# Patient Record
Sex: Female | Born: 1970
Health system: Southern US, Community
[De-identification: ages and names within clinical notes are randomized; demographics above are authoritative.]

## PROBLEM LIST (undated history)

## (undated) DIAGNOSIS — U071 COVID-19: Secondary | ICD-10-CM

## (undated) DIAGNOSIS — M069 Rheumatoid arthritis, unspecified: Secondary | ICD-10-CM

## (undated) DIAGNOSIS — R1013 Epigastric pain: Secondary | ICD-10-CM

## (undated) DIAGNOSIS — J309 Allergic rhinitis, unspecified: Secondary | ICD-10-CM

## (undated) DIAGNOSIS — K589 Irritable bowel syndrome without diarrhea: Secondary | ICD-10-CM

## (undated) DIAGNOSIS — E7800 Pure hypercholesterolemia, unspecified: Secondary | ICD-10-CM

## (undated) DIAGNOSIS — S39012A Strain of muscle, fascia and tendon of lower back, initial encounter: Secondary | ICD-10-CM

## (undated) HISTORY — DX: Allergic rhinitis, unspecified: J30.9

## (undated) HISTORY — DX: Epigastric pain: R10.13

## (undated) HISTORY — PX: CARPAL TUNNEL RELEASE: SHX101

## (undated) HISTORY — DX: Rheumatoid arthritis, unspecified: M06.9

## (undated) HISTORY — DX: Pure hypercholesterolemia, unspecified: E78.00

## (undated) HISTORY — DX: Irritable bowel syndrome, unspecified: K58.9

## (undated) HISTORY — DX: Strain of muscle, fascia and tendon of lower back, initial encounter: S39.012A

---

## 1999-01-04 ENCOUNTER — Emergency Department (HOSPITAL_COMMUNITY): Admission: EM | Admit: 1999-01-04 | Discharge: 1999-01-04 | Payer: Self-pay | Admitting: Emergency Medicine

## 1999-04-02 ENCOUNTER — Encounter (INDEPENDENT_AMBULATORY_CARE_PROVIDER_SITE_OTHER): Payer: Self-pay | Admitting: Specialist

## 1999-04-02 ENCOUNTER — Ambulatory Visit (HOSPITAL_COMMUNITY): Admission: RE | Admit: 1999-04-02 | Discharge: 1999-04-02 | Payer: Self-pay | Admitting: Gastroenterology

## 1999-07-23 ENCOUNTER — Ambulatory Visit (HOSPITAL_BASED_OUTPATIENT_CLINIC_OR_DEPARTMENT_OTHER): Admission: RE | Admit: 1999-07-23 | Discharge: 1999-07-23 | Payer: Self-pay | Admitting: Orthopedic Surgery

## 2000-02-14 ENCOUNTER — Encounter: Admission: RE | Admit: 2000-02-14 | Discharge: 2000-02-14 | Payer: Self-pay | Admitting: Pulmonary Disease

## 2000-02-14 ENCOUNTER — Encounter: Payer: Self-pay | Admitting: Pulmonary Disease

## 2002-08-22 ENCOUNTER — Ambulatory Visit (HOSPITAL_COMMUNITY): Admission: RE | Admit: 2002-08-22 | Discharge: 2002-08-22 | Payer: Self-pay | Admitting: Obstetrics and Gynecology

## 2002-08-22 ENCOUNTER — Encounter: Payer: Self-pay | Admitting: Obstetrics and Gynecology

## 2002-09-01 ENCOUNTER — Ambulatory Visit (HOSPITAL_COMMUNITY): Admission: RE | Admit: 2002-09-01 | Discharge: 2002-09-01 | Payer: Self-pay | Admitting: Obstetrics and Gynecology

## 2002-09-01 ENCOUNTER — Encounter: Payer: Self-pay | Admitting: Obstetrics and Gynecology

## 2002-10-19 ENCOUNTER — Ambulatory Visit (HOSPITAL_COMMUNITY): Admission: RE | Admit: 2002-10-19 | Discharge: 2002-10-19 | Payer: Self-pay | Admitting: Obstetrics and Gynecology

## 2002-10-20 ENCOUNTER — Ambulatory Visit: Admission: RE | Admit: 2002-10-20 | Discharge: 2002-10-20 | Payer: Self-pay | Admitting: Obstetrics and Gynecology

## 2003-01-03 ENCOUNTER — Encounter: Payer: Self-pay | Admitting: Obstetrics and Gynecology

## 2003-01-03 ENCOUNTER — Encounter: Admission: RE | Admit: 2003-01-03 | Discharge: 2003-01-03 | Payer: Self-pay | Admitting: Obstetrics and Gynecology

## 2003-01-03 ENCOUNTER — Ambulatory Visit (HOSPITAL_COMMUNITY): Admission: RE | Admit: 2003-01-03 | Discharge: 2003-01-03 | Payer: Self-pay | Admitting: Obstetrics and Gynecology

## 2003-01-06 ENCOUNTER — Encounter: Admission: RE | Admit: 2003-01-06 | Discharge: 2003-01-06 | Payer: Self-pay | Admitting: Obstetrics and Gynecology

## 2003-01-10 ENCOUNTER — Encounter: Admission: RE | Admit: 2003-01-10 | Discharge: 2003-01-10 | Payer: Self-pay | Admitting: Obstetrics and Gynecology

## 2003-01-12 ENCOUNTER — Encounter: Admission: RE | Admit: 2003-01-12 | Discharge: 2003-01-12 | Payer: Self-pay | Admitting: Obstetrics and Gynecology

## 2003-01-13 ENCOUNTER — Inpatient Hospital Stay (HOSPITAL_COMMUNITY): Admission: AD | Admit: 2003-01-13 | Discharge: 2003-01-16 | Payer: Self-pay | Admitting: Obstetrics and Gynecology

## 2003-01-13 ENCOUNTER — Encounter (INDEPENDENT_AMBULATORY_CARE_PROVIDER_SITE_OTHER): Payer: Self-pay

## 2005-08-01 ENCOUNTER — Ambulatory Visit: Payer: Self-pay | Admitting: Pulmonary Disease

## 2005-08-27 ENCOUNTER — Ambulatory Visit: Payer: Self-pay | Admitting: Pulmonary Disease

## 2005-10-01 ENCOUNTER — Ambulatory Visit: Payer: Self-pay | Admitting: Pulmonary Disease

## 2005-10-01 ENCOUNTER — Observation Stay (HOSPITAL_COMMUNITY): Admission: AD | Admit: 2005-10-01 | Discharge: 2005-10-02 | Payer: Self-pay | Admitting: Pulmonary Disease

## 2005-10-02 ENCOUNTER — Ambulatory Visit: Payer: Self-pay | Admitting: Pulmonary Disease

## 2005-10-15 ENCOUNTER — Ambulatory Visit: Payer: Self-pay | Admitting: Pulmonary Disease

## 2006-06-05 ENCOUNTER — Ambulatory Visit: Payer: Self-pay | Admitting: Internal Medicine

## 2006-06-23 ENCOUNTER — Ambulatory Visit: Payer: Self-pay | Admitting: Pulmonary Disease

## 2006-07-01 ENCOUNTER — Ambulatory Visit: Payer: Self-pay | Admitting: Pulmonary Disease

## 2006-08-21 ENCOUNTER — Ambulatory Visit: Payer: Self-pay | Admitting: Pulmonary Disease

## 2006-08-21 LAB — CONVERTED CEMR LAB
ALT: 17 units/L (ref 0–40)
AST: 16 units/L (ref 0–37)
Albumin: 4.2 g/dL (ref 3.5–5.2)
Alkaline Phosphatase: 37 units/L — ABNORMAL LOW (ref 39–117)
BUN: 9 mg/dL (ref 6–23)
CO2: 27 meq/L (ref 19–32)
Calcium: 9 mg/dL (ref 8.4–10.5)
Chloride: 106 meq/L (ref 96–112)
Chol/HDL Ratio, serum: 3.5
Cholesterol: 216 mg/dL (ref 0–200)
Creatinine, Ser: 0.8 mg/dL (ref 0.4–1.2)
GFR calc non Af Amer: 87 mL/min
Glomerular Filtration Rate, Af Am: 105 mL/min/{1.73_m2}
Glucose, Bld: 94 mg/dL (ref 70–99)
HDL: 62.5 mg/dL (ref >39.0)
LDL DIRECT: 140.7 mg/dL
Potassium: 4.2 meq/L (ref 3.5–5.1)
Sodium: 139 meq/L (ref 135–145)
Total Bilirubin: 1.1 mg/dL (ref 0.3–1.2)
Total Protein: 6.6 g/dL (ref 6.0–8.3)
Triglyceride fasting, serum: 65 mg/dL (ref 0–149)
VLDL: 13 mg/dL (ref 0–40)

## 2006-08-26 ENCOUNTER — Ambulatory Visit: Payer: Self-pay | Admitting: Pulmonary Disease

## 2007-07-13 ENCOUNTER — Ambulatory Visit: Payer: Self-pay | Admitting: Pulmonary Disease

## 2007-07-13 LAB — CONVERTED CEMR LAB
ALT: 19 units/L (ref 0–35)
AST: 25 units/L (ref 0–37)
Albumin: 3.8 g/dL (ref 3.5–5.2)
Alkaline Phosphatase: 32 units/L — ABNORMAL LOW (ref 39–117)
BUN: 9 mg/dL (ref 6–23)
CO2: 26 meq/L (ref 19–32)
Calcium: 8.9 mg/dL (ref 8.4–10.5)
Chloride: 107 meq/L (ref 96–112)
Cholesterol: 204 mg/dL (ref 0–200)
Creatinine, Ser: 0.7 mg/dL (ref 0.4–1.2)
Direct LDL: 123.2 mg/dL
GFR calc Af Amer: 122 mL/min
GFR calc non Af Amer: 101 mL/min
Glucose, Bld: 100 mg/dL — ABNORMAL HIGH (ref 70–99)
HDL: 71.4 mg/dL (ref >39.0)
Potassium: 4.2 meq/L (ref 3.5–5.1)
Sodium: 140 meq/L (ref 135–145)
Total Bilirubin: 0.7 mg/dL (ref 0.3–1.2)
Total CHOL/HDL Ratio: 2.9
Total Protein: 6.8 g/dL (ref 6.0–8.3)
Triglycerides: 72 mg/dL (ref 0–149)
VLDL: 14 mg/dL (ref 0–40)

## 2007-12-25 ENCOUNTER — Encounter: Payer: Self-pay | Admitting: Pulmonary Disease

## 2007-12-25 DIAGNOSIS — K589 Irritable bowel syndrome without diarrhea: Secondary | ICD-10-CM | POA: Insufficient documentation

## 2007-12-25 DIAGNOSIS — K5289 Other specified noninfective gastroenteritis and colitis: Secondary | ICD-10-CM | POA: Insufficient documentation

## 2008-01-21 ENCOUNTER — Ambulatory Visit: Payer: Self-pay | Admitting: Internal Medicine

## 2008-04-24 ENCOUNTER — Ambulatory Visit: Payer: Self-pay | Admitting: Pulmonary Disease

## 2008-04-25 LAB — CONVERTED CEMR LAB
ALT: 16 units/L (ref 0–35)
AST: 20 units/L (ref 0–37)
Albumin: 3.9 g/dL (ref 3.5–5.2)
Alkaline Phosphatase: 29 units/L — ABNORMAL LOW (ref 39–117)
BUN: 7 mg/dL (ref 6–23)
Basophils Absolute: 0.1 10*3/uL (ref 0.0–0.1)
Basophils Relative: 0.6 % (ref 0.0–3.0)
Bilirubin, Direct: 0.1 mg/dL (ref 0.0–0.3)
CO2: 28 meq/L (ref 19–32)
Calcium: 9.1 mg/dL (ref 8.4–10.5)
Chloride: 111 meq/L (ref 96–112)
Creatinine, Ser: 0.7 mg/dL (ref 0.4–1.2)
Eosinophils Absolute: 0 10*3/uL (ref 0.0–0.7)
Eosinophils Relative: 0.4 % (ref 0.0–5.0)
GFR calc Af Amer: 122 mL/min
GFR calc non Af Amer: 101 mL/min
Glucose, Bld: 90 mg/dL (ref 70–99)
HCT: 38.2 % (ref 36.0–46.0)
Hemoglobin: 13.4 g/dL (ref 12.0–15.0)
Lymphocytes Relative: 18.6 % (ref 12.0–46.0)
MCHC: 35.2 g/dL (ref 30.0–36.0)
MCV: 92.5 fL (ref 78.0–100.0)
Monocytes Absolute: 0.5 10*3/uL (ref 0.1–1.0)
Monocytes Relative: 5.4 % (ref 3.0–12.0)
Neutro Abs: 7.3 10*3/uL (ref 1.4–7.7)
Neutrophils Relative %: 75 % (ref 43.0–77.0)
Platelets: 263 10*3/uL (ref 150–400)
Potassium: 4.1 meq/L (ref 3.5–5.1)
RBC: 4.13 M/uL (ref 3.87–5.11)
RDW: 11.5 % (ref 11.5–14.6)
Sodium: 141 meq/L (ref 135–145)
Total Bilirubin: 0.8 mg/dL (ref 0.3–1.2)
Total Protein: 6.7 g/dL (ref 6.0–8.3)
WBC: 9.7 10*3/uL (ref 4.5–10.5)

## 2009-02-19 ENCOUNTER — Ambulatory Visit: Payer: Self-pay | Admitting: Pulmonary Disease

## 2009-05-09 ENCOUNTER — Ambulatory Visit: Payer: Self-pay | Admitting: Internal Medicine

## 2009-05-09 DIAGNOSIS — J069 Acute upper respiratory infection, unspecified: Secondary | ICD-10-CM | POA: Insufficient documentation

## 2009-05-10 LAB — CONVERTED CEMR LAB: Streptococcus, Group A Screen (Direct): NEGATIVE

## 2009-05-15 DIAGNOSIS — J309 Allergic rhinitis, unspecified: Secondary | ICD-10-CM | POA: Insufficient documentation

## 2009-07-12 ENCOUNTER — Ambulatory Visit: Payer: Self-pay | Admitting: Adult Health

## 2009-07-13 LAB — CONVERTED CEMR LAB
ALT: 24 units/L (ref 0–35)
AST: 24 units/L (ref 0–37)
Albumin: 4.5 g/dL (ref 3.5–5.2)
Alkaline Phosphatase: 42 units/L (ref 39–117)
BUN: 11 mg/dL (ref 6–23)
Basophils Absolute: 0 10*3/uL (ref 0.0–0.1)
Basophils Relative: 0.1 % (ref 0.0–3.0)
Bilirubin Urine: NEGATIVE
Bilirubin, Direct: 0.1 mg/dL (ref 0.0–0.3)
CO2: 26 meq/L (ref 19–32)
Calcium: 9.3 mg/dL (ref 8.4–10.5)
Chloride: 105 meq/L (ref 96–112)
Cholesterol: 192 mg/dL (ref 0–200)
Creatinine, Ser: 0.8 mg/dL (ref 0.4–1.2)
Eosinophils Absolute: 0.1 10*3/uL (ref 0.0–0.7)
Eosinophils Relative: 0.9 % (ref 0.0–5.0)
GFR calc non Af Amer: 85.24 mL/min (ref >60)
Glucose, Bld: 94 mg/dL (ref 70–99)
HCT: 38.8 % (ref 36.0–46.0)
HDL: 60.3 mg/dL (ref >39.00)
Hemoglobin: 13.5 g/dL (ref 12.0–15.0)
Ketones, ur: NEGATIVE mg/dL
LDL Cholesterol: 120 mg/dL — ABNORMAL HIGH (ref 0–99)
Leukocytes, UA: NEGATIVE
Lymphocytes Relative: 14.2 % (ref 12.0–46.0)
Lymphs Abs: 1.2 10*3/uL (ref 0.7–4.0)
MCHC: 34.7 g/dL (ref 30.0–36.0)
MCV: 96.7 fL (ref 78.0–100.0)
Monocytes Absolute: 0.5 10*3/uL (ref 0.1–1.0)
Monocytes Relative: 5.4 % (ref 3.0–12.0)
Neutro Abs: 6.9 10*3/uL (ref 1.4–7.7)
Neutrophils Relative %: 79.4 % — ABNORMAL HIGH (ref 43.0–77.0)
Nitrite: NEGATIVE
Platelets: 208 10*3/uL (ref 150.0–400.0)
Potassium: 4.3 meq/L (ref 3.5–5.1)
RBC: 4.01 M/uL (ref 3.87–5.11)
RDW: 11.5 % (ref 11.5–14.6)
Sodium: 139 meq/L (ref 135–145)
Specific Gravity, Urine: 1.02 (ref 1.000–1.030)
TSH: 1.15 microintl units/mL (ref 0.35–5.50)
Total Bilirubin: 1.2 mg/dL (ref 0.3–1.2)
Total CHOL/HDL Ratio: 3
Total Protein, Urine: NEGATIVE mg/dL
Total Protein: 7.1 g/dL (ref 6.0–8.3)
Triglycerides: 59 mg/dL (ref 0.0–149.0)
Urine Glucose: NEGATIVE mg/dL
Urobilinogen, UA: 0.2 (ref 0.0–1.0)
VLDL: 11.8 mg/dL (ref 0.0–40.0)
WBC: 8.7 10*3/uL (ref 4.5–10.5)
pH: 5 (ref 5.0–8.0)

## 2009-11-10 ENCOUNTER — Emergency Department (HOSPITAL_BASED_OUTPATIENT_CLINIC_OR_DEPARTMENT_OTHER): Admission: EM | Admit: 2009-11-10 | Discharge: 2009-11-10 | Payer: Self-pay | Admitting: Emergency Medicine

## 2010-05-14 ENCOUNTER — Ambulatory Visit: Payer: Self-pay | Admitting: Internal Medicine

## 2010-05-14 ENCOUNTER — Ambulatory Visit: Payer: Self-pay | Admitting: Pulmonary Disease

## 2010-05-14 DIAGNOSIS — S339XXA Sprain of unspecified parts of lumbar spine and pelvis, initial encounter: Secondary | ICD-10-CM | POA: Insufficient documentation

## 2010-05-14 DIAGNOSIS — E7800 Pure hypercholesterolemia, unspecified: Secondary | ICD-10-CM | POA: Insufficient documentation

## 2010-05-14 DIAGNOSIS — R1013 Epigastric pain: Secondary | ICD-10-CM

## 2010-05-14 DIAGNOSIS — S335XXA Sprain of ligaments of lumbar spine, initial encounter: Secondary | ICD-10-CM

## 2010-05-14 DIAGNOSIS — K3189 Other diseases of stomach and duodenum: Secondary | ICD-10-CM | POA: Insufficient documentation

## 2010-09-13 ENCOUNTER — Other Ambulatory Visit: Payer: Self-pay | Admitting: Pulmonary Disease

## 2010-09-13 ENCOUNTER — Ambulatory Visit
Admission: RE | Admit: 2010-09-13 | Discharge: 2010-09-13 | Payer: Self-pay | Source: Home / Self Care | Attending: Pulmonary Disease | Admitting: Pulmonary Disease

## 2010-09-13 LAB — HEPATIC FUNCTION PANEL
ALT: 17 U/L (ref 0–35)
AST: 19 U/L (ref 0–37)
Albumin: 4.3 g/dL (ref 3.5–5.2)
Alkaline Phosphatase: 38 U/L — ABNORMAL LOW (ref 39–117)
Bilirubin, Direct: 0.1 mg/dL (ref 0.0–0.3)
Total Bilirubin: 0.9 mg/dL (ref 0.3–1.2)
Total Protein: 6.6 g/dL (ref 6.0–8.3)

## 2010-09-13 LAB — BASIC METABOLIC PANEL
BUN: 11 mg/dL (ref 6–23)
CO2: 26 mEq/L (ref 19–32)
Calcium: 9.2 mg/dL (ref 8.4–10.5)
Chloride: 106 mEq/L (ref 96–112)
Creatinine, Ser: 0.7 mg/dL (ref 0.4–1.2)
GFR: 100.48 mL/min (ref >60.00)
Glucose, Bld: 86 mg/dL (ref 70–99)
Potassium: 4.6 mEq/L (ref 3.5–5.1)
Sodium: 139 mEq/L (ref 135–145)

## 2010-09-13 LAB — CBC WITH DIFFERENTIAL/PLATELET
Basophils Absolute: 0 10*3/uL (ref 0.0–0.1)
Basophils Relative: 0.9 % (ref 0.0–3.0)
Eosinophils Absolute: 0.1 10*3/uL (ref 0.0–0.7)
Eosinophils Relative: 1.4 % (ref 0.0–5.0)
HCT: 38.5 % (ref 36.0–46.0)
Hemoglobin: 13.4 g/dL (ref 12.0–15.0)
Lymphocytes Relative: 30.6 % (ref 12.0–46.0)
Lymphs Abs: 1.5 10*3/uL (ref 0.7–4.0)
MCHC: 34.9 g/dL (ref 30.0–36.0)
MCV: 95.3 fl (ref 78.0–100.0)
Monocytes Absolute: 0.4 10*3/uL (ref 0.1–1.0)
Monocytes Relative: 8.7 % (ref 3.0–12.0)
Neutro Abs: 2.9 10*3/uL (ref 1.4–7.7)
Neutrophils Relative %: 58.4 % (ref 43.0–77.0)
Platelets: 200 10*3/uL (ref 150.0–400.0)
RBC: 4.04 Mil/uL (ref 3.87–5.11)
RDW: 12.3 % (ref 11.5–14.6)
WBC: 4.9 10*3/uL (ref 4.5–10.5)

## 2010-09-13 LAB — TSH: TSH: 1.3 u[IU]/mL (ref 0.35–5.50)

## 2010-09-13 LAB — URINALYSIS
Bilirubin Urine: NEGATIVE
Hemoglobin, Urine: NEGATIVE
Ketones, ur: NEGATIVE
Leukocytes, UA: NEGATIVE
Nitrite: NEGATIVE
Specific Gravity, Urine: <=1.005 (ref 1.000–1.030)
Total Protein, Urine: NEGATIVE
Urine Glucose: NEGATIVE
Urobilinogen, UA: 0.2 (ref 0.0–1.0)
pH: 7 (ref 5.0–8.0)

## 2010-09-13 LAB — LIPID PANEL
Cholesterol: 185 mg/dL (ref 0–200)
HDL: 65.1 mg/dL (ref >39.00)
LDL Cholesterol: 111 mg/dL — ABNORMAL HIGH (ref 0–99)
Total CHOL/HDL Ratio: 3
Triglycerides: 46 mg/dL (ref 0.0–149.0)
VLDL: 9.2 mg/dL (ref 0.0–40.0)

## 2010-09-24 NOTE — Assessment & Plan Note (Signed)
Summary: cpx/jd   CC:  Yearly CPX...  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 I last saw Natalie Ford 2/07- good general health> hx AR, IBS, anxiety on no regular meds... she saw TP last yr- dong well, labs all normal x LDL= 120 on diet Rx alone... she has been running 2-71mi/d 4-5d per wk & weight down 5# to 122# (59" tall & BMI  ~25)... no new complaints or concerns; she is working on her BSN & will be completing her classes at [employer]...   Current Problems:   HEALTH MAINTENANCE EXAM (ICD-V70.0)  ~  GI:  she had a colonoscopy 2000 by DrStark because of diarrhea prob at that time... the colon was neg (sl tortuous) & rec for hi fiber diet.  ~  GYN:  DrHenley for PAP, Mammograms, etc...  ~  Immunizations:  she is up to date w/ Flu shot yearly thru [facility] & had Tetanus shot  ~2000 (she will inquire re 87yr update vaccination at work)...  ALLERGIC RHINITIS (ICD-477.9) - she takes OTC antihist Prn...  HYPERCHOLESTEROLEMIA, BORDERLINE (ICD-272.4) - prev on Lipitor Rx, she prefers to control w/ diet alone...  ~  FLP 1/07 showed TChol 244, TG 63, HDL 73, LDL 156... rec> Lip10 but pt stopped on her own.  ~  FLP 11/08 showed TChol 204, TG 72, HDL 71, LDL 123  ~  FLP 11/10 showed TChol 192, TG 59, HDL 60, LDL 120  ~  FLP 9/11 showed TChol   Hx of DYSPEPSIA (ICD-536.8) - she uses OTC H2 blockers Prn...  IRRITABLE BOWEL SYNDROME (ICD-564.1) - hx IBS w/ diarrhea & eval 2000 by DrStark> colonoscopy was neg x tortuous colon & rec for incr fiber & Bentyl Prn...  ~  she has had 2 severe gastroenteritis bouts one in 2007 w/ overnight hosp for IVF & the other in 2011 treated in ER similarly...  Hx of LUMBOSACRAL STRAIN (ICD-846.0)   Preventive Screening-Counseling & Management  Alcohol-Tobacco     Smoking Status: never  Allergies: 1)  ! Penicillin  Comments:  Nurse/Medical Assistant: The patient's medications and allergies were  reviewed with the patient and were updated in the Medication and Allergy Lists.  Past History:  Past Medical History: ALLERGIC RHINITIS (ICD-477.9) HYPERCHOLESTEROLEMIA, BORDERLINE (ICD-272.4) Hx of DYSPEPSIA (ICD-536.8) IRRITABLE BOWEL SYNDROME (ICD-564.1) Hx of LUMBOSACRAL STRAIN (ICD-846.0)  Family History: Reviewed history from 02/19/2009 and no changes required. mother is brodelrine diabetic, controls with diet, also has allergies, astham and heart disease and COPD sister has allergies, thyroid  father has leukemia, had a blood clot after back surgery PGF had stomach cancer  Social History: Reviewed history from 05/09/2009 and no changes required. never smoked positive for second hand smoke exposure reportedly exercises 4x week states drinks 1-2 cups caffeine a day married 2 children alcohol socially in school for BSN  Review of Systems  The patient denies fever, chills, sweats, anorexia, fatigue, weakness, malaise, weight loss, sleep disorder, blurring, diplopia, eye irritation, eye discharge, vision loss, eye pain, photophobia, earache, ear discharge, tinnitus, decreased hearing, nasal congestion, nosebleeds, sore throat, hoarseness, chest pain, palpitations, syncope, dyspnea on exertion, orthopnea, PND, peripheral edema, cough, dyspnea at rest, excessive sputum, hemoptysis, wheezing, pleurisy, nausea, vomiting, diarrhea, constipation, change in bowel habits, abdominal pain, melena, hematochezia, jaundice, gas/bloating, indigestion/heartburn, dysphagia, odynophagia, dysuria, hematuria, urinary frequency, urinary hesitancy, nocturia, incontinence, back pain, joint pain, joint swelling, muscle cramps, muscle weakness, stiffness, arthritis, sciatica,  restless legs, leg pain at night, leg pain with exertion, rash, itching, dryness, suspicious lesions, paralysis, paresthesias, seizures, tremors, vertigo, transient blindness, frequent falls, frequent headaches, difficulty walking,  depression, anxiety, memory loss, confusion, cold intolerance, heat intolerance, polydipsia, polyphagia, polyuria, unusual weight change, abnormal bruising, bleeding, enlarged lymph nodes, urticaria, allergic rash, hay fever, and recurrent infections.    Vital Signs:  Patient profile:   40 year old female Height:      59 inches Weight:      121.50 pounds BMI:     24.63 O2 Sat:      100 % on Room air Temp:     99.5 degrees F oral Pulse rate:   70 / minute BP sitting:   112 / 74  (left arm) Cuff size:   regular  Vitals Entered By: Randell Loop CMA (May 14, 2010 12:00 PM)  O2 Sat at Rest %:  100 O2 Flow:  Room air CC: Yearly CPX.. Is Patient Diabetic? No Pain Assessment Patient in pain? no      Comments meds updated today with pt   Physical Exam  Additional Exam:  WD, WN, 40 y/o WF in NAD... GENERAL:  Alert & oriented; pleasant & cooperative... HEENT:  [place]/AT, EOM-wnl, PERRLA, Fundi-benign, EACs-clear, TMs-wnl, NOSE-clear, THROAT-clear & wnl. NECK:  Supple w/ full ROM; no JVD; normal carotid impulses w/o bruits; no thyromegaly or nodules palpated; no lymphadenopathy. CHEST:  Clear to P & A; without wheezes/ rales/ or rhonchi. HEART:  Regular Rhythm; without murmurs/ rubs/ or gallops. ABDOMEN:  Soft & nontender; normal bowel sounds; no organomegaly or masses detected. EXT: without deformities or arthritic changes; no varicose veins/ venous insuffic/ or edema. NEURO:  CN's intact; motor testing normal; sensory testing normal; gait normal & balance OK. DERM:  No lesions noted; no rash etc...    CXR  Procedure date:  05/14/2010  Findings:      CHEST - 2 VIEW Comparison: 07/01/2006   Findings: Midline trachea.  Heart size upper limits of normal. Mediastinal contours otherwise within normal limits.  No pleural effusion or pneumothorax.  Clear lungs.   IMPRESSION:   1. No acute cardiopulmonary disease. 2.  Heart size upper limits of normal.   Read By:  Consuello Bossier,  M.D.   Impression & Recommendations:  Problem # 1:  HEALTH MAINTENANCE EXAM (ICD-V70.0) Good general health>> no perscription meds... Orders: 12 Lead EKG (12 Lead EKG) T-2 View CXR (71020TC)  Problem # 2:  ALLERGIC RHINITIS (ICD-477.9) Stable w/ OTC meds Prn... The following medications were removed from the medication list:    Claritin 10 Mg Tabs (Loratadine) .[name]... Take 1 tablet by mouth once a day as needed    Sudafed 30 Mg Tabs (Pseudoephedrine hcl) .[name]... Per package    Saline Nasal Spray 0.65 % Soln (Saline) .[name]... 2 sprays each nostril as needed  Problem # 3:  HYPERCHOLESTEROLEMIA, BORDERLINE (ICD-272.4) We discussed low chol/ low fat diet, etc...  Problem # 4:  IRRITABLE BOWEL SYNDROME (ICD-564.1) No recurrent symptoms at this time> hx diarrhea & we discussed fiber options...  Complete Medication List: 1)  Calcium Carbonate-vitamin D 600-400 Mg-unit Tabs (Calcium carbonate-vitamin d) .... Take 1 tablet by mouth once a day 2)  Multivitamins Tabs (Multiple vitamin) .... Take 1 tablet by mouth once a day 3)  Tylenol 325 Mg Tabs (Acetaminophen) .... Per bottle  Patient Instructions: 1)  Today we updated your med list- see below.... 2)  Today we did your follow up CXR &  EKG... 3)  Please return to our lab one morning for your FASTING blood work... then please call the "phone tree" in a few days for your lab results.Marland KitchenMarland Kitchen  4)  Natalie Ford, you look great- call for any problems.Marland KitchenMarland Kitchen 5)  Please schedule a follow-up appointment in 1 year.   Immunization History:  Influenza Immunization History:    Influenza:  historical (06/04/2009)

## 2010-09-30 ENCOUNTER — Telehealth (INDEPENDENT_AMBULATORY_CARE_PROVIDER_SITE_OTHER): Payer: Self-pay | Admitting: *Deleted

## 2010-10-10 NOTE — Progress Notes (Signed)
Summary: Test results  Phone Note Call from Patient Call back at Home Phone [phone]   Caller: Patient Reason for Call: Lab or Test Results Summary of Call: Patient wants lab test results. Initial call taken by: Leonette Monarch,  September 30, 2010 1:59 PM  Follow-up for Phone Call        Pt calling for lab results from 1/20- still unsigned so I printed them along with this msg. Pls advise thanks! Follow-up by: Vernie Murders,  September 30, 2010 3:06 PM  Additional Follow-up for Phone Call Additional follow up Details #1::        Per SN- Labs look great, mail her a copy.  I called pt and had to [facility]  September 30, 2010 4:02 PM     Additional Follow-up for Phone Call Additional follow up Details #2::    Spoke with pt and notified of the above per SN.  Pt verbalized understanding.  I verified her home address and mailed her a copy of labs.  Follow-up by: Vernie Murders,  September 30, 2010 4:22 PM

## 2010-11-18 LAB — CBC
HCT: 42.7 % (ref 36.0–46.0)
Hemoglobin: 14.8 g/dL (ref 12.0–15.0)
MCHC: 34.5 g/dL (ref 30.0–36.0)
MCV: 93.5 fL (ref 78.0–100.0)
Platelets: 211 10*3/uL (ref 150–400)
RBC: 4.57 MIL/uL (ref 3.87–5.11)
RDW: 11.8 % (ref 11.5–15.5)
WBC: 11 10*3/uL — ABNORMAL HIGH (ref 4.0–10.5)

## 2010-11-18 LAB — BASIC METABOLIC PANEL
BUN: 11 mg/dL (ref 6–23)
CO2: 29 mEq/L (ref 19–32)
Calcium: 9.6 mg/dL (ref 8.4–10.5)
Chloride: 105 mEq/L (ref 96–112)
Creatinine, Ser: 0.7 mg/dL (ref 0.4–1.2)
GFR calc Af Amer: >60 mL/min (ref >60)
GFR calc non Af Amer: >60 mL/min (ref >60)
Glucose, Bld: 99 mg/dL (ref 70–99)
Potassium: 3.9 mEq/L (ref 3.5–5.1)
Sodium: 144 mEq/L (ref 135–145)

## 2010-11-18 LAB — DIFFERENTIAL
Basophils Absolute: 0.1 10*3/uL (ref 0.0–0.1)
Basophils Relative: 1 % (ref 0–1)
Eosinophils Absolute: 0 10*3/uL (ref 0.0–0.7)
Eosinophils Relative: 0 % (ref 0–5)
Lymphocytes Relative: 6 % — ABNORMAL LOW (ref 12–46)
Lymphs Abs: 0.7 10*3/uL (ref 0.7–4.0)
Monocytes Absolute: 0.7 10*3/uL (ref 0.1–1.0)
Monocytes Relative: 7 % (ref 3–12)
Neutro Abs: 9.5 10*3/uL — ABNORMAL HIGH (ref 1.7–7.7)
Neutrophils Relative %: 86 % — ABNORMAL HIGH (ref 43–77)

## 2010-11-18 LAB — URINALYSIS, ROUTINE W REFLEX MICROSCOPIC
Glucose, UA: NEGATIVE mg/dL
Hgb urine dipstick: NEGATIVE
Ketones, ur: >80 mg/dL — AB
Leukocytes, UA: NEGATIVE
Nitrite: NEGATIVE
Protein, ur: 30 mg/dL — AB
Specific Gravity, Urine: 1.029 (ref 1.005–1.030)
Urobilinogen, UA: 1 mg/dL (ref 0.0–1.0)
pH: 6.5 (ref 5.0–8.0)

## 2010-11-18 LAB — URINE MICROSCOPIC-ADD ON

## 2010-11-18 LAB — PREGNANCY, URINE: Preg Test, Ur: NEGATIVE

## 2011-01-10 NOTE — Discharge Summary (Signed)
Ford, Natalie             ACCOUNT NO.:  [id number]   MEDICAL RECORD NO.:  [id number]          PATIENT TYPE:  OBV   LOCATION:  5703                         FACILITY:  [facility]   PHYSICIAN:  Lonzo Cloud. Kriste Basque, M.D. [facility] OF BIRTH:  [birth year]   DATE OF ADMISSION:  10/01/2005  DATE OF DISCHARGE:  10/02/2005                                 DISCHARGE SUMMARY   FINAL DIAGNOSES:  1.  Admitted October 01, 2005 for observation due to severe nausea, vomiting      and diarrhea. Evaluation reveals probable viral gastroenteritis with      slow resolution.  2.  History of mild hypercholesterolemia recently started on Lipitor      therapy.  3.  History of irritable bowel syndrome in the past with negative      colonoscopy in 2000.  4.  History of anxiety recently on Effexor with good control.   ALLERGIES:  PENICILLIN.   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 She presented with a one-day history of severe nausea, vomiting  and profuse watery diarrhea with extreme weakness, dizziness, malaise, etc.  She had dinner at a friend's house the night prior to admission, awoke in  the middle of the night with nausea, vomiting followed by profuse watery  diarrhea. She felt terrible all day with fever to 100, chills and malaise  and progressive weakness with postural symptoms. She was unable to keep  anything down and had continued diarrhea. She presented to the office where  blood pressure was 100/60, pulse 100 and regular. She was weak and quite  postural and was admitted for IV fluids and observation.   Dalia has enjoyed good general health. Her recent physical showed some mild  hypercholesterolemia. She was started on Lipitor. She has a history of  irritable bowel syndrome in the past with a negative colonoscopy in 2000.  She has been under some recent stress and was started on  Effexor with good  control. She is allergic to PENICILLIN. Her admission medications included  Lipitor 20 milligrams p.o. daily and Effexor 75 milligrams p.o. q.h.s.   PHYSICAL EXAMINATION:  Physical exam revealed a 40 year old white female who  was feeling quite ill. Blood pressure 98/60, pulse 120 and regular,  respirations 18 per minute and not labored, O2 sat 96% on room air,  temperature 100.5 orally. HEENT exam revealed dry mucous membranes. Neck  exam showed no jugular distension, no carotid bruits, no thyromegaly or  lymphadenopathy. Chest exam was clear to percussion and auscultation.  Cardiac exam revealed a regular rhythm. A grade 1/6 systolic ejection murmur  at the left sternal border. No rubs or gallops heard. The abdomen was soft  with minimal midabdominal and left lower quadrant tenderness. There was no  guarding or rebound. Bowel sounds were increased. Extremities showed no  cyanosis, clubbing or edema. Neurologic exam was intact. Skin exam was  negative.   LABORATORY DATA:  CBC showed hemoglobin 13.1, hematocrit 37.1, white count  9800 with 91% segs; platelet count  was 227,000. Sodium 137, potassium 3.4,  chloride 106, CO2 27, BUN 7, creatinine 0.7, blood sugar 135, bilirubin 1.0,  alk phos 51, SGOT 26, SGPT 26, total protein 6.0, albumin 3.7, calcium 8.5.  Sed rate was 7.   HOSPITAL COURSE:  The patient was admitted for IV fluid hydration and  symptomatic treatment. She was presumed to have a severe viral  gastroenteritis. She was given Phenergan for nausea, Lomotil for the  diarrhea and Tylenol for fever. She was started on IV fluids with some  potassium. She received 2-1/2 liters over the 24 hours in the hospital. She  felt somewhat better with resolution of her nausea and vomiting but had some  continued watery diarrhea. She was given clear liquids and was able to keep  down some Jello and Sprite. She was felt to be MHB and ready for discharge  from observation  status.   MEDICATIONS AT DISCHARGE:  1.  Phenergan 25 milligrams p.o. q.4h. p.r.n. nausea.  2.  Lomotil 1 tablet p.o. q.4h. p.r.n. watery diarrhea.  3.  Lipitor 10 milligrams p.o. q.h.s.  4.  Effexor 75 milligrams p.o. daily. as before.   The patient will rest at home, stay on a clear liquid diet and gradually  increase her intake and activities and call for any problems.   CONDITION ON DISCHARGE:  Improved.      Lonzo Cloud. Kriste Basque, M.D. [facility]  Electronically Signed     SMN/MEDQ  D:  10/02/2005  T:  10/02/2005  Job:  [id number]

## 2011-01-10 NOTE — Assessment & Plan Note (Signed)
[facility]                               PULMONARY OFFICE NOTE   NAME:SCROGGINSToshi, Ishii                      MRN:          [id number]  DATE:06/05/2006                            DOB:          16-Oct-1970    HISTORY OF PRESENT ILLNESS:  Patient is a 40 year old white female patient  of Dr Kriste Basque, who presents for an acute office visit. Patient complains of a  seven day history of productive cough with thick mucus, fevers with a  temperature max of 101 and general malaise. She has been using over-the-  counter cough and cold products. She denies any hemoptysis, chest pain,  orthopnea PND, or leg swelling. No recent travel or antibiotic use.  Menstrual cycle was one week ago and was regular.   PHYSICAL EXAMINATION:  GENERAL: Patient is a pleasant female in no acute  distress. She is afebrile and has stable vital signs. Room air saturation is  98% on room air.  HEENT: Nasal mucosa with some mild erythema.  Nontender to __________  NECK: Supple without adenopathy, no jugular vein distention.  LUNGS: Sounds are clear.  CARDIAC: Regular rate and rhythm.  ABDOMEN: Soft and benign.  EXTREMITIES: Warm without and edema.   IMPRESSION AND PLAN:  Acute upper respiratory infection, patient is to begin  Omnicef x7 days. Mucinex DM twice daily. Nasal hygiene regimen with saline  and Afrin nasal spray x5 days. Patient may use Tylenol Cold & Sinus, as  needed. Patient is to return as scheduled or sooner if needed.      ______________________________  Rubye Oaks, NP    ______________________________  Lonzo Cloud. Kriste Basque, MD     TP/MedQ  DD:  06/05/2006  DT:  06/08/2006  Job #:  [id number]

## 2011-01-10 NOTE — Assessment & Plan Note (Signed)
[facility]                               PULMONARY OFFICE NOTE   NAME:Natalie Ford, Natalie Ford                      MRN:          [id number]  DATE:06/23/2006                            DOB:          1971/03/12    HISTORY OF PRESENT ILLNESS:  Patient is a 40 year old white female patient  of Dr. Jodelle Green, who returns for a persistent cough and congestion.  Patient  was seen approximately two weeks ago for an upper respiratory infection and  treated with a seven day course of Omnicef and Mucinex DM.  Patient returns  today reporting that she has improved; however, her cough is not totally  resolved, and she continues to have low-grade fever, coughing at bedtime,  and no energy.  Patient denies any hemoptysis, chest pain, orthopnea, PND,  or leg swelling.  Patient has used Mucinex DM, but it has not completely  resolved her cough.  Patient also reports that she is under significant  stress at home and seems to be more run-down than usual.   PAST MEDICAL HISTORY:  Reviewed.   CURRENT MEDICATIONS:  Reviewed.   PHYSICAL EXAMINATION:  GENERAL:  Patient is a pleasant female in no acute  distress.  VITAL SIGNS:  She is afebrile with stable vital signs.  O2 saturation is 98%  on room air.  HEENT:  Unremarkable.  NECK:  Supple without cervical adenopathy.  No JVD.  LUNGS:  The lung sounds revealed coarse breath sounds bilaterally.  CARDIAC:  S1 and S2 without murmur, rub or gallop.  ABDOMEN:  Soft without any hepatosplenomegaly.  No guarding or rebound  noted.  EXTREMITIES:  Warm without any calf tenderness, cyanosis, clubbing, or  edema.  NEURO:  Intact.   IMPRESSION/PLAN:  Acute tracheobronchitis, slow to resolve.  Chest x-ray is  pending at the time of dictation.  Patient is to continue on Mucinex DM  twice daily, Tylenol as needed for fever.  Patient will begin a prednisone  taper over the next week and may use Endal HD 1-2 teaspoons every 4-6 hours  as  needed for cough, #8 ounces was given without refills.  Patient is aware  of sedating effects.  Patient is to return here as scheduled with Dr. Kriste Basque  or sooner if needed.     ______________________________  Natalie Oaks, NP    ______________________________  Lonzo Cloud. Kriste Basque, MD   TP/MedQ  DD: 06/23/2006  DT: 06/23/2006  Job #: [id number]

## 2011-01-10 NOTE — Discharge Summary (Signed)
   NAME:  Natalie Ford, Natalie Ford                       ACCOUNT NO.:  [id number]   MEDICAL RECORD NO.:  [id number]                   PATIENT TYPE:  INP   LOCATION:  9137                                 FACILITY:  [facility]   PHYSICIAN:  Malachi Pro. Ambrose Mantle, M.D.              DATE OF BIRTH:  1970/10/03   DATE OF ADMISSION:  01/13/2003  DATE OF DISCHARGE:  01/16/2003                                 DISCHARGE SUMMARY   HISTORY OF PRESENT ILLNESS:  This is a 40 year old white female para 0-1-0-1  gravida 2; EDC Jan 20, 2003; admitted for repeat C section.  She declined a  VBAC.  Blood group and type A negative, negative antibody.  Nonreactive  serology, rubella immune, hepatitis B surface antigen negative, HIV  negative, GC and chlamydia negative, triple screen normal, group B strep  negative, one-hour Glucola 108.   HOSPITAL COURSE:  The patient underwent a low transverse cervical cesarean  section by Dr. Ambrose Mantle with Dr. Jackelyn Knife assisting under spinal anesthesia  on Jan 13, 2003.  The delivery was of a female infant, 6 pounds 13 ounces,  Apgars of 8 at one and 9 at five minutes.  Postpartum the patient did quite  well.  She had no significant problems and she was ready for discharge on  postoperative day #3, ambulating well without difficulty, voiding well,  passing flatus, staples removed, strips were applied.   LABORATORY DATA:  Initial hemoglobin 13.3; hematocrit 38.8; white count  8100; platelet count 176,000.  Follow-up hemoglobin 9.9; hematocrit 28.8;  platelet count 136,000; white count 8700.  Comprehensive metabolic profile  on admission was normal except for a sodium of 131, glucose of 56, albumin  3.3, alkaline phosphatase 150.  Urinalysis was negative.  The patient was  checked; she was A negative, her antibody screen was positive, and she was  given RhoGAM on Jan 15, 2003.  RPR was nonreactive.  The positive antibody  screen was passively-acquired anti-D from RhoGAM earlier.   FINAL  DIAGNOSIS:  Intrauterine pregnancy at 39 weeks delivered by cesarean  section.  The patient declined a vaginal birth after cesarean.   OPERATION:  Low transverse cervical C section.   FINAL CONDITION:  Improved.   DISCHARGE INSTRUCTIONS:  Include our regular discharge instruction booklet.   MEDICATIONS:  Dr. Senaida Ores gave the patient prescriptions for Percocet  5/325 and Motrin 600 mg to take as needed for pain.   FOLLOW-UP:  To return to the office in 10 days for follow-up examination.                                               Malachi Pro. Ambrose Mantle, M.D.    TFH/MEDQ  D:  01/16/2003  T:  01/16/2003  Job:  [id number]

## 2011-01-10 NOTE — Op Note (Signed)
NAME:  Natalie Ford, Natalie Ford                       ACCOUNT NO.:  [id number]   MEDICAL RECORD NO.:  [id number]                   PATIENT TYPE:  INP   LOCATION:  9137                                 FACILITY:  [facility]   PHYSICIAN:  Malachi Pro. Ambrose Mantle, M.D.              DATE OF BIRTH:  July 23, 1971   DATE OF PROCEDURE:  01/13/2003  DATE OF DISCHARGE:                                 OPERATIVE REPORT   PREOPERATIVE DIAGNOSES:  1. Intrauterine pregnancy at 39 weeks.  2. Repeat cesarean section.  3. Declined vaginal birth after cesarean.   POSTOPERATIVE DIAGNOSES:  1. Intrauterine pregnancy at 39 weeks.  2. Repeat cesarean section.  3. Declined vaginal birth after cesarean.   PROCEDURE:  Low transverse cervical cesarean section.   SURGEON:  Malachi Pro. Ambrose Mantle, M.D.   ASSISTANT:  Zenaida Niece, M.D.   ANESTHESIA:  Spinal anesthesia.   DESCRIPTION OF PROCEDURE:  The patient was brought to the operating room and  placed under a spinal anesthetic by Quillian Quince, M.D.  The abdomen was  prepped with Betadine solution.  A Foley catheter was inserted to straight  drain.  The abdomen was then draped as a sterile field.  The anesthesia was  confirmed.  A transverse incision was made through the old scar and carried  in layers down to the fascia.  The fascia was incised transversely.  The  fascia was separated from the rectus muscle superiorly and inferiorly.  The  rectus muscle was already split in the midline.  The peritoneum was opened  vertically.  The lower uterine segment peritoneum was not well-developed.  I  did make an incision through what appeared to be the peritoneum and tried to  push the bladder inferiorly, then made an incision into the lower uterine  segment.  The membranes were intact.  We extended the incision transversely  and upwardly in an upward and outward direction on both sides, ruptured the  membranes, delivered the vertex through the incisional opening.  The nose  and  pharynx were suctioned with the bulb.  The baby was already crying.  The  remainder of the baby was delivered.  The cord was clamped and the infant  was given to Dr. Eric Form, who was in attendance.  He assigned the 6 pound 13  ounce female infant Apgars of 8 at one and nine at five minute.  The placenta  was removed.  The inside of the uterus was inspected and found to be free of  debris.  The cervix was dilated with a ring forceps.  The uterus was then  closed in two layers using a running locked of 0 Vicryl on the first layer  and nonlocking suture of the same material on the second layer.  Hemostasis  was adequate.  Liberal irrigation confirmed hemostasis.  Both tubes and  ovaries appeared normal, as did the uterus.  Neither the visceral nor the  parietal peritoneum were reapproximated.  The rectus muscle was closed with  interrupted sutures of 0 Vicryl, the  fascia was closed with two running sutures of 0 Vicryl, the subcu with a  running 3-0 Vicryl, and the skin was closed with automatic staples.  The  patient seemed to tolerate the procedure well.  Blood loss about 1000 mL.  Sponge and needle counts were correct, and she was returned to recovery in  satisfactory condition.                                               Malachi Pro. Ambrose Mantle, M.D.    TFH/MEDQ  D:  01/13/2003  T:  01/13/2003  Job:  [id number]

## 2011-01-10 NOTE — Op Note (Signed)
[facility]  Patient:    Natalie Ford                       MRN: [id number] Proc. Date: 07/23/99 Adm. 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  PROCEDURE:  Release of right transverse carpal ligament.  SURGEON:  Katy Fitch. Sypher, Montez Hageman., M.D.  ASSISTANT:  Jaquelyn Bitter. Chabon, P.A.  ANESTHESIA:  General.  SUPERVISING ANESTHESIOLOGIST:  Dr. Krista Blue.  INDICATIONS:  [name] is a 40 year old woman, who has been trouble by chronic numbness in her right hand.  Clinical examination demonstrated signs of  carpal tunnel syndrome.  Electrodiagnostic studies confirmed median neuropathy.  Due to the failure to respond to nonoperative measures, she is brought to the operating room at this time for release of her right transverse carpal ligament.  PROCEDURE:  Brodie Yin is brought to the operating room and placed supine position on the operating room table.  Following induction of general anesthesia, the right arm was prepped with Betadine soap and solution and sterilely draped.  Following exsanguination of limb with Esmarch bandage, arterial tourniquet is inflated to 230 mmHg.  The procedure commenced with a short incision in the line with the ring finger in the palm.  Subcutaneous tissues were carefully divided revealing the palmar fascia.  This was split longitudinally to the common sensory branch of the median nerve.  These are followed back to the transverse carpal ligament, which is carefully isolated from the median nerve.  The ligament was released on its ulnar border extending into the distal forearm.  Bleeding points along the margin of the released transverse carpal ligament were electrocauterized with bipolar  current, followed by repair of the skin with intradermal 3-0 Prolene suture.  A compressive dressing is applied with a volar forearm splint maintaining the wrist in 5 degrees of dorsiflexion.  There are no apparent complications.  AFTERCARE:  [name] is given a prescription for Percocet 5 mg/325 mg one r two p.o. q.4-6h. p.r.n. pain 20 tablets without refill. DD:  07/23/99 TD:  07/23/99 Job: [id number]

## 2011-01-10 NOTE — H&P (Signed)
NAME:  Natalie Ford, Natalie Ford                       ACCOUNT NO.:  [id number]   MEDICAL RECORD NO.:  [id number]                   PATIENT TYPE:  INP   LOCATION:  NA                                   FACILITY:  [facility]   PHYSICIAN:  Malachi Pro. Ambrose Mantle, M.D.              DATE OF BIRTH:  Sep 19, 1970   DATE OF ADMISSION:  01/13/2003  DATE OF DISCHARGE:                                HISTORY & PHYSICAL   HISTORY OF PRESENT ILLNESS:  This is a 40 year old, white, married female,  para 0-1-0-1, gravida 2, last menstrual period 04/13/02, EEC 01/19/03 by  dates and 01/20/03 by ultrasound, admitted for repeat C-section after  declining vaginal birth after cesarean section.  Blood group and type A  negative with a negative antibody, nonreactive serology, rubella immune,  hepatitis B surface antigen negative, HIV negative, GC and Chlamydia  negative, triple screen normal, one hour Glucola 108, group B strep  negative.  This patient had a vaginal ultrasound on 06/15/02, crown-rump  length 2.13 cm, 8 weeks 5 days, [facility] 01/20/03.  The patient was given  information about vaginal birth after cesarean.  She had a repeat ultrasound  on 08/22/02, showing an average gestational age of [redacted] weeks and 5 days,  consistent with her prior ultrasound.  The patient had complaint of back  pain in early January.  She had a rash on her abdomen.  Pain was in her  right upper quadrant and right flank, radiated to her back, also hurt with  breathing.  The gallbladder ultrasound was normal without stones.  It was  slight physiologic dilatation of the right renal collecting system.  A CBC  and comprehensive metabolic profile were normal.  On 10/19/02, the patient  received injection of RhoGAM.  Her left calf was larger than her right.  Dopplers were checked and were negative for clot.  The patient decided on a  repeat C-section.  The patient had a GI virus on 12/13/02.  On 12/27/02, the  patient complained of an unusual appearance of  her vulva.  There were  prominent varicosities above the clitoris.  On 01/03/03, the patient  measured 33 cm at 37+ weeks; nonstress test was done.  Ultrasound was done  for measurements and AFI.  The ultrasound predicted an estimated fetal  weight of 2796 grams.  AFI was considered normal.  There was a nuchal cord.  The AFI was 10.8 cm, 5th percentile would have been 7.3 cm.  Estimated fetal  weight was the 25th percentile.  The patient had a nonstress test on May 18  and May 20, both of which were normal, and she is scheduled for C-section on  01/13/03.   PAST MEDICAL HISTORY:  1. ALLERGY to PENICILLIN causing rash and swelling.  2. Operations:  She did have a C-section in 1998 for breach presentation and     low fluid at 38 weeks with the delivery of a  5 pound 7 ounce female.  3. Injuries:  She broke her ankle at age 79.  4. She had ASCUS Pap smear in 1992.  Repeats were normal.  5. She has a history of irritable bowel syndrome.   FAMILY HISTORY:  Her mother has a history of heart disease, requiring stents  and angioplasty, chronic hypertension, and diabetes.  Paternal aunt has  migraines.  Paternal grandfather with stomach cancer.   PHYSICAL EXAMINATION:  GENERAL:  Well-developed, but 4 foot 10 inch, white  female in no acute distress.  Weight at her last prenatal visit 136.5.  VITAL SIGNS:  Blood pressure 120/80, pulse 80.  HEENT:  Normal.  HEART AND LUNGS:  Normal sinus rhythm, no murmurs.  Lungs are clear to P&A.  BREASTS:  Not examined.  ABDOMEN:  Soft.  Fundal height 35 cm on 01/10/03.  Fetal heart tones normal  on a nonstress test 01/12/03.  Good accelerations.  No decelerations.  PELVIC:  Cervix on 01/10/03, long and closed, presenting part high.   ADMITTING IMPRESSION:  1. Intrauterine pregnancy at 39 weeks.  2. Possible small for dates.  3. Prior cesarean section, declines VBAC.  4. Patient is prepared for cesarean section.  5. She understands the risks and is ready  to proceed.                                               Malachi Pro. Ambrose Mantle, M.D.    TFH/MEDQ  D:  01/12/2003  T:  01/12/2003  Job:  [id number]

## 2011-01-10 NOTE — Assessment & Plan Note (Signed)
[facility]                             PULMONARY OFFICE NOTE   NAME:Ford, Natalie                      MRN:          [id number]  DATE:08/26/2006                            DOB:          05/12/1971    HISTORY OF PRESENT ILLNESS:  Patient is a very pleasant 40 year old  white female patient of Dr. Jodelle Green who has a known history of  hyperlipidemia who presents for a routine follow-up.  Patient has a  history of some mild depression and has previously been on Effexor XR in  the past.  Patient, approximately 2 months ago, had complained that she  was becoming feeling somewhat depressed with feelings of sadness, loss  of interest in activities and low energy.  Patient is having quite a bit  of trouble with her husband and has been recently separated.  Patient  was started on Effexor XR 75 mg.  She reports today that she has had  improvement in symptoms with decreased anxiety and depression symptoms.  She denies any suicidal ideation.  She complains that she still is  continuing to have trouble at home but is more manageable.  Patient also  is on Lipitor but is not taking consistently.  Last check total  cholesterol was 244 and LDL was 155.  Most recently patient rechecked on  August 21, 2006, with a total cholesterol of 216 and LDL of 140.  HDL  was 62.  Patient does exercise on a consistent basis.   PAST MEDICAL HISTORY:  Reviewed.   CURRENT MEDICATIONS:  Reviewed.   PHYSICAL EXAMINATION:  GENERAL APPEARANCE:  Patient is a very pleasant  female in no acute distress.  VITAL SIGNS:  She is afebrile with stable vital signs.  Her O2  saturation is 98% on room air.  LUNGS:  Lung sounds are clear.  CARDIOVASCULAR:  Regular rate and rhythm.  EXTREMITIES:  Warm without any edema.   LABORATORY DATA:  CMET unremarkable.  Total cholesterol 216,  triglycerides 65, HDL 62, LDL 140.   IMPRESSION AND PLAN:  1. Anxiety and depression.  Long discussion with  patient concerning      recommendations for personal and family counseling.  Patient will      continue on current dose of Effexor and has been recommended on      stress reducing activities, exercise counseling and goal setting.      Patient will recheck here in 2-3 months or sooner if needed.  2. Hyperlipidemia.  Patient has been recommended to take Lipitor on an      every day basis      along with dietary and exercise measures.  Will recheck here in      three months for fasting lipid panel.      Rubye Oaks, NP  Electronically Signed      Lonzo Cloud. Kriste Basque, MD  Electronically Signed   TP/MedQ  DD: 08/26/2006  DT: 08/26/2006  Job #: [id number]

## 2012-01-14 ENCOUNTER — Telehealth: Payer: Self-pay | Admitting: Pulmonary Disease

## 2012-01-14 NOTE — Telephone Encounter (Signed)
lmomtcb for the pt to see if she can come in on July 1 for her fasting labs.

## 2012-01-14 NOTE — Telephone Encounter (Signed)
Called, spoke with pt.  She would like to come in prior to CPX on July 5 for labs.  Dr. Kriste Basque, pls advise.  Thank you.

## 2012-01-15 ENCOUNTER — Other Ambulatory Visit: Payer: Self-pay | Admitting: Pulmonary Disease

## 2012-01-15 DIAGNOSIS — Z0000 Encounter for general adult medical examination without abnormal findings: Secondary | ICD-10-CM

## 2012-01-15 NOTE — Telephone Encounter (Signed)
Pt called back and spoke with [facility] and she will come in on July 1 for her fasting labs.  These are in the computer for her.

## 2012-01-15 NOTE — Telephone Encounter (Signed)
Pt stated that she will complete the fasting labs on July 1st.  Pt stated nothing further needed at this time.

## 2012-02-16 ENCOUNTER — Telehealth: Payer: Self-pay | Admitting: Pulmonary Disease

## 2012-02-16 NOTE — Telephone Encounter (Signed)
Called and spoke with pt and she is aware of rescheduled appt from 7/5 to 7/11 at 12.  Nothing further needed.

## 2012-02-16 NOTE — Telephone Encounter (Signed)
Called, spoke with pt who states she is returning Leigh's call regarding her appt on July 5 with Dr. Kriste Basque. States this needs to be rescheduled as Dr. Kriste Basque will be out of town.    Leigh, I tried to call you, but you were with a pt.  Pls advise if you had a specific place where you wanted to schedule pt.  Thank you.

## 2012-02-24 ENCOUNTER — Other Ambulatory Visit (INDEPENDENT_AMBULATORY_CARE_PROVIDER_SITE_OTHER): Payer: Self-pay

## 2012-02-24 DIAGNOSIS — Z0000 Encounter for general adult medical examination without abnormal findings: Secondary | ICD-10-CM

## 2012-02-24 LAB — URINALYSIS
Bilirubin Urine: NEGATIVE
Ketones, ur: NEGATIVE
Leukocytes, UA: NEGATIVE
Nitrite: NEGATIVE
Specific Gravity, Urine: 1.025 (ref 1.000–1.030)
Total Protein, Urine: NEGATIVE
Urine Glucose: NEGATIVE
Urobilinogen, UA: 0.2 (ref 0.0–1.0)
pH: 6 (ref 5.0–8.0)

## 2012-02-24 LAB — LIPID PANEL
Cholesterol: 196 mg/dL (ref 0–200)
HDL: 64 mg/dL (ref >39.00)
LDL Cholesterol: 121 mg/dL — ABNORMAL HIGH (ref 0–99)
Total CHOL/HDL Ratio: 3
Triglycerides: 56 mg/dL (ref 0.0–149.0)
VLDL: 11.2 mg/dL (ref 0.0–40.0)

## 2012-02-24 LAB — HEPATIC FUNCTION PANEL
ALT: 23 U/L (ref 0–35)
AST: 24 U/L (ref 0–37)
Albumin: 4.3 g/dL (ref 3.5–5.2)
Alkaline Phosphatase: 44 U/L (ref 39–117)
Bilirubin, Direct: 0.2 mg/dL (ref 0.0–0.3)
Total Bilirubin: 1.3 mg/dL — ABNORMAL HIGH (ref 0.3–1.2)
Total Protein: 7.1 g/dL (ref 6.0–8.3)

## 2012-02-24 LAB — CBC WITH DIFFERENTIAL/PLATELET
Basophils Absolute: 0 10*3/uL (ref 0.0–0.1)
Basophils Relative: 0.9 % (ref 0.0–3.0)
Eosinophils Absolute: 0.1 10*3/uL (ref 0.0–0.7)
Eosinophils Relative: 1.6 % (ref 0.0–5.0)
HCT: 41.4 % (ref 36.0–46.0)
Hemoglobin: 13.9 g/dL (ref 12.0–15.0)
Lymphocytes Relative: 31.8 % (ref 12.0–46.0)
Lymphs Abs: 1.4 10*3/uL (ref 0.7–4.0)
MCHC: 33.6 g/dL (ref 30.0–36.0)
MCV: 95.7 fl (ref 78.0–100.0)
Monocytes Absolute: 0.4 10*3/uL (ref 0.1–1.0)
Monocytes Relative: 8.4 % (ref 3.0–12.0)
Neutro Abs: 2.6 10*3/uL (ref 1.4–7.7)
Neutrophils Relative %: 57.3 % (ref 43.0–77.0)
Platelets: 192 10*3/uL (ref 150.0–400.0)
RBC: 4.33 Mil/uL (ref 3.87–5.11)
RDW: 12.7 % (ref 11.5–14.6)
WBC: 4.5 10*3/uL (ref 4.5–10.5)

## 2012-02-24 LAB — BASIC METABOLIC PANEL
BUN: 11 mg/dL (ref 6–23)
CO2: 25 mEq/L (ref 19–32)
Calcium: 9.4 mg/dL (ref 8.4–10.5)
Chloride: 105 mEq/L (ref 96–112)
Creatinine, Ser: 0.8 mg/dL (ref 0.4–1.2)
GFR: 90.6 mL/min (ref >60.00)
Glucose, Bld: 89 mg/dL (ref 70–99)
Potassium: 4.2 mEq/L (ref 3.5–5.1)
Sodium: 140 mEq/L (ref 135–145)

## 2012-02-24 LAB — TSH: TSH: 1.66 u[IU]/mL (ref 0.35–5.50)

## 2012-02-27 ENCOUNTER — Encounter: Payer: Self-pay | Admitting: Pulmonary Disease

## 2012-03-03 ENCOUNTER — Encounter: Payer: Self-pay | Admitting: *Deleted

## 2012-03-04 ENCOUNTER — Ambulatory Visit (INDEPENDENT_AMBULATORY_CARE_PROVIDER_SITE_OTHER): Payer: Commercial Managed Care - PPO | Admitting: Pulmonary Disease

## 2012-03-04 ENCOUNTER — Encounter: Payer: Self-pay | Admitting: Pulmonary Disease

## 2012-03-04 VITALS — BP 102/66 | HR 66 | Temp 98.0°F | Ht 59.0 in | Wt 113.4 lb

## 2012-03-04 DIAGNOSIS — E785 Hyperlipidemia, unspecified: Secondary | ICD-10-CM

## 2012-03-04 DIAGNOSIS — J309 Allergic rhinitis, unspecified: Secondary | ICD-10-CM

## 2012-03-04 DIAGNOSIS — K589 Irritable bowel syndrome without diarrhea: Secondary | ICD-10-CM

## 2012-03-04 DIAGNOSIS — Z0000 Encounter for general adult medical examination without abnormal findings: Secondary | ICD-10-CM

## 2012-03-04 NOTE — Progress Notes (Signed)
Subjective:     Patient ID: Natalie Ford, female   DOB: 1970-09-05, 41 y.o.   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 I last saw Natalie Ford 2/07- good general health> hx AR, IBS, anxiety on no regular meds... she saw TP last yr- dong well, labs all normal x LDL= 120 on diet Rx alone... she has been running 2-26mi/d 4-5d per wk & weight down 5# to 122# (59" tall & BMI ~25)... no new complaints or concerns; she is working on her BSN & will be completing her classes at [employer]...  ~  March 04, 2012:  20mo ROV & CPX> Natalie Ford is working weekend option at [employer] in the [place] section; also works part time for [employer], [employer]; Natalie Ford is going into 19th grade & Natalie Ford into 4th; she & Natalie Ford have been separated again ~41yr;  Medically she is doing quite well- just a few ortho issues> she is a runner & has actually lost ~9# down to 113# & we discussed not going any lower, add in a nutritional supplement, etc; she saw [name] for shot in left knee & has been doing fine since then w/ sleeve brace while working out; she has noted a sm knot on right index finger- ?ganglion cyst & we discussed warm soaks & observ for now- refer to DrGramig if persists or gets larger;  She also notes spider veins on legs- not severe & we discussed VVS eval if she desires...    We reviewed prob list, meds (no regular meds, just Vits etc), xrays and labs> see below for updates>> LABS 7/13:  FLP- ok x LDL=121;  Chems- wnl;  CBC- wnl;  TSH=1.66;  UA- clear   Problem List:    HEALTH MAINTENANCE EXAM (ICD-V70.0) ~  GI:  she had a colonoscopy 2000 by DrStark because of diarrhea prob at that time... the colon was neg (sl tortuous) & rec for hi fiber diet. ~  GYN:  DrHenley for PAP, etc... She reports up to date & anticipates a baseline Mammogram soon... ~  Immunizations:  she is up to date w/ Flu shot yearly thru [facility] & had Tetanus shot ~2000 (she will  inquire re 41yr update vaccination at work)...  ALLERGIC RHINITIS (ICD-477.9) - she takes OTC antihist Prn...  HYPERCHOLESTEROLEMIA, BORDERLINE (ICD-272.4) - prev on Lipitor Rx, she prefers to control w/ diet alone... ~  FLP 1/07 showed TChol 244, TG 63, HDL 73, LDL 156... rec> Lip10 but pt stopped on her own. ~  FLP 11/08 showed TChol 204, TG 72, HDL 71, LDL 123 ~  FLP 11/10 showed TChol 192, TG 59, HDL 60, LDL 120 ~  FLP 9/11 showed TChol 196, TG 56, HDL 64, LDL 121  Hx of DYSPEPSIA (NWG-956.2) - she uses OTC H2 blockers Prn...  IRRITABLE BOWEL SYNDROME (ICD-564.1) - hx IBS w/ diarrhea & eval 2000 by DrStark> colonoscopy was neg x tortuous colon & rec for incr fiber & Bentyl Prn... ~  she has had 2 severe gastroenteritis bouts one in 2007 w/ overnight hosp for IVF & the other in 2011 treated in ER similarly...  Hx of LUMBOSACRAL STRAIN (ICD-846.0) Hx Right KNEE pain >> she is a runner & saw [name] w/ shot in knee & resolved; she uses sleeve brace while running...   No past surgical history on file.   Outpatient Encounter Prescriptions as of 03/04/2012  Medication Sig Dispense Refill  .  acetaminophen (TYLENOL) 325 MG tablet Take 325 mg by mouth every 6 (six) hours as needed.      [name] b complex vitamins tablet Take 1 tablet by mouth daily.      Alphonsus Sias Pollen 1000 MG TABS Take 1 tablet by mouth daily.      . Calcium-Vitamin D-Vitamin K (VIACTIV) 500-500-40 MG-UNT-MCG CHEW 1 daily      . Garlic 100 MG TABS Take 1 tablet by mouth daily.      . Ginkgo Biloba 40 MG TABS Take 1 tablet by mouth daily.      . Multiple Vitamin (MULTIVITAMIN) capsule Take 1 capsule by mouth daily.      [name] DISCONTD: Calcium Carbonate-Vit D-Min 600-200 MG-UNIT TABS Take 1 tablet by mouth daily.        Allergies  Allergen Reactions  . Penicillins     rash    Current Medications, Allergies, Past Medical History, Past Surgical History, Family History, and Social History were reviewed in [facility] record.   Review of Systems    The patient denies fever, chills, sweats, anorexia, fatigue, weakness, malaise, weight loss, sleep disorder, blurring, diplopia, eye irritation, eye discharge, vision loss, eye pain, photophobia, earache, ear discharge, tinnitus, decreased hearing, nasal congestion, nosebleeds, sore throat, hoarseness, chest pain, palpitations, syncope, dyspnea on exertion, orthopnea, PND, peripheral edema, cough, dyspnea at rest, excessive sputum, hemoptysis, wheezing, pleurisy, nausea, vomiting, diarrhea, constipation, change in bowel habits, abdominal pain, melena, hematochezia, jaundice, gas/bloating, indigestion/heartburn, dysphagia, odynophagia, dysuria, hematuria, urinary frequency, urinary hesitancy, nocturia, incontinence, back pain, joint pain, joint swelling, muscle cramps, muscle weakness, stiffness, arthritis, sciatica, restless legs, leg pain at night, leg pain with exertion, rash, itching, dryness, suspicious lesions, paralysis, paresthesias, seizures, tremors, vertigo, transient blindness, frequent falls, frequent headaches, difficulty walking, depression, anxiety, memory loss, confusion, cold intolerance, heat intolerance, polydipsia, polyphagia, polyuria, unusual weight change, abnormal bruising, bleeding, enlarged lymph nodes, urticaria, allergic rash, hay fever, and recurrent infections.     Objective:   Physical Exam     WD, WN, 41 y/o WF in NAD... GENERAL:  Alert & oriented; pleasant & cooperative... HEENT:  [place]/AT, EOM-wnl, PERRLA, Fundi-benign, EACs-clear, TMs-wnl, NOSE-clear, THROAT-clear & wnl. NECK:  Supple w/ full ROM; no JVD; normal carotid impulses w/o bruits; no thyromegaly or nodules palpated; no lymphadenopathy. CHEST:  Clear to P & A; without wheezes/ rales/ or rhonchi. HEART:  Regular Rhythm; without murmurs/ rubs/ or gallops. ABDOMEN:  Soft & nontender; normal bowel sounds; no organomegaly or masses detected. EXT: without  deformities or arthritic changes; no varicose veins/ venous insuffic/ or edema. NEURO:  CN's intact; motor testing normal; sensory testing normal; gait normal & balance OK. DERM:  No lesions noted; no rash etc...  RADIOLOGY DATA:  Reviewed in the EPIC EMR & discussed w/ the patient...    <<CXR 9/11>> normal heart size, clear lungs, NAD...  LABORATORY DATA:  Reviewed in the EPIC EMR & discussed w/ the patient...   Assessment:     CPX>>  AR>  Min symptoms in the spring treated w/ OTC meds as needed...  CHOL>  She has mild hypercholesterolemia w/ LDLs~120 range for yrs; Mother had incr Chol & MI, stroke, but was a smoker as well; she does not want med rx so we reviewed diet etc...  Hx dyspepsia>  Remote hx & doing satis; she knows to use Zantac/ Pepcid vs PPI if symptoms recur...  IBS>  Stable & essentially asymptomatic now on fiber, Metamucil, etc...  Ortho>  She is a runner & saw [name] for shot in her knee & improved; also has sm ?ganglion cyst on right inder finger- she will hotsoak it & f/u w/ drGramig if not resolved...     Plan:     Patient's Medications  New Prescriptions   No medications on file  Previous Medications   ACETAMINOPHEN (TYLENOL) 325 MG TABLET    Take 325 mg by mouth every 6 (six) hours as needed.   B COMPLEX VITAMINS TABLET    Take 1 tablet by mouth daily.   BEE POLLEN 1000 MG TABS    Take 1 tablet by mouth daily.   CALCIUM-VITAMIN D-VITAMIN K (VIACTIV) 500-500-40 MG-UNT-MCG CHEW    1 daily   GARLIC 100 MG TABS    Take 1 tablet by mouth daily.   GINKGO BILOBA 40 MG TABS    Take 1 tablet by mouth daily.   MULTIPLE VITAMIN (MULTIVITAMIN) CAPSULE    Take 1 capsule by mouth daily.  Modified Medications   No medications on file  Discontinued Medications   CALCIUM CARBONATE-VIT D-MIN 600-200 MG-UNIT TABS    Take 1 tablet by mouth daily.

## 2012-03-05 ENCOUNTER — Encounter: Payer: Self-pay | Admitting: Pulmonary Disease

## 2012-03-05 NOTE — Patient Instructions (Addendum)
Today we updated your med list in our EPIC system...    Continue your current medications the same...  Call for any problems...  Let's plan a follow up visit in 1 yr.Marland KitchenMarland Kitchen

## 2012-05-14 ENCOUNTER — Encounter: Payer: Self-pay | Admitting: Pulmonary Disease

## 2013-05-18 ENCOUNTER — Ambulatory Visit: Payer: Commercial Managed Care - PPO | Admitting: Pulmonary Disease

## 2013-06-20 ENCOUNTER — Telehealth: Payer: Self-pay | Admitting: Pulmonary Disease

## 2013-06-20 DIAGNOSIS — Z0000 Encounter for general adult medical examination without abnormal findings: Secondary | ICD-10-CM

## 2013-06-20 NOTE — Telephone Encounter (Signed)
Last visit on 03-04-12, upcoming appt on 06-29-13. Pt wants labs ordered for appt. Please advise. Carron Curie, CMA

## 2013-06-21 NOTE — Telephone Encounter (Signed)
Lab order has been placed in the computer for the pt.  She can come prior to her appt. thanks

## 2013-06-24 ENCOUNTER — Other Ambulatory Visit (INDEPENDENT_AMBULATORY_CARE_PROVIDER_SITE_OTHER): Payer: Commercial Managed Care - PPO

## 2013-06-24 DIAGNOSIS — Z0000 Encounter for general adult medical examination without abnormal findings: Secondary | ICD-10-CM

## 2013-06-24 LAB — BASIC METABOLIC PANEL
BUN: 10 mg/dL (ref 6–23)
CO2: 26 mEq/L (ref 19–32)
Calcium: 9.5 mg/dL (ref 8.4–10.5)
Chloride: 103 mEq/L (ref 96–112)
Creatinine, Ser: 0.9 mg/dL (ref 0.4–1.2)
GFR: 75.84 mL/min (ref >60.00)
Glucose, Bld: 107 mg/dL — ABNORMAL HIGH (ref 70–99)
Potassium: 4.3 mEq/L (ref 3.5–5.1)
Sodium: 137 mEq/L (ref 135–145)

## 2013-06-24 LAB — URINALYSIS
Bilirubin Urine: NEGATIVE
Ketones, ur: NEGATIVE
Leukocytes, UA: NEGATIVE
Nitrite: NEGATIVE
Specific Gravity, Urine: <=1.005 (ref 1.000–1.030)
Total Protein, Urine: NEGATIVE
Urine Glucose: NEGATIVE
Urobilinogen, UA: 0.2 (ref 0.0–1.0)
pH: 6.5 (ref 5.0–8.0)

## 2013-06-24 LAB — LIPID PANEL
Cholesterol: 184 mg/dL (ref 0–200)
HDL: 66.6 mg/dL (ref >39.00)
LDL Cholesterol: 104 mg/dL — ABNORMAL HIGH (ref 0–99)
Total CHOL/HDL Ratio: 3
Triglycerides: 67 mg/dL (ref 0.0–149.0)
VLDL: 13.4 mg/dL (ref 0.0–40.0)

## 2013-06-24 LAB — CBC WITH DIFFERENTIAL/PLATELET
Basophils Absolute: 0 10*3/uL (ref 0.0–0.1)
Basophils Relative: 0.3 % (ref 0.0–3.0)
Eosinophils Absolute: 0 10*3/uL (ref 0.0–0.7)
Eosinophils Relative: 0.3 % (ref 0.0–5.0)
HCT: 41.9 % (ref 36.0–46.0)
Hemoglobin: 14.6 g/dL (ref 12.0–15.0)
Lymphocytes Relative: 12.2 % (ref 12.0–46.0)
Lymphs Abs: 1.1 10*3/uL (ref 0.7–4.0)
MCHC: 34.8 g/dL (ref 30.0–36.0)
MCV: 94.1 fl (ref 78.0–100.0)
Monocytes Absolute: 0.5 10*3/uL (ref 0.1–1.0)
Monocytes Relative: 6.2 % (ref 3.0–12.0)
Neutro Abs: 7 10*3/uL (ref 1.4–7.7)
Neutrophils Relative %: 81 % — ABNORMAL HIGH (ref 43.0–77.0)
Platelets: 231 10*3/uL (ref 150.0–400.0)
RBC: 4.46 Mil/uL (ref 3.87–5.11)
RDW: 12.6 % (ref 11.5–14.6)
WBC: 8.6 10*3/uL (ref 4.5–10.5)

## 2013-06-24 LAB — HEPATIC FUNCTION PANEL
ALT: 19 U/L (ref 0–35)
AST: 22 U/L (ref 0–37)
Albumin: 4.6 g/dL (ref 3.5–5.2)
Alkaline Phosphatase: 43 U/L (ref 39–117)
Bilirubin, Direct: 0.2 mg/dL (ref 0.0–0.3)
Total Bilirubin: 1.2 mg/dL (ref 0.3–1.2)
Total Protein: 7.4 g/dL (ref 6.0–8.3)

## 2013-06-24 LAB — TSH: TSH: 0.93 u[IU]/mL (ref 0.35–5.50)

## 2013-06-29 ENCOUNTER — Ambulatory Visit (INDEPENDENT_AMBULATORY_CARE_PROVIDER_SITE_OTHER): Payer: Commercial Managed Care - PPO | Admitting: Pulmonary Disease

## 2013-06-29 ENCOUNTER — Encounter (INDEPENDENT_AMBULATORY_CARE_PROVIDER_SITE_OTHER): Payer: Self-pay

## 2013-06-29 ENCOUNTER — Encounter: Payer: Self-pay | Admitting: Pulmonary Disease

## 2013-06-29 VITALS — BP 90/58 | HR 61 | Temp 98.4°F | Ht 59.0 in | Wt 109.4 lb

## 2013-06-29 DIAGNOSIS — Z0000 Encounter for general adult medical examination without abnormal findings: Secondary | ICD-10-CM | POA: Insufficient documentation

## 2013-06-29 DIAGNOSIS — F419 Anxiety disorder, unspecified: Secondary | ICD-10-CM

## 2013-06-29 DIAGNOSIS — E785 Hyperlipidemia, unspecified: Secondary | ICD-10-CM

## 2013-06-29 DIAGNOSIS — K589 Irritable bowel syndrome without diarrhea: Secondary | ICD-10-CM

## 2013-06-29 DIAGNOSIS — F411 Generalized anxiety disorder: Secondary | ICD-10-CM

## 2013-06-29 DIAGNOSIS — J309 Allergic rhinitis, unspecified: Secondary | ICD-10-CM

## 2013-06-29 MED ORDER — ALPRAZOLAM 0.25 MG PO TABS: ORAL_TABLET | ORAL | Status: DC

## 2013-06-29 NOTE — Progress Notes (Addendum)
Subjective:     Patient ID: Natalie Ford, female   DOB: 1971/08/21, 42 y.o.   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 I saw Natalie Ford 2/07- good general health> hx AR, IBS, anxiety on no regular meds... she saw TP 2010- dong well, labs all normal x LDL= 120 on diet Rx alone... she has been running 2-25mi/d 4-5d per wk & weight down 5# to 122# (59" tall & BMI ~25)... no new complaints or concerns; she is working on her BSN & will be completing her classes at [employer]...  ~  March 04, 2012:  69mo ROV & CPX> Natalie Ford is working weekend option at [employer] in the [place] section; also works part time for [employer], [employer]; Natalie Ford is going into 9th grade & Natalie Ford into 4th; she & Natalie Ford have been separated again ~65yr;  Medically she is doing quite well- just a few ortho issues> she is a runner & has actually lost ~9# down to 113# & we discussed not going any lower, add in a nutritional supplement, etc; she saw [name] for shot in left knee & has been doing fine since then w/ sleeve brace while working out; she has noted a sm knot on right index finger- ?ganglion cyst & we discussed warm soaks & observ for now- refer to DrGramig if persists or gets larger;  She also notes spider veins on legs- not severe & we discussed VVS eval if she desires...    We reviewed prob list, meds (no regular meds, just Vits etc), xrays and labs> see below for updates>> LABS 7/13:  FLP- ok x LDL=121;  Chems- wnl;  CBC- wnl;  TSH=1.66;  UA- clear   ~  June 29, 2013:  Yearly ROV & CPX> Natalie Ford continues to do well medically- no new complaints or concerns today; Natalie Ford is now 74, in 11th grade & attending [employer], plays volleyball & wants to be an [employer] agent; Natalie Ford is 10, in the 5th grade and plays baseball; Natalie Ford is finishing her schooling at [employer] for her BSN.... Previously she was feeling tired but not resting well etc; now she is feeling  better- active & on the go, rests well, energy good etc...  Weight is down 4# further to 109#, she is on no regular meds just vits and Tylenol...     We reviewed prob list, meds, xrays and labs> see below for updates >> she had the 2014 FLU vaccine in Oct...  LABS 10/14:  FLP- at goals on diet alone;  Chems- wnl, BS=107;  CBC-wnl;  TSH=0.93...  EKG 11/14 showed NSR, rate63wnl, NAD...           Problem List:    HEALTH MAINTENANCE EXAM (ICD-V70.0) ~  GI:  she had a colonoscopy 2000 by DrStark because of diarrhea prob at that time... the colon was neg (sl tortuous) & rec for hi fiber diet. ~  GYN:  DrHenley for PAP, etc... She reports up to date & baseline screening Mammogram 9/13  In [place] was neg...  ~  Immunizations:  she is up to date w/ Flu shot yearly thru [facility] & had Tetanus shot ~2000 (she will inquire re 28yr update vaccination at work)...  ALLERGIC RHINITIS (ICD-477.9) - she takes OTC antihist Prn... ~  Last CXR here was 9/11> normal heart size, clear lungs, NAD ~  EKG 11/14 showed NSR, rate63wnl, NAD...  HYPERCHOLESTEROLEMIA, BORDERLINE (ICD-272.4) - prev  on Lipitor Rx, she prefers to control w/ diet alone... ~  FLP 1/07 showed TChol 244, TG 63, HDL 73, LDL 156... rec> Lip10 but pt stopped on her own. ~  FLP 11/08 showed TChol 204, TG 72, HDL 71, LDL 123 ~  FLP 11/10 showed TChol 192, TG 59, HDL 60, LDL 120 ~  FLP 9/11 showed TChol 196, TG 56, HDL 64, LDL 121 ~  FLP 1/12 showed TChol 185, TG 46, HDL 65, LDL 111 ~  FLP 7/13 showed TChol 196, TG 56, HDL 64, LDL 121 ~  FLP 10/14 showed TChol 184, TG 67, HDL 67, LDL 104  Hx of DYSPEPSIA (ZOX-096.0) - she uses OTC H2 blockers Prn...  IRRITABLE BOWEL SYNDROME (ICD-564.1) - hx IBS w/ diarrhea & eval 2000 by DrStark> colonoscopy was neg x tortuous colon & rec for incr fiber & Bentyl Prn... ~  she has had 2 severe gastroenteritis bouts one in 2007 w/ overnight hosp for IVF & the other in 2011 treated in ER similarly...  Hx of  LUMBOSACRAL STRAIN (ICD-846.0) Hx Right KNEE pain >> she is a runner & saw [name] w/ shot in knee & resolved; she uses sleeve brace while running...   History reviewed. No pertinent past surgical history.   Outpatient Encounter Prescriptions as of 06/29/2013  Medication Sig  . b complex vitamins tablet Take 1 tablet by mouth daily.  Alphonsus Sias Pollen 1000 MG TABS Take 1 tablet by mouth daily.  . Calcium-Vitamin D-Vitamin K (VIACTIV) 500-500-40 MG-UNT-MCG CHEW 1 daily  . ibuprofen (ADVIL,MOTRIN) 200 MG tablet Take 200 mg by mouth every 6 (six) hours as needed.  . Multiple Vitamin (MULTIVITAMIN) capsule Take 1 capsule by mouth daily.  . [DISCONTINUED] acetaminophen (TYLENOL) 325 MG tablet Take 325 mg by mouth every 6 (six) hours as needed.  . [DISCONTINUED] Garlic 100 MG TABS Take 1 tablet by mouth daily.  . [DISCONTINUED] Ginkgo Biloba 40 MG TABS Take 1 tablet by mouth daily.    Allergies  Allergen Reactions  . Penicillins     rash    Current Medications, Allergies, Past Medical History, Past Surgical History, Family History, and Social History were reviewed in [employer] record.   Review of Systems    The patient denies fever, chills, sweats, anorexia, fatigue, weakness, malaise, weight loss, sleep disorder, blurring, diplopia, eye irritation, eye discharge, vision loss, eye pain, photophobia, earache, ear discharge, tinnitus, decreased hearing, nasal congestion, nosebleeds, sore throat, hoarseness, chest pain, palpitations, syncope, dyspnea on exertion, orthopnea, PND, peripheral edema, cough, dyspnea at rest, excessive sputum, hemoptysis, wheezing, pleurisy, nausea, vomiting, diarrhea, constipation, change in bowel habits, abdominal pain, melena, hematochezia, jaundice, gas/bloating, indigestion/heartburn, dysphagia, odynophagia, dysuria, hematuria, urinary frequency, urinary hesitancy, nocturia, incontinence, back pain, joint pain, joint swelling, muscle  cramps, muscle weakness, stiffness, arthritis, sciatica, restless legs, leg pain at night, leg pain with exertion, rash, itching, dryness, suspicious lesions, paralysis, paresthesias, seizures, tremors, vertigo, transient blindness, frequent falls, frequent headaches, difficulty walking, depression, anxiety, memory loss, confusion, cold intolerance, heat intolerance, polydipsia, polyphagia, polyuria, unusual weight change, abnormal bruising, bleeding, enlarged lymph nodes, urticaria, allergic rash, hay fever, and recurrent infections.     Objective:   Physical Exam     WD, WN, 42 y/o WF in NAD... GENERAL:  Alert & oriented; pleasant & cooperative... HEENT:  [place]/AT, EOM-wnl, PERRLA, Fundi-benign, EACs-clear, TMs-wnl, NOSE-clear, THROAT-clear & wnl. NECK:  Supple w/ full ROM; no JVD; normal carotid impulses w/o bruits; no thyromegaly or nodules palpated; no  lymphadenopathy. CHEST:  Clear to P & A; without wheezes/ rales/ or rhonchi. HEART:  Regular Rhythm; without murmurs/ rubs/ or gallops. ABDOMEN:  Soft & nontender; normal bowel sounds; no organomegaly or masses detected. EXT: without deformities or arthritic changes; no varicose veins/ venous insuffic/ or edema. NEURO:  CN's intact; motor testing normal; sensory testing normal; gait normal & balance OK. DERM:  No lesions noted; no rash etc...  RADIOLOGY DATA:  Reviewed in the EPIC EMR & discussed w/ the patient...    <<CXR 9/11>> normal heart size, clear lungs, NAD...    <<EKG 11/14 showed NSR, rate63wnl, NAD...  LABORATORY DATA:  Reviewed in the EPIC EMR & discussed w/ the patient...   Assessment:     CPX>>  AR>  Min symptoms in the spring treated w/ OTC meds as needed...  CHOL>  She has mild hypercholesterolemia w/ LDLs~120 range for yrs; Mother had incr Chol & MI, stroke, but was a smoker as well; she does not want med rx so we reviewed diet etc...  Hx dyspepsia>  Remote hx & doing satis; she knows to use Zantac/ Pepcid vs PPI if  symptoms recur...  IBS>  Stable & essentially asymptomatic now on fiber, Metamucil, etc...  Ortho>  She is a runner & saw [name] for shot in her knee & improved; also has sm ?ganglion cyst on right inder finger- she will hotsoak it & f/u w/ drGramig if not resolved...     Plan:     Patient's Medications  New Prescriptions   ALPRAZOLAM (XANAX) 0.25 MG TABLET    Take 1/2 to 1 tablet by mouth three times daily as needed  Previous Medications   B COMPLEX VITAMINS TABLET    Take 1 tablet by mouth daily.   BEE POLLEN 1000 MG TABS    Take 1 tablet by mouth daily.   CALCIUM-VITAMIN D-VITAMIN K (VIACTIV) 500-500-40 MG-UNT-MCG CHEW    1 daily   IBUPROFEN (ADVIL,MOTRIN) 200 MG TABLET    Take 200 mg by mouth every 6 (six) hours as needed.   MULTIPLE VITAMIN (MULTIVITAMIN) CAPSULE    Take 1 capsule by mouth daily.  Modified Medications   No medications on file  Discontinued Medications   ACETAMINOPHEN (TYLENOL) 325 MG TABLET    Take 325 mg by mouth every 6 (six) hours as needed.   GARLIC 100 MG TABS    Take 1 tablet by mouth daily.   GINKGO BILOBA 40 MG TABS    Take 1 tablet by mouth daily.

## 2013-06-29 NOTE — Patient Instructions (Signed)
Today we updated your med list in our EPIC system...    Continue your current medications the same...  Today we did a follow up EKG & FASTING blood work...    We will contact you w/ the results when available...   Try the ALPRAZOLAM 0.25mg  1/2 to 1 tab up to 3 times daily as needed for stress...  Call for any questions...  Let's plan a follow up visit in 68yr, sooner if needed for problems.Marland KitchenMarland Kitchen

## 2014-03-28 ENCOUNTER — Telehealth: Payer: Self-pay | Admitting: Pulmonary Disease

## 2014-03-28 NOTE — Telephone Encounter (Signed)
Called spoke with pt. She c/o right side pain and nausea after eating. Dull aching pain and at times during the day pain gets worse. Taking ibuprofen.   Saw NP at [facility]--did labs and urine test but everything was normal including kidney function. Wants an appt to be seen by SN since TP has no openings. Please advise thanks  Allergies  Allergen Reactions  . Penicillins     rash

## 2014-03-28 NOTE — Telephone Encounter (Signed)
Patient returning call.  [phone]

## 2014-03-28 NOTE — Telephone Encounter (Addendum)
Per SN---  Ok with OV with SN for recheck.  Have her come in either this afternoon or in the morning for an appt and bring the labs with her that was done at [facility].  Thanks  i have called and lmomtcb for the pt

## 2014-03-28 NOTE — Telephone Encounter (Signed)
Called spoke w/ pt. appt scheduled to see SN in the AM at 9:30. Nothing further needed

## 2014-03-29 ENCOUNTER — Ambulatory Visit (INDEPENDENT_AMBULATORY_CARE_PROVIDER_SITE_OTHER): Payer: Commercial Managed Care - PPO | Admitting: Pulmonary Disease

## 2014-03-29 ENCOUNTER — Encounter: Payer: Self-pay | Admitting: Pulmonary Disease

## 2014-03-29 ENCOUNTER — Encounter (INDEPENDENT_AMBULATORY_CARE_PROVIDER_SITE_OTHER): Payer: Self-pay

## 2014-03-29 VITALS — BP 106/62 | HR 78 | Temp 99.1°F | Ht 59.0 in | Wt 103.0 lb

## 2014-03-29 DIAGNOSIS — N200 Calculus of kidney: Secondary | ICD-10-CM

## 2014-03-29 DIAGNOSIS — R109 Unspecified abdominal pain: Secondary | ICD-10-CM

## 2014-03-29 MED ORDER — HYDROCODONE-ACETAMINOPHEN 5-325 MG PO TABS: ORAL_TABLET | ORAL | Status: DC

## 2014-03-29 NOTE — Patient Instructions (Signed)
Today we updated your med list in our EPIC system...    Continue your current medications the same...  We will sched a CT Abd and Pelvis w/ contrast (kidney stone protocol) for further eval of your right flank pain...    We will contact you w/ the results when available...   We wrote a new prescription for VICODIN (Hydrocodone) to use as needed for the pain...  Call for any questions.Marland KitchenMarland Kitchen

## 2014-03-29 NOTE — Progress Notes (Signed)
Subjective:     Patient ID: Gwenyth Ober, female   DOB: 1971/03/11, 43 y.o.   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 I saw Jackilyn 2/07- good general health> hx AR, IBS, anxiety on no regular meds... she saw TP 2010- dong well, labs all normal x LDL= 120 on diet Rx alone... she has been running 2-71mi/d 4-5d per wk & weight down 5# to 122# (59" tall & BMI ~25)... no new complaints or concerns; she is working on her BSN & will be completing her classes at [employer]...  ~  March 04, 2012:  82mo ROV & CPX> Abygail is working weekend option at [employer] in the [place] section; also works part time for [employer], [employer]; [name] is going into 9th grade & [name] into 4th; she & Judie Grieve have been separated again ~12yr;  Medically she is doing quite well- just a few ortho issues> she is a runner & has actually lost ~9# down to 113# & we discussed not going any lower, add in a nutritional supplement, etc; she saw [name] for shot in left knee & has been doing fine since then w/ sleeve brace while working out; she has noted a sm knot on right index finger- ?ganglion cyst & we discussed warm soaks & observ for now- refer to DrGramig if persists or gets larger;  She also notes spider veins on legs- not severe & we discussed VVS eval if she desires...    We reviewed prob list, meds (no regular meds, just Vits etc), xrays and labs> see below for updates>>  LABS 7/13:  FLP- ok x LDL=121;  Chems- wnl;  CBC- wnl;  TSH=1.66;  UA- clear   ~  June 29, 2013:  Yearly ROV & CPX> Mashelle continues to do well medically- no new complaints or concerns today; [name] is now 39, in 11th grade & attending [employer], plays volleyball & wants to be an [employer] agent; [name] is 10, in the 5th grade and plays baseball; Bretta is finishing her schooling at [employer] for her BSN.... Previously she was feeling tired but not resting well etc; now she is feeling  better- active & on the go, rests well, energy good etc...  Weight is down 4# further to 109#, she is on no regular meds just vits and Tylenol...     We reviewed prob list, meds, xrays and labs> see below for updates >> she had the 2014 FLU vaccine in Oct...   LABS 10/14:  FLP- at goals on diet alone;  Chems- wnl, BS=107;  CBC-wnl;  TSH=0.93...   EKG 11/14 showed NSR, rate63wnl, NAD.Marland KitchenMarland Kitchen   ~  March 29, 2014:  55mo ROV & add-on appt requested for right flank area pain> started about 3wks ago- dull, mod severe, comes & goes w/ exac to a 7-8 scale & may last for hours; feels better to move around 7 has actually been jogging w/ the discomfort (pain not made worse etc); notes some nausea, no vomiting, no abd pain or ch in BMs etc; states hx kidney stone as teen (passed it), then cut back on milk & no known recurrence- states urine looks ok, no burning, no blood, no other symptoms...   She went to employee health at [facility] & they did LABS= Chems- wnl, CBC- wnl, UA- clear;  Exam is neg- Abd soft & non-tender, no masses, no skin lesions seen etc...  We decided  to check CT Abd/Pelvis w/ kid stone protocol at [facility]=> done 03/29/14 & they called report: NEG w/o reason for right sided pain apparent (sm nonobstructing left renal calculi seen, none on right, no hydroureter, neg appendix, sm amt of fuid noted in cul-de-sac);  We suggested OV eval by Gyn & she saw [name] (he called to report neg Gyn exam, noting that the fluid in the cul-de-sac is an insignif/ normal finding)...   She is rec to rest, & we will observe on watchful waiting protocol- take VICODIN as needed for pain, observe for any rash, call for any changes or questions...            Problem List:    HEALTH MAINTENANCE EXAM (ICD-V70.0) ~  GI:  she had a colonoscopy 2000 by DrStark because of diarrhea prob at that time... the colon was neg (sl tortuous) & rec for hi fiber diet. ~  GYN:  DrHenley for PAP, etc... She reports up to date &  baseline screening Mammogram 9/13  In [place] was neg...  ~  Immunizations:  she is up to date w/ Flu shot yearly thru [facility] & had Tetanus shot ~2000 (she will inquire re 75yr update vaccination at work)...  ALLERGIC RHINITIS (ICD-477.9) - she takes OTC antihist Prn... ~  Last CXR here was 9/11> normal heart size, clear lungs, NAD ~  EKG 11/14 showed NSR, rate63wnl, NAD...  HYPERCHOLESTEROLEMIA, BORDERLINE (ICD-272.4) - prev on Lipitor Rx, she prefers to control w/ diet alone... ~  FLP 1/07 showed TChol 244, TG 63, HDL 73, LDL 156... rec> Lip10 but pt stopped on her own. ~  FLP 11/08 showed TChol 204, TG 72, HDL 71, LDL 123 ~  FLP 11/10 showed TChol 192, TG 59, HDL 60, LDL 120 ~  FLP 9/11 showed TChol 196, TG 56, HDL 64, LDL 121 ~  FLP 1/12 showed TChol 185, TG 46, HDL 65, LDL 111 ~  FLP 7/13 showed TChol 196, TG 56, HDL 64, LDL 121 ~  FLP 10/14 showed TChol 184, TG 67, HDL 67, LDL 104  Hx of DYSPEPSIA (NWG-956.2) - she uses OTC H2 blockers Prn...  IRRITABLE BOWEL SYNDROME (ICD-564.1) - hx IBS w/ diarrhea & eval 2000 by DrStark> colonoscopy was neg x tortuous colon & rec for incr fiber & Bentyl Prn... ~  she has had 2 severe gastroenteritis bouts one in 2007 w/ overnight hosp for IVF & the other in 2011 treated in ER similarly...  Hx of LUMBOSACRAL STRAIN (ICD-846.0) Hx Right KNEE pain >> she is a runner & saw [name] w/ shot in knee & resolved; she uses sleeve brace while running...   History reviewed. No pertinent past surgical history.   Outpatient Encounter Prescriptions as of 03/29/2014  Medication Sig  . ALPRAZolam (XANAX) 0.25 MG tablet Take 1/2 to 1 tablet by mouth three times daily as needed  . b complex vitamins tablet Take 1 tablet by mouth daily.  Alphonsus Sias Pollen 1000 MG TABS Take 1 tablet by mouth daily.  . Calcium-Vitamin D-Vitamin K (VIACTIV) 500-500-40 MG-UNT-MCG CHEW 1 daily  . ibuprofen (ADVIL,MOTRIN) 200 MG tablet Take 200 mg by mouth every 6 (six) hours as needed.   . Multiple Vitamin (MULTIVITAMIN) capsule Take 1 capsule by mouth daily.    Allergies  Allergen Reactions  . Penicillins     rash    Current Medications, Allergies, Past Medical History, Past Surgical History, Family History, and Social History were reviewed in [employer]  medical record.   Review of Systems    The patient denies fever, chills, sweats, anorexia, fatigue, weakness, malaise, weight loss, sleep disorder, blurring, diplopia, eye irritation, eye discharge, vision loss, eye pain, photophobia, earache, ear discharge, tinnitus, decreased hearing, nasal congestion, nosebleeds, sore throat, hoarseness, chest pain, palpitations, syncope, dyspnea on exertion, orthopnea, PND, peripheral edema, cough, dyspnea at rest, excessive sputum, hemoptysis, wheezing, pleurisy, nausea, vomiting, diarrhea, constipation, change in bowel habits, abdominal pain, melena, hematochezia, jaundice, gas/bloating, indigestion/heartburn, dysphagia, odynophagia, dysuria, hematuria, urinary frequency, urinary hesitancy, nocturia, incontinence, back pain, joint pain, joint swelling, muscle cramps, muscle weakness, stiffness, arthritis, sciatica, restless legs, leg pain at night, leg pain with exertion, rash, itching, dryness, suspicious lesions, paralysis, paresthesias, seizures, tremors, vertigo, transient blindness, frequent falls, frequent headaches, difficulty walking, depression, anxiety, memory loss, confusion, cold intolerance, heat intolerance, polydipsia, polyphagia, polyuria, unusual weight change, abnormal bruising, bleeding, enlarged lymph nodes, urticaria, allergic rash, hay fever, and recurrent infections.     Objective:   Physical Exam     WD, WN, 43 y/o WF in NAD... GENERAL:  Alert & oriented; pleasant & cooperative... HEENT:  [place]/AT, EOM-wnl, PERRLA, Fundi-benign, EACs-clear, TMs-wnl, NOSE-clear, THROAT-clear & wnl. NECK:  Supple w/ full ROM; no JVD; normal carotid impulses w/o  bruits; no thyromegaly or nodules palpated; no lymphadenopathy. CHEST:  Clear to P & A; without wheezes/ rales/ or rhonchi. HEART:  Regular Rhythm; without murmurs/ rubs/ or gallops. ABDOMEN:  Soft & nontender; normal bowel sounds; no organomegaly or masses detected. EXT: without deformities or arthritic changes; no varicose veins/ venous insuffic/ or edema. NEURO:  CN's intact; motor testing normal; sensory testing normal; gait normal & balance OK. DERM:  No lesions noted; no rash etc...  RADIOLOGY DATA:  Reviewed in the EPIC EMR & discussed w/ the patient...    <<CXR 9/11>> normal heart size, clear lungs, NAD...    <<EKG 11/14 showed NSR, rate63wnl, NAD...  LABORATORY DATA:  Reviewed in the EPIC EMR & discussed w/ the patient...   Assessment:      Right flank area pain of unknown etiology>  See above eval, nothing found & we will continue observation, watchful waiting, prn Vicodin for pain...    AR>  Min symptoms in the spring treated w/ OTC meds as needed...  CHOL>  She has mild hypercholesterolemia w/ LDLs~120 range for yrs; Mother had incr Chol & MI, stroke, but was a smoker as well; she does not want med rx so we reviewed diet etc...  Hx dyspepsia>  Remote hx & doing satis; she knows to use Zantac/ Pepcid vs PPI if symptoms recur...  IBS>  Stable & essentially asymptomatic now on fiber, Metamucil, etc...  Ortho>  She is a runner & saw [name] for shot in her knee & improved; also has sm ?ganglion cyst on right inder finger- she will hotsoak it & f/u w/ drGramig if not resolved...     Plan:     Patient's Medications  New Prescriptions   No medications on file  Previous Medications   ALPRAZOLAM (XANAX) 0.25 MG TABLET    Take 1/2 to 1 tablet by mouth three times daily as needed   B COMPLEX VITAMINS TABLET    Take 1 tablet by mouth daily.   BEE POLLEN 1000 MG TABS    Take 1 tablet by mouth daily.   CALCIUM-VITAMIN D-VITAMIN K (VIACTIV) 500-500-40 MG-UNT-MCG CHEW    1  daily   IBUPROFEN (ADVIL,MOTRIN) 200 MG TABLET    Take 200 mg by  mouth every 6 (six) hours as needed.   MULTIPLE VITAMIN (MULTIVITAMIN) CAPSULE    Take 1 capsule by mouth daily.  Modified Medications   No medications on file  Discontinued Medications   No medications on file

## 2014-03-30 DIAGNOSIS — N200 Calculus of kidney: Secondary | ICD-10-CM | POA: Insufficient documentation

## 2014-03-31 ENCOUNTER — Telehealth: Payer: Self-pay | Admitting: Gastroenterology

## 2014-03-31 ENCOUNTER — Telehealth: Payer: Self-pay | Admitting: Pulmonary Disease

## 2014-03-31 DIAGNOSIS — R109 Unspecified abdominal pain: Secondary | ICD-10-CM

## 2014-03-31 MED ORDER — AZITHROMYCIN 250 MG PO TABS: ORAL_TABLET | ORAL | Status: DC

## 2014-03-31 NOTE — Telephone Encounter (Signed)
Spoke with Aleida at Dr. Kriste Basque and scheduled patient on 04/03/14 at 1:30 PM with Mike Gip, PA.

## 2014-03-31 NOTE — Telephone Encounter (Signed)
Per SN---  Pt will need to have ASAP appt with GI on Monday or Tuesday if possible for further eval of right side pain.  i have called and spoke with pt and she is aware.  Pt is ok with scheduling this appt.  She is also aware that zpak has been sent to her pharmacy to help out with the URI that she has.  Nothing further is needed.

## 2014-03-31 NOTE — Telephone Encounter (Signed)
Called and spoke with pt and she stated that SN spoke with her GYN yesterday.  She stated that she has been taking the vicodin 1 tab every 4 hours as needed.  She stated that the meds do not really stop the pain.  She stated that she did not work today due to this, so she has been taking it easy today.  She wanted to see if SN had any further recs for anything else that she may try.  SN please advise. Thanks  Allergies  Allergen Reactions  . Penicillins     rash    Current Outpatient Prescriptions on File Prior to Visit  Medication Sig Dispense Refill  . ALPRAZolam (XANAX) 0.25 MG tablet Take 1/2 to 1 tablet by mouth three times daily as needed  90 tablet  5  . b complex vitamins tablet Take 1 tablet by mouth daily.      Alphonsus Sias Pollen 1000 MG TABS Take 1 tablet by mouth daily.      . Calcium-Vitamin D-Vitamin K (VIACTIV) 500-500-40 MG-UNT-MCG CHEW 1 daily      . HYDROcodone-acetaminophen (NORCO/VICODIN) 5-325 MG per tablet Take 1 tablet by mouth every 4-6 hours as needed for pain  30 tablet  0  . ibuprofen (ADVIL,MOTRIN) 200 MG tablet Take 200 mg by mouth every 6 (six) hours as needed.      . Multiple Vitamin (MULTIVITAMIN) capsule Take 1 capsule by mouth daily.       No current facility-administered medications on file prior to visit.

## 2014-04-03 ENCOUNTER — Ambulatory Visit (INDEPENDENT_AMBULATORY_CARE_PROVIDER_SITE_OTHER): Payer: Commercial Managed Care - PPO | Admitting: Physician Assistant

## 2014-04-03 ENCOUNTER — Ambulatory Visit (INDEPENDENT_AMBULATORY_CARE_PROVIDER_SITE_OTHER): Payer: Commercial Managed Care - PPO

## 2014-04-03 ENCOUNTER — Encounter: Payer: Self-pay | Admitting: Physician Assistant

## 2014-04-03 VITALS — BP 104/70 | HR 84 | Ht 58.5 in | Wt 103.5 lb

## 2014-04-03 DIAGNOSIS — R1031 Right lower quadrant pain: Secondary | ICD-10-CM

## 2014-04-03 LAB — URINALYSIS, ROUTINE W REFLEX MICROSCOPIC
Bilirubin Urine: NEGATIVE
Hgb urine dipstick: NEGATIVE
Ketones, ur: NEGATIVE
Leukocytes, UA: NEGATIVE
Nitrite: NEGATIVE
Specific Gravity, Urine: 1.015 (ref 1.000–1.030)
Total Protein, Urine: NEGATIVE
Urine Glucose: NEGATIVE
Urobilinogen, UA: 0.2 (ref 0.0–1.0)
WBC, UA: NONE SEEN — AB (ref 0–?)
pH: 7 (ref 5.0–8.0)

## 2014-04-03 LAB — CBC WITH DIFFERENTIAL/PLATELET
Basophils Absolute: 0 10*3/uL (ref 0.0–0.1)
Basophils Relative: 0.5 % (ref 0.0–3.0)
Eosinophils Absolute: 0.1 10*3/uL (ref 0.0–0.7)
Eosinophils Relative: 0.7 % (ref 0.0–5.0)
HCT: 38.6 % (ref 36.0–46.0)
Hemoglobin: 13.2 g/dL (ref 12.0–15.0)
Lymphocytes Relative: 18 % (ref 12.0–46.0)
Lymphs Abs: 1.5 10*3/uL (ref 0.7–4.0)
MCHC: 34.2 g/dL (ref 30.0–36.0)
MCV: 93.7 fl (ref 78.0–100.0)
Monocytes Absolute: 0.6 10*3/uL (ref 0.1–1.0)
Monocytes Relative: 6.7 % (ref 3.0–12.0)
Neutro Abs: 6.3 10*3/uL (ref 1.4–7.7)
Neutrophils Relative %: 74.1 % (ref 43.0–77.0)
Platelets: 280 10*3/uL (ref 150.0–400.0)
RBC: 4.12 Mil/uL (ref 3.87–5.11)
RDW: 12.1 % (ref 11.5–15.5)
WBC: 8.5 10*3/uL (ref 4.0–10.5)

## 2014-04-03 LAB — HIGH SENSITIVITY CRP: CRP, High Sensitivity: 2.34 mg/L (ref 0.000–5.000)

## 2014-04-03 MED ORDER — GLYCOPYRROLATE 2 MG PO TABS: ORAL_TABLET | ORAL | Status: DC

## 2014-04-03 NOTE — Patient Instructions (Signed)
Please go to the basement level to have your labs drawn and urine test.  We sent a prescription for Robinul Forte  2 mg to [facility] [address]. Call us Thursday if  pain is still persisting and we will get a Ct scan with contrast.

## 2014-04-03 NOTE — Progress Notes (Signed)
Subjective:    Patient ID: Natalie Ford, female    DOB: [birth year], 43 y.o.   MRN: [id number]  HPI  Natalie Ford is a pleasant 43 year old white female known remotely to Dr. Russella Dar from a colonoscopy done in 2000 Patient states this was negative and she was told she had IBS. She is referred today by Dr. Kriste Basque  with complaints of acute right lower quadrant pain which is been present over the past 2 weeks. She had CT scan of the abdomen and pelvis done without IV or oral contrast at [facility] on 03/29/2014 This was done for kidney stone protocol and showed a small left renal calculus no definite right renal calculi and no hydronephrosis there was a probable small amount of free fluid in the cul-de-sac and otherwise negative exam. She had had a CBC and UA done through their health clinic there and those were also unremarkable. She has been to her gynecologist Dr. Ambrose Mantle  with this same pain last week and he stated that the fluid in the cul-de-sac was considered normal and he did not find anything on pelvic exam. Patient comes here because she is continuing to hurt. Her pain is in the right lower quadrant right flank area she describes it as a constant dull aching pain 4-5/10 and says at times it will get more intense up to a 7 or 8 level. She's had some vague nausea no vomiting and did have some low-grade fevers last week in the 99-100 range. She has no dysuria frequency or hematuria, no change in her bowel habits but says she always has 3-4 bowel movements per day with her IBS no melena or hematochezia. Her appetite has been decreased over the past few months and she may have lost 45 pounds. She's not on any regular aspirin or NSAIDs. Only abdominal surgery was C-section x2. Patient has not noticed any change  in  pain with by mouth intake or position. She is a runner and says she has not noticed any change in her pain with running. Today she also notes some left lower quadrant pain which just  started today. She has had Vicodin at home 2 use over the past few days and has been taking this sparingly.    Review of Systems  Constitutional: Positive for fever, appetite change and unexpected weight change.  HENT: Negative.   Eyes: Negative.   Respiratory: Negative.   Cardiovascular: Negative.   Gastrointestinal: Positive for abdominal pain and diarrhea.  Endocrine: Negative.   Genitourinary: Positive for flank pain.  Musculoskeletal: Negative.   Skin: Negative.   Allergic/Immunologic: Negative.   Neurological: Negative.   Hematological: Negative.   Psychiatric/Behavioral: Negative.    Outpatient Prescriptions Prior to Visit  Medication Sig Dispense Refill  . ALPRAZolam (XANAX) 0.25 MG tablet Take 1/2 to 1 tablet by mouth three times daily as needed  90 tablet  5  . azithromycin (ZITHROMAX) 250 MG tablet Take as directed  6 tablet  0  . b complex vitamins tablet Take 1 tablet by mouth as needed.       Alphonsus Sias Pollen 1000 MG TABS Take 1 tablet by mouth as needed.       . Calcium-Vitamin D-Vitamin K (VIACTIV) 500-500-40 MG-UNT-MCG CHEW Chew 1 tablet by mouth as needed.       [name] HYDROcodone-acetaminophen (NORCO/VICODIN) 5-325 MG per tablet Take 1 tablet by mouth every 4-6 hours as needed for pain  30 tablet  0  . Multiple Vitamin (MULTIVITAMIN) capsule  Take 1 capsule by mouth as needed.       [name] ibuprofen (ADVIL,MOTRIN) 200 MG tablet Take 200 mg by mouth every 6 (six) hours as needed.       No facility-administered medications prior to visit.   Allergies  Allergen Reactions  . Penicillins     rash   Patient Active Problem List   Diagnosis Date Noted  . Kidney stones 03/30/2014  . Physical exam, annual 06/29/2013  . Anxiety 06/29/2013  . HYPERCHOLESTEROLEMIA, BORDERLINE 05/14/2010  . DYSPEPSIA 05/14/2010  . LUMBOSACRAL STRAIN 05/14/2010  . ALLERGIC RHINITIS 05/15/2009  . UPPER RESPIRATORY INFECTION 05/09/2009  . GASTROENTERITIS 12/25/2007  . IRRITABLE BOWEL SYNDROME  12/25/2007   History  Substance Use Topics  . Smoking status: Never Smoker   . Smokeless tobacco: Never Used     Comment: exposed to second hand smoke  . Alcohol Use: Yes     Comment: social use   family history includes Diabetes in her mother; Heart disease in her mother; Kidney disease in her paternal aunt; Leukemia in her father; Stomach cancer in her paternal grandfather.     Objective:   Physical Exam    well-developed white female in no acute distress, pleasant blood pressure 104/70 pulse 84 height 4 foot 10 weight 103. HEENT; nontraumatic normocephalic EOMI PERRLA sclera anicteric, Supple ;no JVD, Cardiovascular; regular rate and rhythm with S1-S2 no murmur or gallop, Pulmonary; clear bilaterally, Abdomen; soft bowel sounds are present she is minimally tender with deep pressure in the right lower quadrant no guarding or rebound no palpable mass or hepatosplenomegaly, no rash, Rectal ;exam not done, Extremities; no clubbing cyanosis or edema skin warm and dry, Psych ;mood and affect appropriate        Assessment & Plan:  #53 43 year old female generally healthy with history of IBS presenting with two-week history of persistent right lower quadrant/right flank pain which has been dull and not radiating. Noncontrasted CT scan was unremarkable with the exception of small amount of fluid in the cul-de-sac and a left renal stone. Etiology of her pain is not clear she did have some low-grade temp last week and wonder if she may have low-grade inflammatory process, consider mesenteric adenitis etc. which may not have been seen on noncontrasted CT scan, she may also have ruptured an ovarian cyst.  Plan; repeat CBC with differential and UA, today ,check CRP Start Robinul Forte 2 mg by mouth every morning up to twice daily as needed for right lower quadrant pain/spasm She is asked to call the office in 3-4 days and if her pain is still persisting would pursue regular CT of the abdomen and pelvis  with IV and oral contrast Her old chart has been requested and will review her previous colonoscopy as well

## 2014-04-05 ENCOUNTER — Encounter: Payer: Self-pay | Admitting: *Deleted

## 2014-04-05 ENCOUNTER — Telehealth: Payer: Self-pay | Admitting: Physician Assistant

## 2014-04-05 ENCOUNTER — Other Ambulatory Visit: Payer: Self-pay | Admitting: *Deleted

## 2014-04-05 DIAGNOSIS — R1084 Generalized abdominal pain: Secondary | ICD-10-CM

## 2014-04-05 DIAGNOSIS — R109 Unspecified abdominal pain: Secondary | ICD-10-CM

## 2014-04-05 NOTE — Telephone Encounter (Signed)
Labs have not been reviewed by Mike Gip, PA yet but are normal but urine culture is not final yet. Patient states she is still having pain. Pain was worse yesterday afternoon. She states it is less today but still there. She reports she did not run yesterday due to pain and she slept with a heating pad.

## 2014-04-06 NOTE — Progress Notes (Signed)
Reviewed and agree with management plan.  Willma Obando T. Lumi Winslett, MD FACG 

## 2014-04-06 NOTE — Telephone Encounter (Signed)
That is fine-just need to be sure we get faxed the report- will need to show report to Md next week as I will be out of town ,[name]

## 2014-04-06 NOTE — Telephone Encounter (Signed)
Spoke with Natalie Ford in [facility] and scheduled CT abd, pelvis with contrast on 04/07/14 at 8:00 AM. Register at 7:30 AM. Pick up contrast today. Patient notified.Cancelled CT at [facility].

## 2014-04-06 NOTE — Telephone Encounter (Signed)
Yes, on 04/11/14.

## 2014-04-06 NOTE — Telephone Encounter (Signed)
Per our discussion yesterday - pt is scheduled for CT abd/pelvis with contrast correct?

## 2014-04-06 NOTE — Telephone Encounter (Signed)
Spoke with patient and she wants to move CT to [facility]. Faxed request to [phone].

## 2014-04-07 LAB — URINE CULTURE
Colony Count: NO GROWTH
Organism ID, Bacteria: NO GROWTH

## 2014-04-10 ENCOUNTER — Telehealth: Payer: Self-pay | Admitting: Physician Assistant

## 2014-04-10 DIAGNOSIS — R935 Abnormal findings on diagnostic imaging of other abdominal regions, including retroperitoneum: Secondary | ICD-10-CM

## 2014-04-10 NOTE — Telephone Encounter (Signed)
Had CT reviewed by Willette Cluster, NP ,  Nothing that is urgent to be done. Large amount of stool in rectum. Needs to get it out with enemas or suppositories. CBD and pancreatic duct is prominent. Needs LFT's drawn. Dr. Russella Dar will review and if needed schedule MRCP. Left a message for patient to call me back.

## 2014-04-10 NOTE — Telephone Encounter (Signed)
Spoke with patient and gave her results and recommendation. She will come for lab. Lab in [facility].

## 2014-04-11 ENCOUNTER — Other Ambulatory Visit (INDEPENDENT_AMBULATORY_CARE_PROVIDER_SITE_OTHER): Payer: Commercial Managed Care - PPO

## 2014-04-11 ENCOUNTER — Inpatient Hospital Stay: Admission: RE | Admit: 2014-04-11 | Payer: Commercial Managed Care - PPO | Source: Ambulatory Visit

## 2014-04-11 DIAGNOSIS — R935 Abnormal findings on diagnostic imaging of other abdominal regions, including retroperitoneum: Secondary | ICD-10-CM

## 2014-04-11 LAB — HEPATIC FUNCTION PANEL
ALT: 18 U/L (ref 0–35)
AST: 17 U/L (ref 0–37)
Albumin: 4.2 g/dL (ref 3.5–5.2)
Alkaline Phosphatase: 44 U/L (ref 39–117)
Bilirubin, Direct: 0.1 mg/dL (ref 0.0–0.3)
Total Bilirubin: 0.7 mg/dL (ref 0.2–1.2)
Total Protein: 7.1 g/dL (ref 6.0–8.3)

## 2014-04-12 ENCOUNTER — Telehealth: Payer: Self-pay | Admitting: Physician Assistant

## 2014-04-12 ENCOUNTER — Encounter: Payer: Self-pay | Admitting: Gastroenterology

## 2014-04-12 NOTE — Telephone Encounter (Signed)
Patient given results. See result note. 

## 2014-04-13 ENCOUNTER — Encounter: Payer: Self-pay | Admitting: Gastroenterology

## 2014-04-19 ENCOUNTER — Telehealth: Payer: Self-pay | Admitting: *Deleted

## 2014-04-19 DIAGNOSIS — R109 Unspecified abdominal pain: Secondary | ICD-10-CM

## 2014-04-19 NOTE — Telephone Encounter (Signed)
Spoke with patient and gave her results and recommendations. She will come for repeat LFT. She is having bowel movements 3-4/day. She continues to have left side pain that requires her to take pain medications daily.

## 2014-04-19 NOTE — Telephone Encounter (Signed)
Message copied by Daphine Deutscher on Wed Apr 19, 2014  2:32 PM ------      Message from: [place], [place] S      Created: Wed Apr 19, 2014  1:36 PM       Rene Kocher- please call pt let her know Dr. Russella Dar reviewed ct as well- Ct shows minimal CBD dilation to 7 mm . Liver tests normal- would like to repeat liver tests again and if normal , no furtherr w/u needed at present.  Also had a large amy of stool in rectum. See how she is doing.. Still having pain, make sure not constipated etc thanks ------

## 2014-04-20 ENCOUNTER — Telehealth: Payer: Self-pay | Admitting: Pulmonary Disease

## 2014-04-20 DIAGNOSIS — R109 Unspecified abdominal pain: Secondary | ICD-10-CM

## 2014-04-20 MED ORDER — HYDROCODONE-ACETAMINOPHEN 5-325 MG PO TABS: ORAL_TABLET | ORAL | Status: DC

## 2014-04-20 NOTE — Telephone Encounter (Signed)
OK- may need to proceed with colonoscopy  With Dr. Russella Dar. Can go ahead and schedule or if she would rather see him first can schedule office visit with him.. Thanks,. i do not know what is causing her pain but colonosocpy will complete eval.

## 2014-04-20 NOTE — Telephone Encounter (Signed)
Per SN--  Ok to refill the hydrocodone.  i will leave this up front and she can come by this afternoon to pick this up.  Please have her keep SN informed of what GI will be doing for her.  thanks

## 2014-04-20 NOTE — Addendum Note (Signed)
Addended by: Daphine Deutscher on: 04/20/2014 01:24 PM   Modules accepted: Orders

## 2014-04-20 NOTE — Telephone Encounter (Signed)
Patient wants to do lab at [facility]. Faxed order to [phone]. Scheduled OV with Dr. Russella Dar on 05/09/14 at 11:15 AM. Patient aware.

## 2014-04-20 NOTE — Telephone Encounter (Signed)
Patient wants to call back and she will let me know what she wants to do then.

## 2014-04-20 NOTE — Telephone Encounter (Signed)
Pt requesting refill for Hydrocodone.  Last filled on 03/29/14 for # 30 tablets.  Please advise if ok to refill.

## 2014-04-20 NOTE — Telephone Encounter (Signed)
Left a message for patient to call back. 

## 2014-04-20 NOTE — Telephone Encounter (Signed)
Spoke with pt and advised that rx will be left at front for pick up.  She as had another CT scan but is still waiting to have more blood work.  Will let Dr Kriste Basque know when this is complete.

## 2014-04-25 ENCOUNTER — Telehealth: Payer: Self-pay

## 2014-04-25 NOTE — Telephone Encounter (Signed)
Called and left patient message notifying that her recent liver function tests done at [facility] system are all normal to call back with any questions or concerns.

## 2014-05-02 ENCOUNTER — Encounter: Payer: Self-pay | Admitting: Gastroenterology

## 2014-05-09 ENCOUNTER — Ambulatory Visit: Payer: Commercial Managed Care - PPO | Admitting: Gastroenterology

## 2016-06-02 ENCOUNTER — Encounter: Payer: Self-pay | Admitting: Pulmonary Disease

## 2017-09-24 ENCOUNTER — Telehealth: Payer: Self-pay | Admitting: Pulmonary Disease

## 2017-09-24 NOTE — Telephone Encounter (Signed)
Per SN: Absolutely, we'll see her anytime,  Left detailed message on pt's voicemail informing her of the above and asking her to call the office at her convenience for appt.  Pt is also active on MyChart and she can e-mail for appt.  Will leave message open for now

## 2017-09-24 NOTE — Telephone Encounter (Signed)
Called and left a message for pt to return our call.  In the meantime, will ask Dr. Kriste Basque to see if he will accept pt scheduling an OV with him since she has not been seen since 03/2014.

## 2017-09-30 ENCOUNTER — Telehealth: Payer: Self-pay | Admitting: Pulmonary Disease

## 2017-09-30 DIAGNOSIS — Z0000 Encounter for general adult medical examination without abnormal findings: Secondary | ICD-10-CM

## 2017-09-30 NOTE — Telephone Encounter (Signed)
Per SN:  Order the following labs: CMET, CBC with diff, and TSH She will also need a Chest x-ray for follow up since her last one was in 2014.   Orders placed and message left on machine for patient informing her that orders were placed.

## 2017-09-30 NOTE — Telephone Encounter (Signed)
SN: pt has ov tomorrow at 9:30 am  Can you please advise what labs you want ordered for her? Thanks

## 2017-10-01 ENCOUNTER — Ambulatory Visit: Payer: No Typology Code available for payment source | Admitting: Pulmonary Disease

## 2017-10-01 ENCOUNTER — Other Ambulatory Visit (INDEPENDENT_AMBULATORY_CARE_PROVIDER_SITE_OTHER): Payer: No Typology Code available for payment source

## 2017-10-01 ENCOUNTER — Encounter: Payer: Self-pay | Admitting: Pulmonary Disease

## 2017-10-01 ENCOUNTER — Ambulatory Visit (INDEPENDENT_AMBULATORY_CARE_PROVIDER_SITE_OTHER)
Admission: RE | Admit: 2017-10-01 | Discharge: 2017-10-01 | Disposition: A | Discharge status: Home / Self Care | Payer: No Typology Code available for payment source | Source: Ambulatory Visit | Attending: Pulmonary Disease | Admitting: Pulmonary Disease

## 2017-10-01 VITALS — BP 112/72 | HR 76 | Temp 98.6°F | Ht 58.5 in | Wt 116.2 lb

## 2017-10-01 DIAGNOSIS — J301 Allergic rhinitis due to pollen: Secondary | ICD-10-CM | POA: Diagnosis not present

## 2017-10-01 DIAGNOSIS — K589 Irritable bowel syndrome without diarrhea: Secondary | ICD-10-CM | POA: Diagnosis not present

## 2017-10-01 DIAGNOSIS — Z0000 Encounter for general adult medical examination without abnormal findings: Secondary | ICD-10-CM

## 2017-10-01 DIAGNOSIS — G5602 Carpal tunnel syndrome, left upper limb: Secondary | ICD-10-CM

## 2017-10-01 DIAGNOSIS — E7800 Pure hypercholesterolemia, unspecified: Secondary | ICD-10-CM | POA: Diagnosis not present

## 2017-10-01 DIAGNOSIS — G5600 Carpal tunnel syndrome, unspecified upper limb: Secondary | ICD-10-CM | POA: Insufficient documentation

## 2017-10-01 LAB — COMPREHENSIVE METABOLIC PANEL
ALT: 19 U/L (ref 0–35)
AST: 19 U/L (ref 0–37)
Albumin: 4.8 g/dL (ref 3.5–5.2)
Alkaline Phosphatase: 45 U/L (ref 39–117)
BUN: 10 mg/dL (ref 6–23)
CO2: 26 mEq/L (ref 19–32)
Calcium: 9.4 mg/dL (ref 8.4–10.5)
Chloride: 104 mEq/L (ref 96–112)
Creatinine, Ser: 0.78 mg/dL (ref 0.40–1.20)
GFR: 84.36 mL/min (ref >60.00)
Glucose, Bld: 118 mg/dL — ABNORMAL HIGH (ref 70–99)
Potassium: 4.2 mEq/L (ref 3.5–5.1)
Sodium: 139 mEq/L (ref 135–145)
Total Bilirubin: 1 mg/dL (ref 0.2–1.2)
Total Protein: 7.4 g/dL (ref 6.0–8.3)

## 2017-10-01 LAB — CBC WITH DIFFERENTIAL/PLATELET
Basophils Absolute: 0.1 10*3/uL (ref 0.0–0.1)
Basophils Relative: 0.8 % (ref 0.0–3.0)
Eosinophils Absolute: 0.1 10*3/uL (ref 0.0–0.7)
Eosinophils Relative: 0.7 % (ref 0.0–5.0)
HCT: 43.6 % (ref 36.0–46.0)
Hemoglobin: 14.8 g/dL (ref 12.0–15.0)
Lymphocytes Relative: 22.2 % (ref 12.0–46.0)
Lymphs Abs: 1.6 10*3/uL (ref 0.7–4.0)
MCHC: 33.9 g/dL (ref 30.0–36.0)
MCV: 94.9 fl (ref 78.0–100.0)
Monocytes Absolute: 0.4 10*3/uL (ref 0.1–1.0)
Monocytes Relative: 5.8 % (ref 3.0–12.0)
Neutro Abs: 5 10*3/uL (ref 1.4–7.7)
Neutrophils Relative %: 70.5 % (ref 43.0–77.0)
Platelets: 244 10*3/uL (ref 150.0–400.0)
RBC: 4.59 Mil/uL (ref 3.87–5.11)
RDW: 12.2 % (ref 11.5–15.5)
WBC: 7.1 10*3/uL (ref 4.0–10.5)

## 2017-10-01 LAB — TSH: TSH: 1.43 u[IU]/mL (ref 0.35–4.50)

## 2017-10-01 NOTE — Progress Notes (Signed)
Subjective:     Patient ID: Natalie Ford, female   DOB: 1971/02/11, 47 y.o.   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 I saw [name] 2/07- good general health> hx AR, IBS, anxiety on no regular meds... she saw TP 2010- dong well, labs all normal x LDL= 120 on diet Rx alone... she has been running 2-68mi/d 4-5d per wk & weight down 5# to 122# (59" tall & BMI ~25)... no new complaints or concerns; she is working on her BSN & will be completing her classes at [employer]...  ~  March 04, 2012:  54mo ROV & CPX> [name] is working weekend option at [employer] in the [place] section; also works part time for [employer], [employer]; [name] is going into 9th grade & [name] into 4th; she & Judie Grieve have been separated again ~66yr;  Medically she is doing quite well- just a few ortho issues> she is a runner & has actually lost ~9# down to 113# & we discussed not going any lower, add in a nutritional supplement, etc; she saw [name] for shot in left knee & has been doing fine since then w/ sleeve brace while working out; she has noted a sm knot on right index finger- ?ganglion cyst & we discussed warm soaks & observ for now- refer to DrGramig if persists or gets larger;  She also notes spider veins on legs- not severe & we discussed VVS eval if she desires...    We reviewed prob list, meds (no regular meds, just Vits etc), xrays and labs> see below for updates>>  LABS 7/13:  FLP- ok x LDL=121;  Chems- wnl;  CBC- wnl;  TSH=1.66;  UA- clear   ~  June 29, 2013:  Yearly ROV & CPX> [name] continues to do well medically- no new complaints or concerns today; [name] is now 40, in 11th grade & attending [employer], plays volleyball & wants to be an [employer] agent; [name] is 10, in the 5th grade and plays baseball; [name] is finishing her schooling at [employer] for her BSN.... Previously she was feeling tired but not resting well etc; now she  is feeling better- active & on the go, rests well, energy good etc...  Weight is down 4# further to 109#, she is on no regular meds just vits and Tylenol...     We reviewed prob list, meds, xrays and labs> see below for updates >> she had the 2014 FLU vaccine in Oct...   LABS 10/14:  FLP- at goals on diet alone;  Chems- wnl, BS=107;  CBC-wnl;  TSH=0.93...   EKG 11/14 showed NSR, rate63wnl, NAD.Marland KitchenMarland Kitchen   ~  March 29, 2014:  18mo ROV & add-on appt requested for right flank area pain> started about 3wks ago- dull, mod severe, comes & goes w/ exac to a 7-8 scale & may last for hours; feels better to move around 7 has actually been jogging w/ the discomfort (pain not made worse etc); notes some nausea, no vomiting, no abd pain or ch in BMs etc; states hx kidney stone as teen (passed it), then cut back on milk & no known recurrence- states urine looks ok, no burning, no blood, no other symptoms...   She went to employee health at [facility] & they did LABS= Chems- wnl, CBC- wnl, UA- clear;  Exam is neg- Abd soft & non-tender, no masses, no skin lesions seen etc...  We  decided to check CT Abd/Pelvis w/ kid stone protocol at [facility]=> done 03/29/14 & they called report: NEG w/o reason for right sided pain apparent (sm nonobstructing left renal calculi seen, none on right, no hydroureter, neg appendix, sm amt of fuid noted in cul-de-sac);  We suggested OV eval by Gyn & she saw [name] (he called to report neg Gyn exam, noting that the fluid in the cul-de-sac is an insignif/ normal finding)...   She is rec to rest, & we will observe on watchful waiting protocol- take VICODIN as needed for pain, observe for any rash, call for any changes or questions...     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  Keyundra continues to exercise regularly & doing well overall- her only c/o left carpal tunnel symptoms, she has a brace & has been using it but recall that she prev had right CTS surg yrs ago- we will refer to the [facility] at her request... She had a scare last yr w/ ?abn on mammogram but subseq additional views & tomo was NEG...     Exam shows Afeb, VSS, O2sat=96% on RA;  HEENT- neg, mallampati1;  Chest- clear w/o w/r/r;  Heart- RR w/o m/r/g;  Abd- soft, nontender, neg;  Ext- neg w/o c/c/e;  Neuro- intact...     We reviewed prob list, meds (no regular meds, just Vits etc), xrays and labs> see below for updates>>  CXR 10/01/17 (independently reviewed by me in the PACS system) showed norm heart size, clear lungs- NAD...  LABS 10/01/17>  FLP- ok x LDL=118;  Chems- wnl x FBS=118;  CBC- wnl;  TSH=1.43 IMP/PLAN>>  SEE PROB LIST below-- good general medical health             Problem List:    HEALTH MAINTENANCE EXAM (ICD-V70.0) ~  GI:  she had a colonoscopy 2000 by DrStark because of diarrhea prob at that time... the colon was neg (sl tortuous) & rec for hi fiber diet; she will be due for routine screening colon at age 10... ~  GYN:  DrHenley for PAP, etc.. She is due for f/u mammograms w/ her last exam 04/2016 (in [place]) w/ ?abnormality seen=> f/u views & tomo showed superimposed shadows and no lesion detected... ~  Immunizations:  she is up to date w/ Flu shots yearly thru [facility] in past & now at work & had TDaP 2012;  She was treated for a Shingles outbreak by DrCottle last yr=> will get the Shingrix when she turns 50...  ALLERGIC RHINITIS (ICD-477.9) - she takes OTC antihist Prn... ~  Last CXR here was 9/11> normal heart size, clear lungs, NAD;  Follow up film 02/2018 is similarly WNL... ~  EKG 11/14 showed NSR,  rate63- wnl, NAD...  HYPERCHOLESTEROLEMIA, BORDERLINE (ICD-272.4) - prev on Lipitor Rx, she prefers to control w/ diet alone... ~  FLP 1/07 showed TChol 244, TG 63, HDL 73, LDL 156... rec> Lip10 but pt stopped on her own. ~  FLP 11/08 showed TChol 204, TG 72, HDL 71, LDL 123 ~  FLP 11/10 showed TChol 192, TG 59, HDL 60, LDL 120 ~  FLP 9/11 showed TChol 196, TG 56, HDL 64, LDL 121 ~  FLP 1/12 showed TChol 185, TG 46, HDL 65, LDL 111 ~  FLP 7/13 showed TChol 196, TG 56, HDL 64, LDL 121 ~  FLP 10/14 showed TChol 184, TG 67, HDL 67, LDL 104 ~  FLP 2/19 showed TChol 192, TG 45, HDL 65, LDL 118 => stable & we reviewed low chol diet & exercise...  IMPAIRED FASTING GLUCOSE - there is a +FamHx of DM (mother had diet controlled DM, & sister has DM2 on meds/ no insulin) ~  Labs 2/19 showed FBS=118 and we discussed low carb diet & avoiding foods w/ hi glycemic index...  Hx of DYSPEPSIA (ICD-536.8) - she uses OTC H2 blockers Prn...  IRRITABLE BOWEL SYNDROME (ICD-564.1) - hx IBS w/ diarrhea & eval 2000 by DrStark> colonoscopy was neg x tortuous colon & rec for incr fiber & Bentyl prn... ~  she has had 2 severe gastroenteritis bouts one in 2007 w/ overnight hosp for IVF & the other in 2011 treated in ER similarly...  Hx of LUMBOSACRAL STRAIN (ICD-846.0) Hx Right KNEE pain >> she is a runner & saw [name] w/ shot in knee & resolved; she uses sleeve brace while running... Hx R-CTS surgery yrs ago and c/o L- carpal tunnel symptoms currently (she has a brace & we will refer to [facility])...    Past Surgical History:  Procedure Laterality Date  . CARPAL TUNNEL RELEASE Right   . CESAREAN SECTION     x 2    Outpatient Encounter Medications as of 10/01/2017  Medication Sig  . ALPRAZolam (XANAX) 0.25 MG tablet Take 1/2 to 1 tablet by mouth three times daily as needed  . ibuprofen (ADVIL,MOTRIN) 200 MG tablet Take 200 mg by mouth every 6 (six) hours as needed.  . Multiple Vitamin (MULTIVITAMIN)  capsule Take 1 capsule by mouth as needed.   . [DISCONTINUED] azithromycin (ZITHROMAX) 250 MG tablet Take as directed (Patient not taking: Reported on 10/01/2017)  . [DISCONTINUED] b complex vitamins tablet Take 1 tablet by mouth as needed.   . [DISCONTINUED] Bee Pollen 1000 MG TABS Take 1 tablet by mouth as needed.   . [DISCONTINUED] Calcium-Vitamin D-Vitamin K (VIACTIV) 500-500-40 MG-UNT-MCG CHEW Chew 1 tablet by mouth as needed.   . [DISCONTINUED] glycopyrrolate (ROBINUL) 2 MG tablet May take 1 tab twice daily for bowel spasms. (Patient not taking: Reported on 10/01/2017)  . [DISCONTINUED] HYDROcodone-acetaminophen (NORCO/VICODIN) 5-325 MG per tablet Take 1 tablet by mouth every 4-6 hours as needed for pain (Patient not taking: Reported on 10/01/2017)   No facility-administered encounter medications on file as of 10/01/2017.     Immunization History  Administered Date(s) Administered  . Influenza Split 05/26/2011, 06/15/2013  . Influenza Whole 06/04/2009  . Influenza,inj,Quad PF,6+ Mos 05/26/2017  . Tdap 08/25/2010    Allergies  Allergen Reactions  . Penicillins     rash    Current Medications, Allergies, Past Medical History, Past Surgical History, Family History, and Social History were reviewed in [employer] record.   Review of Systems    The patient denies fever, chills, sweats, anorexia, fatigue, weakness, malaise, weight loss, sleep disorder, blurring, diplopia, eye irritation, eye discharge, vision loss, eye pain, photophobia, earache, ear discharge, tinnitus, decreased hearing, nasal congestion, nosebleeds, sore throat, hoarseness, chest pain, palpitations,  syncope, dyspnea on exertion, orthopnea, PND, peripheral edema, cough, dyspnea at rest, excessive sputum, hemoptysis, wheezing, pleurisy, nausea, vomiting, diarrhea, constipation, change in bowel habits, abdominal pain, melena, hematochezia, jaundice, gas/bloating, indigestion/heartburn, dysphagia,  odynophagia, dysuria, hematuria, urinary frequency, urinary hesitancy, nocturia, incontinence, back pain, joint pain, joint swelling, muscle cramps, muscle weakness, stiffness, arthritis, sciatica, restless legs, leg pain at night, leg pain with exertion, rash, itching, dryness, suspicious lesions, paralysis, paresthesias, seizures, tremors, vertigo, transient blindness, frequent falls, frequent headaches, difficulty walking, depression, anxiety, memory loss, confusion, cold intolerance, heat intolerance, polydipsia, polyphagia, polyuria, unusual weight change, abnormal bruising, bleeding, enlarged lymph nodes, urticaria, allergic rash, hay fever, and recurrent infections.     Objective:   Physical Exam     WD, WN, 47 y/o WF in NAD... GENERAL:  Alert & oriented; pleasant & cooperative... HEENT:  [place]/AT, EOM-wnl, PERRLA, Fundi-benign, EACs-clear, TMs-wnl, NOSE-clear, THROAT-clear & wnl. NECK:  Supple w/ full ROM; no JVD; normal carotid impulses w/o bruits; no thyromegaly or nodules palpated; no lymphadenopathy. CHEST:  Clear to P & A; without wheezes/ rales/ or rhonchi. HEART:  Regular Rhythm; without murmurs/ rubs/ or gallops. ABDOMEN:  Soft & nontender; normal bowel sounds; no organomegaly or masses detected. EXT: without deformities or arthritic changes; no varicose veins/ venous insuffic/ or edema. NEURO:  CN's intact; motor testing normal; sensory testing normal; gait normal & balance OK. DERM:  No lesions noted; no rash etc...  RADIOLOGY DATA:  Reviewed in the EPIC EMR & discussed w/ the patient...    <<CXR 2/19>> normal heart size, clear lungs, NAD...    <<EKG 11/14 showed NSR, rate63wnl, NAD...  LABORATORY DATA:  Reviewed in the EPIC EMR & discussed w/ the patient...   Assessment:    AR>  Min symptoms in the spring treated w/ OTC meds as needed...  CHOL>  She has mild hypercholesterolemia w/ LDLs~120 range for yrs; Mother had incr Chol & MI, stroke, but was a smoker as well; she  does not want med rx so we reviewed diet etc..  IFG>  There is a pos hx of DM in mother & sister; pt is rec to follow low carb diet & avoid high glycemic index carbs,,,,  Hx dyspepsia>  Remote hx & doing satis; she knows to use Zantac/ Pepcid vs PPI if symptoms recur...  IBS>  Stable & essentially asymptomatic now on fiber, Metamucil, etc...    03/2014>  Right flank area pain of unknown etiology>  See above eval, nothing found & we will continue observation, watchful waiting, prn Vicodin for pain...   Ortho>  She is a runner & saw [name] for shot in her knee & improved; also has sm ?ganglion cyst on right inder finger- she will hotsoak it & f/u w/ drGramig if not resolved...    Hx right carapl tunnel release in past & new onset of left carpal tunnel symptoms 2019=> refer to hand center     Plan:     Patient's Medications  New Prescriptions   No medications on file  Previous Medications   ALPRAZOLAM (XANAX) 0.25 MG TABLET    Take 1/2 to 1 tablet by mouth three times daily as needed   IBUPROFEN (ADVIL,MOTRIN) 200 MG TABLET    Take 200 mg by mouth every 6 (six) hours as needed.   MULTIPLE VITAMIN (MULTIVITAMIN) CAPSULE    Take 1 capsule by mouth as needed.   Modified Medications   No medications on file  Discontinued Medications   AZITHROMYCIN (ZITHROMAX) 250 MG TABLET  Take as directed   B COMPLEX VITAMINS TABLET    Take 1 tablet by mouth as needed.    BEE POLLEN 1000 MG TABS    Take 1 tablet by mouth as needed.    CALCIUM-VITAMIN D-VITAMIN K (VIACTIV) 500-500-40 MG-UNT-MCG CHEW    Chew 1 tablet by mouth as needed.    GLYCOPYRROLATE (ROBINUL) 2 MG TABLET    May take 1 tab twice daily for bowel spasms.   HYDROCODONE-ACETAMINOPHEN (NORCO/VICODIN) 5-325 MG PER TABLET    Take 1 tablet by mouth every 4-6 hours as needed for pain

## 2017-10-01 NOTE — Patient Instructions (Signed)
Vianna, it was great seeing you again!!!  Today we checked a follow up CXR 7 FASTING blood work...    We will contact you w/ the results when available...   Keep up the great job w/ diet & exercise...  We will arrange for a referral to the [facility] for your carpal tunnel symptoms...  Call for any questions or if we can be of service in any way.Marland KitchenMarland Kitchen

## 2017-10-02 ENCOUNTER — Other Ambulatory Visit: Payer: Self-pay | Admitting: Pulmonary Disease

## 2017-10-02 ENCOUNTER — Other Ambulatory Visit (INDEPENDENT_AMBULATORY_CARE_PROVIDER_SITE_OTHER): Payer: No Typology Code available for payment source

## 2017-10-02 DIAGNOSIS — Z0000 Encounter for general adult medical examination without abnormal findings: Secondary | ICD-10-CM | POA: Diagnosis not present

## 2017-10-02 LAB — LIPID PANEL
Cholesterol: 192 mg/dL (ref 0–200)
HDL: 65 mg/dL (ref >39.00)
LDL Cholesterol: 118 mg/dL — ABNORMAL HIGH (ref 0–99)
NonHDL: 127.42
Total CHOL/HDL Ratio: 3
Triglycerides: 45 mg/dL (ref 0.0–149.0)
VLDL: 9 mg/dL (ref 0.0–40.0)

## 2017-10-19 ENCOUNTER — Encounter: Payer: Self-pay | Admitting: Pulmonary Disease

## 2017-10-19 ENCOUNTER — Ambulatory Visit (INDEPENDENT_AMBULATORY_CARE_PROVIDER_SITE_OTHER): Payer: No Typology Code available for payment source | Admitting: Pulmonary Disease

## 2017-10-19 VITALS — BP 110/70 | HR 64 | Temp 98.5°F | Ht 59.0 in | Wt 117.8 lb

## 2017-10-19 DIAGNOSIS — R21 Rash and other nonspecific skin eruption: Secondary | ICD-10-CM

## 2017-10-19 MED ORDER — AZITHROMYCIN 250 MG PO TABS: ORAL_TABLET | ORAL | 0 refills | Status: DC

## 2017-10-19 MED ORDER — HYDROXYZINE HCL 10 MG PO TABS: 10.0000 mg | ORAL_TABLET | ORAL | 0 refills | Status: AC | PRN

## 2017-10-19 MED ORDER — ALPRAZOLAM 0.25 MG PO TABS: ORAL_TABLET | ORAL | 2 refills | Status: DC

## 2017-10-19 NOTE — Patient Instructions (Signed)
Today we updated your med list in our EPIC system...    Continue your current medications the same...  We decided to treat you w/ a ZPak & wrote for some ATARAX (Hydroxyzine) 10mg  for the itching--    Take one tab every 4h as needed...  Try cool compresses too if needed...  Please call me later this week to let me know how you are doing. [name]

## 2017-10-19 NOTE — Progress Notes (Signed)
Subjective:     Patient ID: Natalie Ford, female   DOB: 1971/02/11, 47 y.o.   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 I saw Natalie Ford 2/07- good general health> hx AR, IBS, anxiety on no regular meds... she saw TP 2010- dong well, labs all normal x LDL= 120 on diet Rx alone... she has been running 2-68mi/d 4-5d per wk & weight down 5# to 122# (59" tall & BMI ~25)... no new complaints or concerns; she is working on her BSN & will be completing her classes at [employer]...  ~  March 04, 2012:  54mo ROV & CPX> Natalie Ford is working weekend option at [employer] in the [place] section; also works part time for [employer], [employer]; Natalie Ford is going into 9th grade & Natalie Ford into 4th; she & Natalie Ford have been separated again ~66yr;  Medically she is doing quite well- just a few Ford issues> she is a runner & has actually lost ~9# down to 113# & we discussed not going any lower, add in a nutritional supplement, etc; she saw Natalie Ford for shot in left knee & has been doing fine since then w/ sleeve brace while working out; she has noted a sm knot on right index finger- ?ganglion cyst & we discussed warm soaks & observ for now- refer to DrGramig if persists or gets larger;  She also notes spider veins on legs- not severe & we discussed VVS eval if she desires...    We reviewed prob list, meds (no regular meds, just Vits etc), xrays and labs> see below for updates>>  LABS 7/13:  FLP- ok x LDL=121;  Chems- wnl;  CBC- wnl;  TSH=1.66;  UA- clear   ~  June 29, 2013:  Yearly ROV & CPX> Natalie Ford continues to do well medically- no new complaints or concerns today; Natalie Ford is now 40, in 11th grade & attending [employer], plays volleyball & wants to be an [employer] agent; Natalie Ford is 10, in the 5th grade and plays baseball; Natalie Ford is finishing her schooling at [employer] for her BSN.... Previously she was feeling tired but not resting well etc; now she  is feeling better- active & on the go, rests well, energy good etc...  Weight is down 4# further to 109#, she is on no regular meds just vits and Tylenol...     We reviewed prob list, meds, xrays and labs> see below for updates >> she had the 2014 FLU vaccine in Oct...   LABS 10/14:  FLP- at goals on diet alone;  Chems- wnl, BS=107;  CBC-wnl;  TSH=0.93...   EKG 11/14 showed NSR, rate63wnl, NAD.Marland KitchenMarland Kitchen   ~  March 29, 2014:  18mo ROV & add-on appt requested for right flank area pain> started about 3wks ago- dull, mod severe, comes & goes w/ exac to a 7-8 scale & may last for hours; feels better to move around 7 has actually been jogging w/ the discomfort (pain not made worse etc); notes some nausea, no vomiting, no abd pain or ch in BMs etc; states hx kidney stone as teen (passed it), then cut back on milk & no known recurrence- states urine looks ok, no burning, no blood, no other symptoms...   She went to employee health at [facility] & they did LABS= Chems- wnl, CBC- wnl, UA- clear;  Exam is neg- Abd soft & non-tender, no masses, no skin lesions seen etc...  We  decided to check CT Abd/Pelvis w/ kid stone protocol at [facility]=> done 03/29/14 & they called report: NEG w/o reason for right sided pain apparent (sm nonobstructing left renal calculi seen, none on right, no hydroureter, neg appendix, sm amt of fuid noted in cul-de-sac);  We suggested OV eval by Gyn & she saw [name] (he called to report neg Gyn exam, noting that the fluid in the cul-de-sac is an insignif/ normal finding)...   She is rec to rest, & we will observe on watchful waiting protocol- take VICODIN as needed for pain, observe for any rash, call for any changes or questions...    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  Natalie Ford continues to exercise regularly & doing well overall- her only c/o left carpal tunnel symptoms, she has a brace & has been using it but recall that she prev had right CTS surg yrs ago- [facility] at her request... She had a scare last yr w/ ?abn on mammogram but subseq additional views & tomo was NEG...     Exam shows Afeb, VSS, O2sat=96% on RA;  HEENT- neg, mallampati1;  Chest- clear w/o w/r/r;  Heart- RR w/o m/r/g;  Abd- soft, nontender, neg;  Ext- neg w/o c/c/e;  Neuro- intact...     We reviewed prob list, meds (no regular meds, just Vits etc), xrays and labs> see below for updates>>  CXR 10/01/17 (independently reviewed by me in the PACS system) showed norm heart size, clear lungs- NAD...  LABS 10/01/17>  FLP- ok x LDL=118;  Chems- wnl x FBS=118;  CBC- wnl;  TSH=1.43 IMP/PLAN>>  SEE PROB LIST below-- good general medical health   ~  October 19, 2017:  Add-on appt requested for rash>  Natalie Ford states that she went to tanning bed 3d ago (it felt sl different than prev) & next day noted fine red rash on abd/ sides/ back, sl pruritic, assoc w/ sl sore throat, no adenopathy, then some aching, low grade fever to 99.5.Marland KitchenMarland Kitchen Notes that Natalie Ford had flu about 2 wks ago, and the other had a strep throat;  Also notes that Natalie Ford cancelled an appt due to being diagnosed w/ RMSF;  She denies tick exposures, she's had all her required childhood vaccinations, no new meds, no conjunctivitis, no cough or sput...     Exam shows Afeb, VSS, O2sat=96% on RA;  HEENT- neg w/o exud, mallampati1;  Chest- clear w/o w/r/r;  Heart- RR w/o m/r/g;  Abd- soft, nontender, neg;  Ext- neg w/o c/c/e;  Skin- RASH on trunk- abd/ sides/ back- fine maculopap red bumps w/o vesicles/ pustules/ adenopathy/ etc.  IMP/PLAN>>   Rash ?etiology- statistically most likely to be viral but mult confounding items= tanning bed, children w/ recent Flu & Strep, ?Ford Natalie Ford w/ RMSF; she is not acutely ill but noted low grade temp, some aching, etc;  We discussed most appropriate to treat w/ ZPak (she is allergic to  PCN) & Atarax10 prn itching, & observation- she will call w/ update report on how she is doing.          Problem List:    HEALTH MAINTENANCE EXAM (ICD-V70.0) ~  GI:  she had a colonoscopy 2000 by DrStark because of diarrhea prob at that time... the colon was neg (sl tortuous) & rec for hi fiber diet; she will be due for routine screening colon at age 5... ~  GYN:  DrHenley for PAP, etc.. She is due for f/u mammograms w/ her last exam 04/2016 (in [facility]) w/ ?abnormality seen=> f/u views & tomo showed superimposed shadows and no lesion detected... ~  Immunizations:  she is up to date w/ Flu shots yearly thru [facility] in past & now at work & had TDaP 2012;  She was treated for a Shingles outbreak by DrCottle last yr=> will get the Shingrix when she turns 50...  ALLERGIC RHINITIS (ICD-477.9) - she takes OTC antihist Prn... ~  Last CXR here was 9/11> normal heart size, clear lungs, NAD;  Follow up film 02/2018 is similarly WNL... ~  EKG 11/14 showed NSR, rate63- wnl, NAD...  HYPERCHOLESTEROLEMIA, BORDERLINE (ICD-272.4) - prev on Lipitor Rx, she prefers to control w/ diet alone... ~  FLP 1/07 showed TChol 244, TG 63, HDL 73, LDL 156... rec> Lip10 but Natalie Ford stopped on her own. ~  FLP 11/08 showed TChol 204, TG 72, HDL 71, LDL 123 ~  FLP 11/10 showed TChol 192, TG 59, HDL 60, LDL 120 ~  FLP 9/11 showed TChol 196, TG 56, HDL 64, LDL 121 ~  FLP 1/12 showed TChol 185, TG 46, HDL 65, LDL 111 ~  FLP 7/13 showed TChol 196, TG 56, HDL 64, LDL 121 ~  FLP 10/14 showed TChol 184, TG 67, HDL 67, LDL 104 ~  FLP 2/19 showed TChol 192, TG 45, HDL 65, LDL 118 => stable & we reviewed low chol diet & exercise...  IMPAIRED FASTING GLUCOSE - there  is a +FamHx of DM (mother had diet controlled DM, & sister has DM2 on meds/ no insulin) ~  Labs 2/19 showed FBS=118 and we discussed low carb diet & avoiding foods w/ hi glycemic index...  Hx of DYSPEPSIA (ICD-536.8) - she uses OTC H2 blockers Prn...  IRRITABLE BOWEL SYNDROME (ICD-564.1) - hx IBS w/ diarrhea & eval 2000 by DrStark> colonoscopy was neg x tortuous colon & rec for incr fiber & Bentyl prn... ~  she has had 2 severe gastroenteritis bouts Natalie in 2007 w/ overnight hosp for IVF & the other in 2011 treated in ER similarly...  Hx of LUMBOSACRAL STRAIN (ICD-846.0) Hx Right KNEE pain >> she is a runner & saw Natalie Ford w/ shot in knee & resolved; she uses sleeve brace while running... Hx R-CTS surgery yrs ago and c/o L- carpal tunnel symptoms currently (she has a brace & we will [facility])...    Past Surgical History:  Procedure Laterality Date  . CARPAL TUNNEL RELEASE Right   . CESAREAN SECTION     x 2    Outpatient Encounter Medications as of 10/19/2017  Medication Sig  . ALPRAZolam (XANAX) 0.25 MG tablet Take 1/2 to 1 tablet by mouth three times daily as needed  . ibuprofen (ADVIL,MOTRIN) 200 MG tablet Take 200 mg by mouth every 6 (six) hours as needed.  . Multiple Vitamin (MULTIVITAMIN) capsule Take 1 capsule by mouth as needed.   . [DISCONTINUED] ALPRAZolam (XANAX) 0.25 MG tablet Take  1/2 to 1 tablet by mouth three times daily as needed  . azithromycin (ZITHROMAX) 250 MG tablet Take as directed  . hydrOXYzine (ATARAX/VISTARIL) 10 MG tablet Take 1 tablet (10 mg total) by mouth every 4 (four) hours as needed.   No facility-administered encounter medications on file as of 10/19/2017.     Immunization History  Administered Date(s) Administered  . Influenza Split 05/26/2011, 06/15/2013  . Influenza Whole 06/04/2009  . Influenza,inj,Quad PF,6+ Mos 05/26/2017  . Tdap 08/25/2010    Allergies  Allergen Reactions  . Penicillins     rash    Current Medications,  Allergies, Past Medical History, Past Surgical History, Family History, and Social History were reviewed in [employer] record.   Review of Systems    The patient denies fever, chills, sweats, anorexia, fatigue, weakness, malaise, weight loss, sleep disorder, blurring, diplopia, eye irritation, eye discharge, vision loss, eye pain, photophobia, earache, ear discharge, tinnitus, decreased hearing, nasal congestion, nosebleeds, sore throat, hoarseness, chest pain, palpitations, syncope, dyspnea on exertion, orthopnea, PND, peripheral edema, cough, dyspnea at rest, excessive sputum, hemoptysis, wheezing, pleurisy, nausea, vomiting, diarrhea, constipation, change in bowel habits, abdominal pain, melena, hematochezia, jaundice, gas/bloating, indigestion/heartburn, dysphagia, odynophagia, dysuria, hematuria, urinary frequency, urinary hesitancy, nocturia, incontinence, back pain, joint pain, joint swelling, muscle cramps, muscle weakness, stiffness, arthritis, sciatica, restless legs, leg pain at night, leg pain with exertion, rash, itching, dryness, suspicious lesions, paralysis, paresthesias, seizures, tremors, vertigo, transient blindness, frequent falls, frequent headaches, difficulty walking, depression, anxiety, memory loss, confusion, cold intolerance, heat intolerance, polydipsia, polyphagia, polyuria, unusual weight change, abnormal bruising, bleeding, enlarged lymph nodes, urticaria, allergic rash, hay fever, and recurrent infections.     Objective:   Physical Exam     WD, WN, 47 y/o WF in NAD... GENERAL:  Alert & oriented; pleasant & cooperative... HEENT:  [place]/AT, EOM-wnl, PERRLA, Fundi-benign, EACs-clear, TMs-wnl, NOSE-clear, THROAT-clear & wnl. NECK:  Supple w/ full ROM; no JVD; normal carotid impulses w/o bruits; no thyromegaly or nodules palpated; no lymphadenopathy. CHEST:  Clear to P & A; without wheezes/ rales/ or rhonchi. HEART:  Regular Rhythm; without murmurs/  rubs/ or gallops. ABDOMEN:  Soft & nontender; normal bowel sounds; no organomegaly or masses detected. EXT: without deformities or arthritic changes; no varicose veins/ venous insuffic/ or edema. NEURO:  CN's intact; motor testing normal; sensory testing normal; gait normal & balance OK. DERM:  Fine red papular rash on abd/ sides/ back, sl puritic, no vesicles, pustules etc...  RADIOLOGY DATA:  Reviewed in the EPIC EMR & discussed w/ the patient...    <<CXR 2/19>> normal heart size, clear lungs, NAD...    <<EKG 11/14 showed NSR, rate63wnl, NAD...  LABORATORY DATA:  Reviewed in the EPIC EMR & discussed w/ the patient...   Assessment:    RASH>  ?etiology- we discussed treatment w/ ZPak, Atarax10mg , & observation...   AR>  Min symptoms in the spring treated w/ OTC meds as needed...  CHOL>  She has mild hypercholesterolemia w/ LDLs~120 range for yrs; Mother had incr Chol & MI, stroke, but was a smoker as well; she does not want med rx so we reviewed diet etc..  IFG>  There is a pos hx of DM in mother & sister; Natalie Ford is rec to follow low carb diet & avoid high glycemic index carbs,,,,  Hx dyspepsia>  Remote hx & doing satis; she knows to use Zantac/ Pepcid vs PPI if symptoms recur...  IBS>  Stable & essentially asymptomatic now on fiber, Metamucil, etc..[name]  03/2014>  Right flank area pain of unknown etiology>  See above eval, nothing found & we will continue observation, watchful waiting, prn Vicodin for pain...   Ford>  She is a runner & saw Natalie Ford for shot in her knee & improved; also has sm ?ganglion cyst on right inder finger- she will hotsoak it & f/u w/ drGramig if not resolved...    Hx right carapl tunnel release in past & new onset of left carpal tunnel symptoms 2019=> refer to [facility]     Plan:     Patient's Medications  New Prescriptions   AZITHROMYCIN (ZITHROMAX) 250 MG TABLET    Take as directed   HYDROXYZINE (ATARAX/VISTARIL) 10 MG TABLET    Take 1 tablet (10 mg  total) by mouth every 4 (four) hours as needed.  Previous Medications   IBUPROFEN (ADVIL,MOTRIN) 200 MG TABLET    Take 200 mg by mouth every 6 (six) hours as needed.   MULTIPLE VITAMIN (MULTIVITAMIN) CAPSULE    Take 1 capsule by mouth as needed.   Modified Medications   Modified Medication Previous Medication   ALPRAZOLAM (XANAX) 0.25 MG TABLET ALPRAZolam (XANAX) 0.25 MG tablet      Take 1/2 to 1 tablet by mouth three times daily as needed    Take 1/2 to 1 tablet by mouth three times daily as needed  Discontinued Medications   No medications on file

## 2017-10-22 ENCOUNTER — Other Ambulatory Visit: Payer: Self-pay | Admitting: Obstetrics and Gynecology

## 2017-10-22 DIAGNOSIS — N6489 Other specified disorders of breast: Secondary | ICD-10-CM

## 2017-10-28 ENCOUNTER — Ambulatory Visit: Payer: No Typology Code available for payment source

## 2017-10-28 ENCOUNTER — Ambulatory Visit
Admission: RE | Admit: 2017-10-28 | Discharge: 2017-10-28 | Disposition: A | Discharge status: Home / Self Care | Payer: No Typology Code available for payment source | Source: Ambulatory Visit | Attending: Obstetrics and Gynecology | Admitting: Obstetrics and Gynecology

## 2017-10-28 DIAGNOSIS — N6489 Other specified disorders of breast: Secondary | ICD-10-CM

## 2017-11-18 ENCOUNTER — Encounter: Payer: Self-pay | Admitting: Pulmonary Disease

## 2017-11-18 ENCOUNTER — Ambulatory Visit: Payer: No Typology Code available for payment source | Admitting: Pulmonary Disease

## 2017-11-18 ENCOUNTER — Ambulatory Visit (HOSPITAL_COMMUNITY)
Admission: RE | Admit: 2017-11-18 | Discharge: 2017-11-18 | Disposition: A | Discharge status: Home / Self Care | Payer: No Typology Code available for payment source | Source: Ambulatory Visit | Attending: Vascular Surgery | Admitting: Vascular Surgery

## 2017-11-18 DIAGNOSIS — M79605 Pain in left leg: Secondary | ICD-10-CM

## 2017-11-18 MED ORDER — METHOCARBAMOL 500 MG PO TABS: 500.0000 mg | ORAL_TABLET | Freq: Four times a day (QID) | ORAL | 0 refills | Status: DC

## 2017-11-18 NOTE — Progress Notes (Signed)
Subjective:     Patient ID: Natalie Ford, female   DOB: 1971/02/11, 47 y.o.   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 I saw [name] 2/07- good general health> hx AR, IBS, anxiety on no regular meds... she saw TP 2010- dong well, labs all normal x LDL= 120 on diet Rx alone... she has been running 2-68mi/d 4-5d per wk & weight down 5# to 122# (59" tall & BMI ~25)... no new complaints or concerns; she is working on her BSN & will be completing her classes at [employer]...  ~  March 04, 2012:  54mo ROV & CPX> [name] is working weekend option at [employer] in the [place] section; also works part time for [employer], [employer]; [name] is going into 9th grade & [name] into 4th; she & Judie Grieve have been separated again ~66yr;  Medically she is doing quite well- just a few ortho issues> she is a runner & has actually lost ~9# down to 113# & we discussed not going any lower, add in a nutritional supplement, etc; she saw [name] for shot in left knee & has been doing fine since then w/ sleeve brace while working out; she has noted a sm knot on right index finger- ?ganglion cyst & we discussed warm soaks & observ for now- refer to DrGramig if persists or gets larger;  She also notes spider veins on legs- not severe & we discussed VVS eval if she desires...    We reviewed prob list, meds (no regular meds, just Vits etc), xrays and labs> see below for updates>>  LABS 7/13:  FLP- ok x LDL=121;  Chems- wnl;  CBC- wnl;  TSH=1.66;  UA- clear   ~  June 29, 2013:  Yearly ROV & CPX> [name] continues to do well medically- no new complaints or concerns today; [name] is now 40, in 11th grade & attending [employer], plays volleyball & wants to be an [employer] agent; [name] is 10, in the 5th grade and plays baseball; [name] is finishing her schooling at [employer] for her BSN.... Previously she was feeling tired but not resting well etc; now she  is feeling better- active & on the go, rests well, energy good etc...  Weight is down 4# further to 109#, she is on no regular meds just vits and Tylenol...     We reviewed prob list, meds, xrays and labs> see below for updates >> she had the 2014 FLU vaccine in Oct...   LABS 10/14:  FLP- at goals on diet alone;  Chems- wnl, BS=107;  CBC-wnl;  TSH=0.93...   EKG 11/14 showed NSR, rate63wnl, NAD.Marland KitchenMarland Kitchen   ~  March 29, 2014:  18mo ROV & add-on appt requested for right flank area pain> started about 3wks ago- dull, mod severe, comes & goes w/ exac to a 7-8 scale & may last for hours; feels better to move around 7 has actually been jogging w/ the discomfort (pain not made worse etc); notes some nausea, no vomiting, no abd pain or ch in BMs etc; states hx kidney stone as teen (passed it), then cut back on milk & no known recurrence- states urine looks ok, no burning, no blood, no other symptoms...   She went to employee health at [facility] & they did LABS= Chems- wnl, CBC- wnl, UA- clear;  Exam is neg- Abd soft & non-tender, no masses, no skin lesions seen etc...  We  decided to check CT Abd/Pelvis w/ kid stone protocol at [facility]=> done 03/29/14 & they called report: NEG w/o reason for right sided pain apparent (sm nonobstructing left renal calculi seen, none on right, no hydroureter, neg appendix, sm amt of fuid noted in cul-de-sac);  We suggested OV eval by Gyn & she saw [name] (he called to report neg Gyn exam, noting that the fluid in the cul-de-sac is an insignif/ normal finding)...   She is rec to rest, & we will observe on watchful waiting protocol- take VICODIN as needed for pain, observe for any rash, call for any changes or questions...    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  Mayan continues to exercise regularly & doing well overall- her only c/o left carpal tunnel symptoms, she has a brace & has been using it but recall that she prev had right CTS surg yrs ago- we will [facility] at her request... She had a scare last yr w/ ?abn on mammogram but subseq additional views & tomo was NEG...     Exam shows Afeb, VSS, O2sat=96% on RA;  HEENT- neg, mallampati1;  Chest- clear w/o w/r/r;  Heart- RR w/o m/r/g;  Abd- soft, nontender, neg;  Ext- neg w/o c/c/e;  Neuro- intact...     We reviewed prob list, meds (no regular meds, just Vits etc), xrays and labs> see below for updates>>  CXR 10/01/17 (independently reviewed by me in the PACS system) showed norm heart size, clear lungs- NAD...  LABS 10/01/17>  FLP- ok x LDL=118;  Chems- wnl x FBS=118;  CBC- wnl;  TSH=1.43 IMP/PLAN>>  SEE PROB LIST below-- good general medical health   ~  October 19, 2017:  Add-on appt requested for rash>  Pt states that she went to tanning bed 3d ago (it felt sl different than prev) & next day noted fine red rash on abd/ sides/ back, sl pruritic, assoc w/ sl sore throat, no adenopathy, then some aching, low grade fever to 99.5.Marland KitchenMarland Kitchen Notes that one child had flu about 2 wks ago, and the other had a strep throat;  Also notes that one pt in their clinic cancelled an appt due to being diagnosed w/ RMSF;  She denies tick exposures, she's had all her required childhood vaccinations, no new meds, no conjunctivitis, no cough or sput...     Exam shows Afeb, VSS, O2sat=96% on RA;  HEENT- neg w/o exud, mallampati1;  Chest- clear w/o w/r/r;  Heart- RR w/o m/r/g;  Abd- soft, nontender, neg;  Ext- neg w/o c/c/e;  Skin- RASH on trunk- abd/ sides/ back- fine maculopap red bumps w/o vesicles/ pustules/ adenopathy/ etc.  IMP/PLAN>>   Rash ?etiology- statistically most likely to be viral but mult confounding items= tanning bed, children w/ recent Flu & Strep, ?clinic pt w/ RMSF; she is not acutely ill but noted low grade temp, some aching, etc;  We discussed most appropriate to treat w/ ZPak (she is allergic to  PCN) & Atarax10 prn itching, & observation- she will call w/ update report on how she is doing.   ~  November 18, 2017:  54mo ROV & add-on appt for acute left leg discomfort>    Today we updated your med list in our EPIC system...    Continue your current medications the same...  We will sched VENOUS DOPPLER TEST of your left leg ASAP...    We will contact you w/ the results when available...   In the meanwhile>>    Apply heat to the area...    Wear support hose when up & about...    Try the ROBAXIN (generic) 500mg  one tab up to 4 times daily as needed for the pain...  We reviewed some gentle stretching exercise for your achilles area...  Call for any questions or if we can be ogf service in any way...          Problem List:    HEALTH MAINTENANCE EXAM (ICD-V70.0) ~  GI:  she had a colonoscopy 2000 by DrStark because of diarrhea prob at that time... the colon was neg (sl tortuous) & rec for hi fiber diet; she will be due for routine screening colon at age 64... ~  GYN:  DrHenley for PAP, etc.. She is due for f/u mammograms w/ her last exam 04/2016 (in [place]) w/ ?abnormality seen=> f/u views & tomo showed superimposed shadows and no lesion detected... ~  Immunizations:  she is up to date w/ Flu shots yearly thru [facility] in past & now at work & had TDaP 2012;  She was treated for a Shingles outbreak by DrCottle last yr=> will get the Shingrix when she turns 50...  ALLERGIC RHINITIS (ICD-477.9) - she takes OTC antihist Prn... ~  Last CXR here was 9/11> normal heart size, clear lungs, NAD;  Follow up film 02/2018 is similarly WNL... ~  EKG 11/14 showed NSR, rate63- wnl, NAD...  HYPERCHOLESTEROLEMIA, BORDERLINE  (ICD-272.4) - prev on Lipitor Rx, she prefers to control w/ diet alone... ~  FLP 1/07 showed TChol 244, TG 63, HDL 73, LDL 156... rec> Lip10 but pt stopped on her own. ~  FLP 11/08 showed TChol 204, TG 72, HDL 71, LDL 123 ~  FLP 11/10 showed TChol 192, TG 59, HDL 60, LDL 120 ~  FLP 9/11 showed TChol 196, TG 56, HDL 64, LDL 121 ~  FLP 1/12 showed TChol 185, TG 46, HDL 65, LDL 111 ~  FLP 7/13 showed TChol 196, TG 56, HDL 64, LDL 121 ~  FLP 10/14 showed TChol 184, TG 67, HDL 67, LDL 104 ~  FLP 2/19 showed TChol 192, TG 45, HDL 65, LDL 118 => stable & we reviewed low chol diet & exercise...  IMPAIRED FASTING GLUCOSE - there is a +FamHx of DM (mother had diet controlled DM, & sister has DM2 on meds/ no insulin) ~  Labs 2/19 showed FBS=118 and we discussed low carb diet & avoiding foods w/ hi glycemic index...  Hx of DYSPEPSIA (ICD-536.8) - she uses OTC H2 blockers Prn...  IRRITABLE BOWEL SYNDROME (ICD-564.1) - hx IBS w/ diarrhea & eval 2000 by DrStark> colonoscopy was neg x tortuous colon & rec for incr fiber & Bentyl prn... ~  she has had 2 severe gastroenteritis bouts one in 2007 w/ overnight hosp for IVF & the other in 2011 treated in ER similarly...  Hx of LUMBOSACRAL STRAIN (ICD-846.0) Hx Right KNEE pain >> she is a runner & saw Mayford Knife w/  shot in knee & resolved; she uses sleeve brace while running... Hx R-CTS surgery yrs ago and c/o L- carpal tunnel symptoms currently (she has a brace & we will refer to [facility])...   Past Surgical History:  Procedure Laterality Date  . CARPAL TUNNEL RELEASE Right   . CESAREAN SECTION     x 2    Outpatient Encounter Medications as of 11/18/2017  Medication Sig  . ALPRAZolam (XANAX) 0.25 MG tablet Take 1/2 to 1 tablet by mouth three times daily as needed  . hydrOXYzine (ATARAX/VISTARIL) 10 MG tablet Take 1 tablet (10 mg total) by mouth every 4 (four) hours as needed.  . Multiple Vitamin (MULTIVITAMIN) capsule Take 1 capsule by mouth as  needed.   . [DISCONTINUED] azithromycin (ZITHROMAX) 250 MG tablet Take as directed  . ibuprofen (ADVIL,MOTRIN) 200 MG tablet Take 200 mg by mouth every 6 (six) hours as needed.  . methocarbamol (ROBAXIN) 500 MG tablet Take 1 tablet (500 mg total) by mouth 4 (four) times daily.   No facility-administered encounter medications on file as of 11/18/2017.     Immunization History  Administered Date(s) Administered  . Influenza Split 05/26/2011, 06/15/2013  . Influenza Whole 06/04/2009  . Influenza,inj,Quad PF,6+ Mos 05/26/2017  . Tdap 08/25/2010    Allergies  Allergen Reactions  . Penicillins     rash    Current Medications, Allergies, Past Medical History, Past Surgical History, Family History, and Social History were reviewed in [employer] record.   Review of Systems    The patient denies fever, chills, sweats, anorexia, fatigue, weakness, malaise, weight loss, sleep disorder, blurring, diplopia, eye irritation, eye discharge, vision loss, eye pain, photophobia, earache, ear discharge, tinnitus, decreased hearing, nasal congestion, nosebleeds, sore throat, hoarseness, chest pain, palpitations, syncope, dyspnea on exertion, orthopnea, PND, peripheral edema, cough, dyspnea at rest, excessive sputum, hemoptysis, wheezing, pleurisy, nausea, vomiting, diarrhea, constipation, change in bowel habits, abdominal pain, melena, hematochezia, jaundice, gas/bloating, indigestion/heartburn, dysphagia, odynophagia, dysuria, hematuria, urinary frequency, urinary hesitancy, nocturia, incontinence, back pain, joint pain, joint swelling, muscle cramps, muscle weakness, stiffness, arthritis, sciatica, restless legs, leg pain at night, leg pain with exertion, rash, itching, dryness, suspicious lesions, paralysis, paresthesias, seizures, tremors, vertigo, transient blindness, frequent falls, frequent headaches, difficulty walking, depression, anxiety, memory loss, confusion, cold  intolerance, heat intolerance, polydipsia, polyphagia, polyuria, unusual weight change, abnormal bruising, bleeding, enlarged lymph nodes, urticaria, allergic rash, hay fever, and recurrent infections.     Objective:   Physical Exam     WD, WN, 47 y/o WF in NAD... GENERAL:  Alert & oriented; pleasant & cooperative... HEENT:  [place]/AT, EOM-wnl, PERRLA, Fundi-benign, EACs-clear, TMs-wnl, NOSE-clear, THROAT-clear & wnl. NECK:  Supple w/ full ROM; no JVD; normal carotid impulses w/o bruits; no thyromegaly or nodules palpated; no lymphadenopathy. CHEST:  Clear to P & A; without wheezes/ rales/ or rhonchi. HEART:  Regular Rhythm; without murmurs/ rubs/ or gallops. ABDOMEN:  Soft & nontender; normal bowel sounds; no organomegaly or masses detected. EXT: without deformities or arthritic changes; no varicose veins/ venous insuffic/ or edema. NEURO:  CN's intact; motor testing normal; sensory testing normal; gait normal & balance OK. DERM:  Fine red papular rash on abd/ sides/ back, sl puritic, no vesicles, pustules etc...  RADIOLOGY DATA:  Reviewed in the EPIC EMR & discussed w/ the patient...    <<CXR 2/19>> normal heart size, clear lungs, NAD...    <<EKG 11/14 showed NSR, rate63wnl, NAD...  LABORATORY DATA:  Reviewed in the EPIC EMR &  discussed w/ the patient...   Assessment:    RASH>  ?etiology- we discussed treatment w/ ZPak, Atarax10mg , & observation...   AR>  Min symptoms in the spring treated w/ OTC meds as needed...  CHOL>  She has mild hypercholesterolemia w/ LDLs~120 range for yrs; Mother had incr Chol & MI, stroke, but was a smoker as well; she does not want med rx so we reviewed diet etc..  IFG>  There is a pos hx of DM in mother & sister; pt is rec to follow low carb diet & avoid high glycemic index carbs,,,,  Hx dyspepsia>  Remote hx & doing satis; she knows to use Zantac/ Pepcid vs PPI if symptoms recur...  IBS>  Stable & essentially asymptomatic now on fiber, Metamucil,  etc...    03/2014>  Right flank area pain of unknown etiology>  See above eval, nothing found & we will continue observation, watchful waiting, prn Vicodin for pain...   Ortho>  She is a runner & saw [name] for shot in her knee & improved; also has sm ?ganglion cyst on right inder finger- she will hotsoak it & f/u w/ drGramig if not resolved...    Hx right carapl tunnel release in past & new onset of left carpal tunnel symptoms 2019=> refer to hand center     Plan:     Patient's Medications  New Prescriptions   METHOCARBAMOL (ROBAXIN) 500 MG TABLET    Take 1 tablet (500 mg total) by mouth 4 (four) times daily.  Previous Medications   ALPRAZOLAM (XANAX) 0.25 MG TABLET    Take 1/2 to 1 tablet by mouth three times daily as needed   HYDROXYZINE (ATARAX/VISTARIL) 10 MG TABLET    Take 1 tablet (10 mg total) by mouth every 4 (four) hours as needed.   IBUPROFEN (ADVIL,MOTRIN) 200 MG TABLET    Take 200 mg by mouth every 6 (six) hours as needed.   MULTIPLE VITAMIN (MULTIVITAMIN) CAPSULE    Take 1 capsule by mouth as needed.   Modified Medications   No medications on file  Discontinued Medications   AZITHROMYCIN (ZITHROMAX) 250 MG TABLET    Take as directed

## 2017-11-18 NOTE — Patient Instructions (Signed)
Today we updated your med list in our EPIC system...    Continue your current medications the same...  We will sched VENOUS DOPPLER TEST of your left leg ASAP...    We will contact you w/ the results when available...   In the meanwhile>>    Apply heat to the area...    Wear support hose when up & about...    Try the ROBAXIN (generic) 500mg  one tab up to 4 times daily as needed for the pain...  We reviewed some gentle stretching exercise for your achilles area...  Call for any questions or if we can be ogf service in any way. [name]

## 2018-05-13 ENCOUNTER — Encounter (HOSPITAL_COMMUNITY): Payer: Self-pay | Admitting: Emergency Medicine

## 2018-05-13 ENCOUNTER — Ambulatory Visit: Payer: No Typology Code available for payment source | Admitting: Pulmonary Disease

## 2018-05-13 ENCOUNTER — Ambulatory Visit (HOSPITAL_COMMUNITY)
Admission: EM | Admit: 2018-05-13 | Discharge: 2018-05-13 | Disposition: A | Discharge status: Home / Self Care | Payer: No Typology Code available for payment source | Attending: Family Medicine | Admitting: Family Medicine

## 2018-05-13 DIAGNOSIS — H9201 Otalgia, right ear: Secondary | ICD-10-CM | POA: Diagnosis not present

## 2018-05-13 DIAGNOSIS — S01331A Puncture wound without foreign body of right ear, initial encounter: Secondary | ICD-10-CM

## 2018-05-13 MED ORDER — DOXYCYCLINE HYCLATE 100 MG PO CAPS: 100.0000 mg | ORAL_CAPSULE | Freq: Two times a day (BID) | ORAL | 0 refills | Status: DC

## 2018-05-13 NOTE — Discharge Instructions (Signed)
Piercing's are looking fair here today.  It looks like they are continuing to heal.  I would recommend the saline piercing cleaning spray, twice a day.  Ice application for comfort.  Tylenol and/or ibuprofen as needed for pain or fevers.   As you have been doing, avoid laying on the ear/talking on the phone on this ear etc. Try to avoid touching piercing's.  Rest as able. Drink plenty of fluids. Heart rate and temperature look well here today.  I have provided you with antibiotics you may fill if develop worsening of swelling redness or pain of the piercing's.  If symptoms worsen or do not improve in the next week to return to be seen or to follow up with your PCP.

## 2018-05-13 NOTE — ED Triage Notes (Signed)
Pt here for right ear pain after having piercing; pt sts 2 rounds of antibiotics and still not better; pt sts chills today

## 2018-05-13 NOTE — ED Provider Notes (Signed)
[facility]    CSN: [id number] Arrival date & time: 05/13/18  1800     History   Chief Complaint Chief Complaint  Patient presents with  . Otalgia    HPI Natalie Ford is a 47 y.o. female.   Natalie Ford presents with complaints of right ear pain s/p two piercing's three weeks ago. States the piercings have been red and inflamed, no specific drainage. 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 She completed cipro yesterday and bactrim still taking. Ear still feels warm. Denies any previous complications with piercings, has her lobes pierced. Cleanses with dial soap daily. Has some peeling to skin around one of the piercings. Today she felt achy, with facial flushing and just "not herself."  Has avoided sleeping on her ear or talking on the phone on this side. States felt feverish today. Took ibuprofen then morning. States she is going to a conference out of town tomorrow so is concerned. No gi complaints. Without contributing medical history.      ROS per HPI.      Past Medical History:  Diagnosis Date  . Allergic rhinitis   . Dyspepsia   . Hypercholesteremia   . IBS (irritable bowel syndrome)   . Lumbosacral strain     Patient Active Problem List   Diagnosis Date Noted  . Left leg pain 11/18/2017  . Carpal tunnel syndrome 10/01/2017  . Kidney stones 03/30/2014  . Physical exam, annual 06/29/2013  . Anxiety 06/29/2013  . Borderline hypercholesterolemia 05/14/2010  . DYSPEPSIA 05/14/2010  . LUMBOSACRAL STRAIN 05/14/2010  . Allergic rhinitis 05/15/2009  . UPPER RESPIRATORY INFECTION 05/09/2009  . GASTROENTERITIS 12/25/2007  . IRRITABLE BOWEL SYNDROME 12/25/2007    Past Surgical History:  Procedure Laterality Date  . CARPAL TUNNEL RELEASE Right   . CESAREAN SECTION     x 2    OB History   None      Home Medications    Prior to Admission medications   Medication Sig Start Date End Date  Taking? Authorizing Provider  ALPRAZolam Prudy Feeler) 0.25 MG tablet Take 1/2 to 1 tablet by mouth three times daily as needed 10/19/17   Michele Mcalpine, MD  doxycycline (VIBRAMYCIN) 100 MG capsule Take 1 capsule (100 mg total) by mouth 2 (two) times daily. 05/13/18   Georgetta Haber, NP  ibuprofen (ADVIL,MOTRIN) 200 MG tablet Take 200 mg by mouth every 6 (six) hours as needed.    [provider]  methocarbamol (ROBAXIN) 500 MG tablet Take 1 tablet (500 mg total) by mouth 4 (four) times daily. 11/18/17   Michele Mcalpine, MD  Multiple Vitamin (MULTIVITAMIN) capsule Take 1 capsule by mouth as needed.     [provider]    Family History Family History  Problem Relation Age of Onset  . Diabetes Mother   . Heart disease Mother   . Leukemia Father   . Stomach cancer Paternal Grandfather   . Kidney disease Paternal Aunt     Social History Social History   Tobacco Use  . Smoking status: Never Smoker  . Smokeless tobacco: Never Used  . Tobacco comment: exposed to second hand smoke  Substance Use Topics  . Alcohol use: Yes    Comment: social use  . Drug use: No     Allergies   Penicillins   Review of Systems Review of Systems   Physical Exam Triage Vital Signs ED Triage  Vitals [05/13/18 1821]  Enc Vitals Group     BP 127/78     Pulse Rate 87     Resp 18     Temp 97.9 F (36.6 C)     Temp Source Oral     SpO2 98 %     Weight      Height      Head Circumference      Peak Flow      Pain Score      Pain Loc      Pain Edu?      Excl. in GC?    No data found.  Updated Vital Signs BP 127/78 (BP Location: Left Arm)   Pulse 87   Temp 97.9 F (36.6 C) (Oral)   Resp 18   SpO2 98%    Physical Exam  Constitutional: She is oriented to person, place, and time. She appears well-developed and well-nourished. No distress.  HENT:  Head: Normocephalic and atraumatic.  Right Ear: Tympanic membrane and ear canal normal.  Left Ear: Hearing and external ear  normal.  Ears:  piercings to right ear tragus and external cartilage; mild external ear redness and tenderness; slight flaking of skin around tragus piercing; no drainage from either piercing, no swelling  Cardiovascular: Normal rate, regular rhythm and normal heart sounds.  Pulmonary/Chest: Effort normal and breath sounds normal.  Neurological: She is alert and oriented to person, place, and time.  Skin: Skin is warm and dry.     UC Treatments / Results  Labs (all labs ordered are listed, but only abnormal results are displayed) Labs Reviewed - No data to display  EKG None  Radiology No results found.  Procedures Procedures (including critical care time)  Medications Ordered in UC Medications - No data to display  Initial Impression / Assessment and Plan / UC Course  I have reviewed the triage vital signs and the nursing notes.  Pertinent labs & imaging results that were available during my care of the patient were reviewed by me and considered in my medical decision making (see chart for details).     Two cartilage piercings three weeks out. Minimal swelling. Mild redness present. No drainage or abscess present. Slight flaking of skin around tragus. Suspect this is related to use of dial soap around the area as well as from swelling which has improved. Still on antibiotics. Feeling generally unwell today. Has been on two courses of bactrim and cipro. Vitals stable here today. piercings appear to be healing well at this time. Discussed wound care and management. Discussed that definitive treatment is removal of piercing. Use of saline piercing spray recommended. If symptoms worsen or do not improve in the next week to return to be seen or to follow up with PCP.  Patient verbalized understanding and agreeable to plan.   Final Clinical Impressions(s) / UC Diagnoses   Final diagnoses:  Right ear pain  Complication of right ear piercing, initial encounter     Discharge  Instructions     Piercing's are looking fair here today.  It looks like they are continuing to heal.  I would recommend the saline piercing cleaning spray, twice a day.  Ice application for comfort.  Tylenol and/or ibuprofen as needed for pain or fevers.   As you have been doing, avoid laying on the ear/talking on the phone on this ear etc. Try to avoid touching piercing's.  Rest as able. Drink plenty of fluids. Heart rate and temperature look well here  today.  I have provided you with antibiotics you may fill if develop worsening of swelling redness or pain of the piercing's.  If symptoms worsen or do not improve in the next week to return to be seen or to follow up with your PCP.     ED Prescriptions    Medication Sig Dispense Auth. Provider   doxycycline (VIBRAMYCIN) 100 MG capsule Take 1 capsule (100 mg total) by mouth 2 (two) times daily. 20 capsule Georgetta Haber, NP     Controlled Substance Prescriptions [place] Controlled Substance Registry consulted? Not Applicable   Georgetta Haber, NP 05/13/18 1910

## 2018-06-22 ENCOUNTER — Ambulatory Visit: Payer: No Typology Code available for payment source | Admitting: Primary Care

## 2018-06-22 ENCOUNTER — Encounter: Payer: Self-pay | Admitting: Primary Care

## 2018-06-22 DIAGNOSIS — M7542 Impingement syndrome of left shoulder: Secondary | ICD-10-CM

## 2018-06-22 MED ORDER — PREDNISONE 10 MG PO TABS: ORAL_TABLET | ORAL | 0 refills | Status: DC

## 2018-06-22 NOTE — Progress Notes (Signed)
'@Patient'  ID: Natalie Ford, female    DOB: 23-Feb-1971, 47 y.o.   MRN: [id number]  Chief Complaint  Patient presents with  . Acute Visit    left arm sore, slightly swallon, restricted movement x2 weeks    Referring provider: Noralee Space, MD  HPI: 47 year old female. Patient of Dr. Lenna Gilford, last seen in March 2019.  06/22/2018 Patient presents today for acute visit with complaints of left upper arm pain after receiving influenza vaccine 2 weeks ago. States that a nurse at work gave her the flu shot. Pain started 7 days ago, described as soreness. States she recently has had trouble lifting her arm to dress her self. As of last night, she is having a hard time lifting her arm or closing a car door. No fever or chills.    Allergies  Allergen Reactions  . Penicillins     rash    Immunization History  Administered Date(s) Administered  . Influenza Split 05/26/2011, 06/15/2013  . Influenza Whole 06/04/2009  . Influenza, Quadrivalent, Recombinant, Inj, Pf 06/04/2018  . Influenza,inj,Quad PF,6+ Mos 05/26/2017  . Tdap 08/25/2010    Past Medical History:  Diagnosis Date  . Allergic rhinitis   . Dyspepsia   . Hypercholesteremia   . IBS (irritable bowel syndrome)   . Lumbosacral strain     Tobacco History: Social History   Tobacco Use  Smoking Status Never Smoker  Smokeless Tobacco Never Used  Tobacco Comment   exposed to second hand smoke   Counseling given: Not Answered Comment: exposed to second hand smoke   Outpatient Medications Prior to Visit  Medication Sig Dispense Refill  . ibuprofen (ADVIL,MOTRIN) 200 MG tablet Take 200 mg by mouth every 6 (six) hours as needed.    . Multiple Vitamin (MULTIVITAMIN) capsule Take 1 capsule by mouth as needed.     . ALPRAZolam (XANAX) 0.25 MG tablet Take 1/2 to 1 tablet by mouth three times daily as needed 30 tablet 2  . doxycycline (VIBRAMYCIN) 100 MG capsule Take 1 capsule (100 mg total) by mouth 2 (two) times daily. 20  capsule 0  . methocarbamol (ROBAXIN) 500 MG tablet Take 1 tablet (500 mg total) by mouth 4 (four) times daily. 50 tablet 0   No facility-administered medications prior to visit.     Review of Systems  Review of Systems  Constitutional: Negative.   Respiratory: Negative.   Musculoskeletal:       Left upper arm pain, decreased ROM      Physical Exam  BP 110/68 (BP Location: Right Arm, Cuff Size: Normal)   Pulse 99   Temp 98.4 F (36.9 C)   Ht 4' 11.5" (1.511 m)   Wt 113 lb 9.6 oz (51.5 kg)   SpO2 98%   BMI 22.56 kg/m  Physical Exam  Constitutional: She is oriented to person, place, and time. She appears well-developed and well-nourished.  HENT:  Head: Normocephalic and atraumatic.  Eyes: Pupils are equal, round, and reactive to light. EOM are normal.  Neck: Normal range of motion. Neck supple.  Cardiovascular: Normal rate.  No murmur heard. Pulmonary/Chest: Effort normal. No respiratory distress. She has no wheezes.  Musculoskeletal:  Limited active ROM to left shoulder. Passive ROM to 160 degrees abduction with assist.  Left upper deltoid tender to touch. No erythema or warmth.   Neurological: She is alert and oriented to person, place, and time.  Skin: Skin is warm and dry. No rash noted. No erythema.  Psychiatric: She has  a normal mood and affect. Her behavior is normal. Judgment normal.     Lab Results:  CBC    Component Value Date/Time   WBC 7.1 10/01/2017 1059   RBC 4.59 10/01/2017 1059   HGB 14.8 10/01/2017 1059   HCT 43.6 10/01/2017 1059   PLT 244.0 10/01/2017 1059   MCV 94.9 10/01/2017 1059   MCHC 33.9 10/01/2017 1059   RDW 12.2 10/01/2017 1059   LYMPHSABS 1.6 10/01/2017 1059   MONOABS 0.4 10/01/2017 1059   EOSABS 0.1 10/01/2017 1059   BASOSABS 0.1 10/01/2017 1059    BMET    Component Value Date/Time   NA 139 10/01/2017 1059   K 4.2 10/01/2017 1059   CL 104 10/01/2017 1059   CO2 26 10/01/2017 1059   GLUCOSE 118 (H) 10/01/2017 1059   GLUCOSE  94 08/21/2006 0923   BUN 10 10/01/2017 1059   CREATININE 0.78 10/01/2017 1059   CALCIUM 9.4 10/01/2017 1059   GFRNONAA >60 11/10/2009 1851   GFRAA  11/10/2009 1851    >60        The eGFR has been calculated using the MDRD equation. This calculation has not been validated in all clinical situations. eGFR's persistently <60 mL/min signify possible Chronic Kidney Disease.    BNP No results found for: BNP  ProBNP No results found for: PROBNP  Imaging: No results found.   Assessment & Plan:   Shoulder impingement syndrome, left - Suspect from recent influenza vaccine 2 weeks ago (SIRVA "Shoulder injury related to vaccination administration) - Recommend course of oral steroids (prednisone 58m x 5 days) - Unable to attend PT d/t job, encourage gentle range of motion exercises - Refer to orthopedics    EMartyn Ehrich NP 06/22/2018

## 2018-06-22 NOTE — Assessment & Plan Note (Addendum)
-   Suspect from recent influenza vaccine 2 weeks ago (SIRVA "Shoulder injury related to vaccination administration) - Recommend course of oral steroids (prednisone 20mg  x 5 days) - Unable to attend PT d/t job, encourage gentle range of motion exercises - Refer to orthopedics

## 2018-06-22 NOTE — Patient Instructions (Addendum)
Prednisone 20 mg x 5 days  Gentle range of motion exercises  ICE compress   Refer to orthopedics re: SIRVA "Shoulder injury related to vaccination administration" and left shoulder impingement     Shoulder Range of Motion Exercises Shoulder range of motion (ROM) exercises are designed to keep the shoulder moving freely. They are often recommended for people who have shoulder pain. Phase 1 exercises When you are able, do this exercise 5-6 days per week, or as told by your health care provider. Work toward doing 2 sets of 10 swings. Pendulum Exercise How To Do This Exercise Lying Down 1. Lie face-down on a bed with your abdomen close to the side of the bed. 2. Let your arm hang over the side of the bed. 3. Relax your shoulder, arm, and hand. 4. Slowly and gently swing your arm forward and back. Do not use your neck muscles to swing your arm. They should be relaxed. If you are struggling to swing your arm, have someone gently swing it for you. When you do this exercise for the first time, swing your arm at a 15 degree angle for 15 seconds, or swing your arm 10 times. As pain lessens over time, increase the angle of the swing to 30-45 degrees. 5. Repeat steps 1-4 with the other arm.  How To Do This Exercise While Standing 1. Stand next to a sturdy chair or table and hold on to it with your hand. 1. Bend forward at the waist. 2. Bend your knees slightly. 3. Relax your other arm and let it hang limp. 4. Relax the shoulder blade of the arm that is hanging and let it drop. 5. While keeping your shoulder relaxed, use body motion to swing your arm in small circles. The first time you do this exercise, swing your arm for about 30 seconds or 10 times. When you do it next time, swing your arm for a little longer. 6. Stand up tall and relax. 7. Repeat steps 1-7, this time changing the direction of the circles. 2. Repeat steps 1-8 with the other arm.  Phase 2 exercises Do these exercises 3-4 times  per day on 5-6 days per week or as told by your health care provider. Work toward holding the stretch for 20 seconds. Stretching Exercise 1 1. Lift your arm straight out in front of you. 2. Bend your arm 90 degrees at the elbow (right angle) so your forearm goes across your body and looks like the letter "L." 3. Use your other arm to gently pull the elbow forward and across your body. 4. Repeat steps 1-3 with the other arm. Stretching Exercise 2 You will need a towel or rope for this exercise. 1. Bend one arm behind your back with the palm facing outward. 2. Hold a towel with your other hand. 3. Reach the arm that holds the towel above your head, and bend that arm at the elbow. Your wrist should be behind your neck. 4. Use your free hand to grab the free end of the towel. 5. With the higher hand, gently pull the towel up behind you. 6. With the lower hand, pull the towel down behind you. 7. Repeat steps 1-6 with the other arm.  Phase 3 exercises Do each of these exercises at four different times of day (sessions) every day or as told by your health care provider. To begin with, repeat each exercise 5 times (repetitions). Work toward doing 3 sets of 12 repetitions or as told by  your health care provider. Strengthening Exercise 1 You will need a light weight for this activity. As you grow stronger, you may use a heavier weight. 1. Standing with a weight in your hand, lift your arm straight out to the side until it is at the same height as your shoulder. 2. Bend your arm at 90 degrees so that your fingers are pointing to the ceiling. 3. Slowly raise your hand until your arm is straight up in the air. 4. Repeat steps 1-3 with the other arm.  Strengthening Exercise 2 You will need a light weight for this activity. As you grow stronger, you may use a heavier weight. 1. Standing with a weight in your hand, gradually move your straight arm in an arc, starting at your side, then out in front of  you, then straight up over your head. 2. Gradually move your other arm in an arc, starting at your side, then out in front of you, then straight up over your head. 3. Repeat steps 1-2 with the other arm.  Strengthening Exercise 3 You will need an elastic band for this activity. As you grow stronger, gradually increase the size of the bands or increase the number of bands that you use at one time. 1. While standing, hold an elastic band in one hand and raise that arm up in the air. 2. With your other hand, pull down the band until that hand is by your side. 3. Repeat steps 1-2 with the other arm.  This information is not intended to replace advice given to you by your health care provider. Make sure you discuss any questions you have with your health care provider. Document Released: 05/10/2003 Document Revised: 04/06/2016 Document Reviewed: 08/07/2014 Elsevier Interactive Patient Education  [employer].

## 2018-06-24 ENCOUNTER — Telehealth: Payer: Self-pay | Admitting: Pulmonary Disease

## 2018-06-24 NOTE — Telephone Encounter (Signed)
[email or web address] Natalie Ford ° °

## 2018-06-25 NOTE — Telephone Encounter (Signed)
Spoke to Natalie Ford she has given me the info for her case worker Natalie Ford [phone] to call and let her make this ortho appt Natalie Ford

## 2018-06-25 NOTE — Telephone Encounter (Signed)
I spoke to [employer] as soon as she gets all of Lenell's medical records she will take care of what needs to be done Tobe Sos

## 2018-06-25 NOTE — Telephone Encounter (Signed)
[email or web address] Natalie Ford ° °

## 2018-11-14 ENCOUNTER — Telehealth: Payer: No Typology Code available for payment source | Admitting: Family

## 2018-11-14 DIAGNOSIS — R6889 Other general symptoms and signs: Secondary | ICD-10-CM

## 2018-11-14 MED ORDER — BENZONATATE 100 MG PO CAPS: 100.0000 mg | ORAL_CAPSULE | Freq: Three times a day (TID) | ORAL | 0 refills | Status: DC | PRN

## 2018-11-14 NOTE — Progress Notes (Signed)
E-Visit for Corona Virus Screening  Based on your current symptoms, it seems  that your symptoms could be related to the Coronavirus.    As discussed on the phone it is very important that you self-isolate yourself for the next 14 days and avoid all contact with anyone over the age of 90 years or older!!!   Lots of rest, force fluids, wash your hands frequently, and Tylenol or Motrin as needed. Hope you feel better soon!  Discussed pt's blurred vision, could be related to sinus pressure or dehydration. Discussed that if symptoms worsen she would need to be evaluated face to face.  Pt stable at this time.   Approximately 5 minutes was spent documenting and reviewing patient's chart.     Coronavirus disease 2019 (COVID-19) is a respiratory illness that can spread from person to person. The virus that causes COVID-19 is a new virus that was first identified in the country of [place] but is now found in multiple other countries and has spread to the [place].  Symptoms associated with the virus are mild to severe fever, cough, and shortness of breath. There is currently no vaccine to protect against COVID-19, and there is no specific antiviral treatment for the virus.   To be considered HIGH RISK for Coronavirus (COVID-19), you have to meet the following criteria:  . Traveled to [place], [place], [place], [place] or [place]; or in the [place] to [place], [place], [place], or [place]; and have fever, cough, and shortness of breath within the last 2 weeks of travel OR  . Been in close contact with a person diagnosed with COVID-19 within the last 2 weeks and have fever, cough, and shortness of breath  . IF YOU DO NOT MEET THESE CRITERIA, YOU ARE CONSIDERED LOW RISK FOR COVID-19.   It is vitally important that if you feel that you have an infection such as this virus or any other virus that you stay home and away from places where you may spread it to others.  You should self-quarantine  for 14 days if you have symptoms that could potentially be coronavirus and avoid contact with people age 65 and older.   You can use medication such as A prescription cough medication called Tessalon Perles 100 mg. You may take 1-2 capsules every 8 hours as needed for cough  You may also take acetaminophen (Tylenol) as needed for fever.   Reduce your risk of any infection by using the same precautions used for avoiding the common cold or flu:  [name] Wash your hands often with soap and warm water for at least 20 seconds.  If soap and water are not readily available, use an alcohol-based hand sanitizer with at least 60% alcohol.  . If coughing or sneezing, cover your mouth and nose by coughing or sneezing into the elbow areas of your shirt or coat, into a tissue or into your sleeve (not your hands). . Avoid shaking hands with others and consider head nods or verbal greetings only. . Avoid touching your eyes, nose, or mouth with unwashed hands.  . Avoid close contact with people who are sick. . Avoid places or events with large numbers of people in one location, like concerts or sporting events. . Carefully consider travel plans you have or are making. . If you are planning any travel outside or inside the [place], visit the CDC's Travelers' Health webpage for the latest health notices. . If you have some symptoms but not all  symptoms, continue to monitor at home and seek medical attention if your symptoms worsen. . If you are having a medical emergency, call 911.  HOME CARE . Only take medications as instructed by your medical team. . Drink plenty of fluids and get plenty of rest. . A steam or ultrasonic humidifier can help if you have congestion.   GET HELP RIGHT AWAY IF: . You develop worsening fever. . You become short of breath . You cough up blood. . Your symptoms become more severe MAKE SURE YOU   Understand these instructions.  Will watch your condition.  Will get help right away if  you are not doing well or get worse.  Your e-visit answers were reviewed by a board certified advanced clinical practitioner to complete your personal care plan.  Depending on the condition, your plan could have included both over the counter or prescription medications.  If there is a problem please reply once you have received a response from your provider. Your safety is important to us.  If you have drug allergies check your prescription carefully.    You can use MyChart to ask questions about today's visit, request a non-urgent call back, or ask for a work or school excuse for 24 hours related to this e-Visit. If it has been greater than 24 hours you will need to follow up with your provider, or enter a new e-Visit to address those concerns. You will get an e-mail in the next two days asking about your experience.  I hope that your e-visit has been valuable and will speed your recovery. Thank you for using e-visits.

## 2018-11-15 ENCOUNTER — Telehealth: Payer: Self-pay | Admitting: Primary Care

## 2018-11-15 NOTE — Telephone Encounter (Signed)
Primary Pulmonologist: Former [facility] PCP but has seen Buelah Manis, NP Last office visit and with whom: 06/22/18 with Buelah Manis What do we see them for (pulmonary problems): Former [facility] PCP pt Last OV assessment/plan:  Instructions   Prednisone 20 mg x 5 days  Gentle range of motion exercises  ICE compress   Refer to orthopedics re: SIRVA "Shoulder injury related to vaccination administration" and left shoulder impingement         Was appointment offered to patient (explain)?  No, pt believes she needs to go be tested for coronavirus   Reason for call: Called and spoke with pt who stated she did evisit 11/14/2018 and was told that all of her symptoms point towards coronavirus and stated that she should   Pt has had symptoms of fever of 100.5, blurry vision, body aches. Pt denies any cough or any respiratory symptoms. Pt has had a headache.  Pt's symptoms began Saturday 3/21.  Pt stated she did travel to [place] last month around 2/17 and stated she did fly.  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  Based on pt's symptoms, she believes she needs to go and be tested for coronavirus. Beth, please advise on recs for pt. Thanks!

## 2018-11-15 NOTE — Telephone Encounter (Signed)
Attempted to call pt back but line went immediately to VM. Left message for pt to return call.

## 2018-11-15 NOTE — Telephone Encounter (Signed)
Pt is calling back [phone]

## 2018-11-15 NOTE — Telephone Encounter (Signed)
Pt returning call [phone]

## 2018-11-15 NOTE — Telephone Encounter (Signed)
Who did she have an e-vist with? If they felt her symptoms were consistent with covid did they not order the test?  If she were exposed to Covid-19 in [place] she would have had symptoms earlier than now. Theres a 14 day incubation period. 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 Advise fluids and tylenol. She should be free from fever for 3 straight day (check temp every 4 hours) and respiratory symptoms x 7 dyays.   She needs to let her supervisor know, they should have instructions on what to do if staff are sick   Please offer her an e-vist with me this afternoon if she would like

## 2018-11-15 NOTE — Telephone Encounter (Signed)
Spoke with patient. Advised her of Beth's recommendations, she verbalized understanding. Advised her to call us back if she had any other questions or if the number provided did not help her.   Nothing further needed at time of call.

## 2018-11-15 NOTE — Telephone Encounter (Signed)
Attempted to call pt but unable to reach her. Left message for pt to return call. 

## 2018-11-15 NOTE — Telephone Encounter (Signed)
Pt is giving Natalie Ford a new number to call her on [phone]

## 2018-12-13 ENCOUNTER — Telehealth: Payer: No Typology Code available for payment source | Admitting: Internal Medicine

## 2018-12-13 DIAGNOSIS — M791 Myalgia, unspecified site: Secondary | ICD-10-CM

## 2018-12-13 DIAGNOSIS — R059 Cough, unspecified: Secondary | ICD-10-CM

## 2018-12-13 DIAGNOSIS — R05 Cough: Secondary | ICD-10-CM

## 2018-12-13 DIAGNOSIS — R509 Fever, unspecified: Secondary | ICD-10-CM

## 2018-12-13 NOTE — Progress Notes (Signed)
Based on what you shared with me, I feel your condition warrants further evaluation and I recommend that you be seen for a face to face office visit. Being that you are not improving after 30 days I am concerned of a secondary infection, but Covid should be ruled out. It is good to know you have been quarantined and have not left the house.      NOTE: If you entered your credit card information for this eVisit, you will not be charged. You may see a "hold" on your card for the $35 but that hold will drop off and you will not have a charge processed.  If you are having a true medical emergency please call 911.  If you need an urgent face to face visit, [place] has four urgent care centers for your convenience.    PLEASE NOTE: THE [facility] LOCATIONS AND URGENT CARE CLINICS DO NOT HAVE THE TESTING FOR CORONAVIRUS COVID19 AVAILABLE.  IF YOU FEEL YOU NEED THIS TEST YOU MUST GO TO A TRIAGE LOCATION AT ONE OF THE HOSPITAL EMERGENCY DEPARTMENTS   [email or web address] to reserve your spot online an avoid wait times  [facility] [address] [place], [place] [address] Modified hours of operation: Monday-Friday, 10 AM to 6 PM  Saturday & Sunday 10 AM to 4 PM *Across the street from [employer]  [employer] [employer] (New Address!) [address] [place], [place] [address] *Just off [employer], across the road from [place]* Modified hours of operation: Monday-Friday, 10 AM to 5 PM  Closed Saturday & Sunday   The following sites will take your insurance:  . [facility]  [phone] Get Driving Directions Find a Provider at this Location  [address] [place], [place] [address] . 10 am to 8 pm Monday-Friday . 12 pm to 8 pm Saturday-Sunday   . [facility] at [facility]  [phone] Get Driving Directions Find a Provider at this Location  [address], [address]  [place], [place] [address] . 8 am to 8 pm Monday-Friday . 9 am to 6 pm Saturday . 11 am to 6 pm Sunday   . [facility] at [facility]  [phone] Get Driving Directions  [address] [place], [place] [address] . 8 am to 8 pm Monday-Friday . 8 am to 4 pm Saturday-Sunday   Your e-visit answers were reviewed by a board certified advanced clinical practitioner to complete your personal care plan.  Thank you for using e-Visits.

## 2018-12-14 ENCOUNTER — Other Ambulatory Visit: Payer: Self-pay

## 2018-12-14 ENCOUNTER — Ambulatory Visit (HOSPITAL_COMMUNITY)
Admission: EM | Admit: 2018-12-14 | Discharge: 2018-12-14 | Disposition: A | Discharge status: Home / Self Care | Payer: No Typology Code available for payment source | Attending: Family Medicine | Admitting: Family Medicine

## 2018-12-14 ENCOUNTER — Ambulatory Visit (INDEPENDENT_AMBULATORY_CARE_PROVIDER_SITE_OTHER): Payer: No Typology Code available for payment source

## 2018-12-14 DIAGNOSIS — R0602 Shortness of breath: Secondary | ICD-10-CM | POA: Diagnosis not present

## 2018-12-14 DIAGNOSIS — M791 Myalgia, unspecified site: Secondary | ICD-10-CM

## 2018-12-14 DIAGNOSIS — R509 Fever, unspecified: Secondary | ICD-10-CM

## 2018-12-14 MED ORDER — ALBUTEROL SULFATE HFA 108 (90 BASE) MCG/ACT IN AERS: 1.0000 | INHALATION_SPRAY | Freq: Four times a day (QID) | RESPIRATORY_TRACT | 0 refills | Status: DC | PRN

## 2018-12-14 MED ORDER — AZITHROMYCIN 250 MG PO TABS: 250.0000 mg | ORAL_TABLET | Freq: Every day | ORAL | 0 refills | Status: DC

## 2018-12-14 MED ORDER — PREDNISONE 10 MG (21) PO TBPK: ORAL_TABLET | ORAL | 0 refills | Status: DC

## 2018-12-14 NOTE — ED Provider Notes (Signed)
[facility]    CSN: [id number] Arrival date & time: 12/14/18  1043     History   Chief Complaint No chief complaint on file.   HPI Natalie Ford is a 48 y.o. female.   Patient is a 48 year old female with past medical history of allergic rhinitis, dyspepsia, hypercholesterolemia, IBS.  She has been sick for over a month with cough, fever, body aches that have been waxing waning.  She was seen with E visit approximate 1 month ago and told to quarantine due to possible COVID exposure.  Test revealed negative results at that time but reports there was some mix up with the test and unsure of how accurate the results were. She has been at home working for a month.  She is a Camera operatormental health nurse.  No known new exposures. 3 people from her job have had similar symptoms and are still sick. She has had increased shortness of breath and gets fatigued just from minor activities. Severe myalgias.  Subjective fever of 100 at home. Fever comes at night. She has been taking tylenol without much relief of her symptoms.  She has been checking her oxygen saturations which are around 97%. She is an avid runner and is getting SOB just taking a shower. She does not smoke. No recent travels. No hx of DVT, PE. No LE edema.   ROS per HPI      Past Medical History:  Diagnosis Date  . Allergic rhinitis   . Dyspepsia   . Hypercholesteremia   . IBS (irritable bowel syndrome)   . Lumbosacral strain     Patient Active Problem List   Diagnosis Date Noted  . Shoulder impingement syndrome, left 06/22/2018  . Left leg pain 11/18/2017  . Carpal tunnel syndrome 10/01/2017  . Kidney stones 03/30/2014  . Physical exam, annual 06/29/2013  . Anxiety 06/29/2013  . Borderline hypercholesterolemia 05/14/2010  . DYSPEPSIA 05/14/2010  . LUMBOSACRAL STRAIN 05/14/2010  . Allergic rhinitis 05/15/2009  . UPPER RESPIRATORY INFECTION 05/09/2009  . GASTROENTERITIS 12/25/2007  . IRRITABLE BOWEL SYNDROME  12/25/2007    Past Surgical History:  Procedure Laterality Date  . CARPAL TUNNEL RELEASE Right   . CESAREAN SECTION     x 2    OB History   No obstetric history on file.      Home Medications    Prior to Admission medications   Medication Sig Start Date End Date Taking? Authorizing Provider  albuterol (VENTOLIN HFA) 108 (90 Base) MCG/ACT inhaler Inhale 1-2 puffs into the lungs every 6 (six) hours as needed for wheezing or shortness of breath. 12/14/18   Earnesteen Birnie, Chasiti A, NP  azithromycin (ZITHROMAX) 250 MG tablet Take 1 tablet (250 mg total) by mouth daily. Take first 2 tablets together, then 1 every day until finished. 12/14/18   Dahlia ByesBast, Kamber A, NP  ibuprofen (ADVIL,MOTRIN) 200 MG tablet Take 200 mg by mouth every 6 (six) hours as needed.    [provider]  Multiple Vitamin (MULTIVITAMIN) capsule Take 1 capsule by mouth as needed.     [provider]  predniSONE (STERAPRED UNI-PAK 21 TAB) 10 MG (21) TBPK tablet 6 tabs for 1 day, then 5 tabs for 1 das, then 4 tabs for 1 day, then 3 tabs for 1 day, 2 tabs for 1 day, then 1 tab for 1 day 12/14/18   Janace ArisBast, Raiden A, NP    Family History Family History  Problem Relation Age of Onset  . Diabetes Mother   .  Heart disease Mother   . Leukemia Father   . Stomach cancer Paternal Grandfather   . Kidney disease Paternal Aunt     Social History Social History   Tobacco Use  . Smoking status: Never Smoker  . Smokeless tobacco: Never Used  . Tobacco comment: exposed to second hand smoke  Substance Use Topics  . Alcohol use: Yes    Comment: social use  . Drug use: No     Allergies   Penicillins   Review of Systems Review of Systems   Physical Exam Triage Vital Signs ED Triage Vitals  Enc Vitals Group     BP      Pulse      Resp      Temp      Temp src      SpO2      Weight      Height      Head Circumference      Peak Flow      Pain Score      Pain Loc      Pain Edu?      Excl. in GC?    No data  found.  Updated Vital Signs BP 137/77   Pulse 89   Temp 98.7 F (37.1 C)   SpO2 100%   Visual Acuity Right Eye Distance:   Left Eye Distance:   Bilateral Distance:    Right Eye Near:   Left Eye Near:    Bilateral Near:     Physical Exam Vitals signs and nursing note reviewed.  Constitutional:      General: She is not in acute distress.    Appearance: Normal appearance. She is well-developed. She is not ill-appearing, toxic-appearing or diaphoretic.  HENT:     Head: Normocephalic and atraumatic.     Right Ear: Tympanic membrane and ear canal normal.     Left Ear: Tympanic membrane and ear canal normal.     Nose: Nose normal.     Mouth/Throat:     Pharynx: Oropharynx is clear.  Eyes:     Conjunctiva/sclera: Conjunctivae normal.  Neck:     Musculoskeletal: Normal range of motion and neck supple.  Cardiovascular:     Rate and Rhythm: Normal rate and regular rhythm.     Heart sounds: No murmur.  Pulmonary:     Effort: Pulmonary effort is normal. No respiratory distress.     Breath sounds: Normal breath sounds.  Musculoskeletal: Normal range of motion.  Skin:    General: Skin is warm and dry.  Neurological:     Mental Status: She is alert.  Psychiatric:        Mood and Affect: Mood normal.      UC Treatments / Results  Labs (all labs ordered are listed, but only abnormal results are displayed) Labs Reviewed - No data to display  EKG None  Radiology Dg Chest 2 View  Result Date: 12/14/2018 CLINICAL DATA:  Cough.  Fever and body aches. EXAM: CHEST - 2 VIEW COMPARISON:  10/01/2017 FINDINGS: The heart size and mediastinal contours are within normal limits. Both lungs are clear. The visualized skeletal structures are unremarkable. Trachea is midline. No pleural effusions. IMPRESSION: No active cardiopulmonary disease. Electronically Signed   By: Richarda Overlie M.D.   On: 12/14/2018 11:25    Procedures Procedures (including critical care time)  Medications Ordered  in UC Medications - No data to display  Initial Impression / Assessment and Plan / UC Course  I  have reviewed the triage vital signs and the nursing notes.  Pertinent labs & imaging results that were available during my care of the patient were reviewed by me and considered in my medical decision making (see chart for details).     Cough, SOB, fatigue  Pt is a healthy 48 year old that has been sick off an on x 1 month.  Most likely she had COVID based on symptoms and length of symptoms.  She has been at home for 1 month quarantine and not getting any better. sts that she has good days and bad days.  X ray here was normal VSS and she is non toxic or ill appearing Lungs clear She has only been taking tylenol which doesn't help much.  Her worse symptoms are the SOB, fatigue and myalgias.  3 of her coworkers have also been sick x 1 month.  Since she has had symptoms for over  A month and I feel it would be appropriate to treat for lower respiratory tract infection and inflammation in the lungs.  I feel she may have some muscle fatigue causing the myalgias from being more sedentary than usual.  Instructed that if she doesn't get better or worse symptoms to call her doctor for further work up and management.  Pt understanding and agrees to plan.  Final Clinical Impressions(s) / UC Diagnoses   Final diagnoses:  SOB (shortness of breath)  Fever, unspecified  Myalgia     Discharge Instructions     Sending medications to the pharmacy to treat your symptoms.  Take as prescribed Follow up as needed for continued or worsening symptoms     ED Prescriptions    Medication Sig Dispense Auth. Provider   predniSONE (STERAPRED UNI-PAK 21 TAB) 10 MG (21) TBPK tablet 6 tabs for 1 day, then 5 tabs for 1 das, then 4 tabs for 1 day, then 3 tabs for 1 day, 2 tabs for 1 day, then 1 tab for 1 day 21 tablet Elaine Roanhorse, Rhianna A, NP   azithromycin (ZITHROMAX) 250 MG tablet Take 1 tablet (250 mg total) by  mouth daily. Take first 2 tablets together, then 1 every day until finished. 6 tablet Lyann Hagstrom, Emaleigh A, NP   albuterol (VENTOLIN HFA) 108 (90 Base) MCG/ACT inhaler Inhale 1-2 puffs into the lungs every 6 (six) hours as needed for wheezing or shortness of breath. 1 Inhaler Dahlia Byes A, NP     Controlled Substance Prescriptions [place] Controlled Substance Registry consulted? Not Applicable   [place], [place], NP 12/14/18 1337

## 2018-12-14 NOTE — Discharge Instructions (Addendum)
Sending medications to the pharmacy to treat your symptoms.  Take as prescribed Follow up as needed for continued or worsening symptoms

## 2018-12-14 NOTE — ED Triage Notes (Signed)
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 With other sick coworkers, symptoms have not improved

## 2019-01-25 ENCOUNTER — Encounter (HOSPITAL_COMMUNITY): Payer: Self-pay

## 2019-01-25 ENCOUNTER — Ambulatory Visit (HOSPITAL_COMMUNITY)
Admission: EM | Admit: 2019-01-25 | Discharge: 2019-01-25 | Disposition: A | Discharge status: Home / Self Care | Payer: No Typology Code available for payment source | Attending: Family Medicine | Admitting: Family Medicine

## 2019-01-25 ENCOUNTER — Other Ambulatory Visit: Payer: Self-pay

## 2019-01-25 ENCOUNTER — Telehealth: Payer: Self-pay

## 2019-01-25 DIAGNOSIS — Z20822 Contact with and (suspected) exposure to covid-19: Secondary | ICD-10-CM

## 2019-01-25 DIAGNOSIS — R6889 Other general symptoms and signs: Secondary | ICD-10-CM

## 2019-01-25 NOTE — ED Triage Notes (Signed)
Patient presents to Urgent Care with complaints of recurrent feelings of fatigue and shortness of breath, slight cough since about a month ago. Patient reports she has been returning to the office a few days a week, and that is when her symptoms worsened. Pt feels like she may have a low grade fever but she has not checked it.

## 2019-01-25 NOTE — ED Provider Notes (Signed)
[facility]    CSN: [id number] Arrival date & time: 01/25/19  1721     History   Chief Complaint Chief Complaint  Patient presents with  . Fatigue  . Cough    HPI Natalie Ford is a 48 y.o. female history of IBS and allergic rhinitis presenting for fatigue, dyspnea on exertion, cough.  Patient states she has been having symptoms for over a month now; was evaluated by urgent care on 4/21.  Chest x-ray done at that time was negative for acute process, patient given azithromycin and prednisone which helped alleviate symptoms.  Patient states "I just have not kicked it yet ".  Patient states that she is tried going back to work few hours in the morning, though has been easily fatigued with this.  Patient endorsing fatigue, myalgias, dry nonproductive cough (no hemoptysis).  Patient was given albuterol inhaler which helps alleviate chest tightness.  Patient reports new onset diarrhea that is without blood or melena over the last few weeks.  States this is intermittent, has been drinking lots of water, taking Tylenol and ibuprofen for myalgias.  Patient states that she was tested for coronavirus in late March: Negative.  Patient states that was working closely with coworkers who did test positive for coronavirus, of note patient had a coworker who passed away from complications of coronavirus.   Past Medical History:  Diagnosis Date  . Allergic rhinitis   . Dyspepsia   . Hypercholesteremia   . IBS (irritable bowel syndrome)   . Lumbosacral strain     Patient Active Problem List   Diagnosis Date Noted  . Shoulder impingement syndrome, left 06/22/2018  . Left leg pain 11/18/2017  . Carpal tunnel syndrome 10/01/2017  . Kidney stones 03/30/2014  . Physical exam, annual 06/29/2013  . Anxiety 06/29/2013  . Borderline hypercholesterolemia 05/14/2010  . DYSPEPSIA 05/14/2010  . LUMBOSACRAL STRAIN 05/14/2010  . Allergic rhinitis 05/15/2009  . UPPER RESPIRATORY INFECTION  05/09/2009  . GASTROENTERITIS 12/25/2007  . IRRITABLE BOWEL SYNDROME 12/25/2007    Past Surgical History:  Procedure Laterality Date  . CARPAL TUNNEL RELEASE Right   . CESAREAN SECTION     x 2    OB History   No obstetric history on file.      Home Medications    Prior to Admission medications   Medication Sig Start Date End Date Taking? Authorizing Provider  albuterol (VENTOLIN HFA) 108 (90 Base) MCG/ACT inhaler Inhale 1-2 puffs into the lungs every 6 (six) hours as needed for wheezing or shortness of breath. 12/14/18   Bast, Arienne A, NP  ibuprofen (ADVIL,MOTRIN) 200 MG tablet Take 200 mg by mouth every 6 (six) hours as needed.    [provider]  Multiple Vitamin (MULTIVITAMIN) capsule Take 1 capsule by mouth as needed.     [provider]    Family History Family History  Problem Relation Age of Onset  . Diabetes Mother   . Heart disease Mother   . Leukemia Father   . Stomach cancer Paternal Grandfather   . Kidney disease Paternal Aunt     Social History Social History   Tobacco Use  . Smoking status: Never Smoker  . Smokeless tobacco: Never Used  . Tobacco comment: exposed to second hand smoke  Substance Use Topics  . Alcohol use: Yes    Comment: social use  . Drug use: No     Allergies   Penicillins   Review of Systems As per HPI   Physical  Exam Triage Vital Signs ED Triage Vitals  Enc Vitals Group     BP 01/25/19 1802 120/68     Pulse Rate 01/25/19 1802 77     Resp 01/25/19 1802 19     Temp 01/25/19 1802 98.4 F (36.9 C)     Temp Source 01/25/19 1802 Oral     SpO2 01/25/19 1802 100 %     Weight --      Height --      Head Circumference --      Peak Flow --      Pain Score 01/25/19 1800 0     Pain Loc --      Pain Edu? --      Excl. in GC? --    No data found.  Updated Vital Signs BP 120/68 (BP Location: Left Arm)   Pulse 77   Temp 98.4 F (36.9 C) (Oral)   Resp 19   SpO2 100%   Visual Acuity Right Eye  Distance:   Left Eye Distance:   Bilateral Distance:    Right Eye Near:   Left Eye Near:    Bilateral Near:     Physical Exam Constitutional:      General: She is not in acute distress. HENT:     Head: Normocephalic and atraumatic.  Eyes:     General: No scleral icterus.    Pupils: Pupils are equal, round, and reactive to light.  Cardiovascular:     Rate and Rhythm: Normal rate.  Pulmonary:     Effort: Pulmonary effort is normal. No respiratory distress.     Breath sounds: No wheezing.  Skin:    General: Skin is warm.     Coloration: Skin is not jaundiced or pale.  Neurological:     Mental Status: She is alert and oriented to person, place, and time.  Psychiatric:        Mood and Affect: Mood normal.        Behavior: Behavior normal.        Thought Content: Thought content normal.        Judgment: Judgment normal.      UC Treatments / Results  Labs (all labs ordered are listed, but only abnormal results are displayed) Labs Reviewed - No data to display  EKG None  Radiology No results found.  Procedures Procedures (including critical care time)  Medications Ordered in UC Medications - No data to display  Initial Impression / Assessment and Plan / UC Course  I have reviewed the triage vital signs and the nursing notes.  Pertinent labs & imaging results that were available during my care of the patient were reviewed by me and considered in my medical decision making (see chart for details).     48 year old female with persistent fatigue, cough, myalgias that is concerning for coronavirus despite negative test late March.  Recommended that patient undergo COVID screening again and self-isolate.  Continue p.o. hydration and Tylenol for relief of myalgias.  Discussed strict return precautions, patient verbalized understanding. Final Clinical Impressions(s) / UC Diagnoses   Final diagnoses:  Suspected Covid-19 Virus Infection     Discharge Instructions      Important to stay hydrated and drink plenty of water throughout the day.   May take Tylenol for fever and myalgias. Important that you self isolate until your COVID testing is resulted.    ED Prescriptions    None     Controlled Substance Prescriptions [place] Controlled Substance Registry consulted? Not  Applicable   Odette Fraction Spive[place], [place] 01/25/19 1915

## 2019-01-25 NOTE — Discharge Instructions (Addendum)
Important to stay hydrated and drink plenty of water throughout the day.   May take Tylenol for fever and myalgias. Important that you self isolate until your COVID testing is resulted.

## 2019-01-25 NOTE — Telephone Encounter (Addendum)
Patient called, left VM to return call to speak to a nurse to schedule an appointment [phone] between 0700-1900 Monday through Friday.    ----- Message from [place], [name] sent at 01/25/2019  6:30 PM EDT ----- Regarding: need COVID testing Myalgias, cough, dyspnea, known exposure

## 2019-01-26 ENCOUNTER — Other Ambulatory Visit: Payer: Self-pay

## 2019-01-26 ENCOUNTER — Telehealth: Payer: Self-pay

## 2019-01-26 DIAGNOSIS — Z20822 Contact with and (suspected) exposure to covid-19: Secondary | ICD-10-CM

## 2019-01-26 NOTE — Telephone Encounter (Signed)
Pt returned call for COVID-19 testing. Appointment scheduled Order placed. [place] Hall-PotvinPA-C  [facility] urgent care

## 2019-01-28 LAB — NOVEL CORONAVIRUS, NAA: SARS-CoV-2, NAA: NOT DETECTED

## 2019-03-21 ENCOUNTER — Other Ambulatory Visit: Payer: Self-pay

## 2019-03-21 ENCOUNTER — Emergency Department (HOSPITAL_COMMUNITY): Payer: No Typology Code available for payment source

## 2019-03-21 ENCOUNTER — Encounter (HOSPITAL_COMMUNITY): Payer: Self-pay | Admitting: Emergency Medicine

## 2019-03-21 ENCOUNTER — Emergency Department (HOSPITAL_COMMUNITY)
Admission: EM | Admit: 2019-03-21 | Discharge: 2019-03-21 | Disposition: A | Discharge status: Home / Self Care | Payer: No Typology Code available for payment source | Attending: Emergency Medicine | Admitting: Emergency Medicine

## 2019-03-21 DIAGNOSIS — M7918 Myalgia, other site: Secondary | ICD-10-CM | POA: Insufficient documentation

## 2019-03-21 DIAGNOSIS — R202 Paresthesia of skin: Secondary | ICD-10-CM | POA: Diagnosis not present

## 2019-03-21 DIAGNOSIS — E7800 Pure hypercholesterolemia, unspecified: Secondary | ICD-10-CM | POA: Insufficient documentation

## 2019-03-21 DIAGNOSIS — R079 Chest pain, unspecified: Secondary | ICD-10-CM | POA: Insufficient documentation

## 2019-03-21 DIAGNOSIS — R519 Headache, unspecified: Secondary | ICD-10-CM

## 2019-03-21 DIAGNOSIS — R51 Headache: Secondary | ICD-10-CM | POA: Insufficient documentation

## 2019-03-21 LAB — COMPREHENSIVE METABOLIC PANEL
ALT: 16 U/L (ref 0–44)
AST: 18 U/L (ref 15–41)
Albumin: 4.2 g/dL (ref 3.5–5.0)
Alkaline Phosphatase: 33 U/L — ABNORMAL LOW (ref 38–126)
Anion gap: 9 (ref 5–15)
BUN: 12 mg/dL (ref 6–20)
CO2: 22 mmol/L (ref 22–32)
Calcium: 9.2 mg/dL (ref 8.9–10.3)
Chloride: 109 mmol/L (ref 98–111)
Creatinine, Ser: 0.9 mg/dL (ref 0.44–1.00)
GFR calc Af Amer: >60 mL/min (ref >60)
GFR calc non Af Amer: >60 mL/min (ref >60)
Glucose, Bld: 106 mg/dL — ABNORMAL HIGH (ref 70–99)
Potassium: 4.2 mmol/L (ref 3.5–5.1)
Sodium: 140 mmol/L (ref 135–145)
Total Bilirubin: 0.6 mg/dL (ref 0.3–1.2)
Total Protein: 6.3 g/dL — ABNORMAL LOW (ref 6.5–8.1)

## 2019-03-21 LAB — CBC WITH DIFFERENTIAL/PLATELET
Abs Immature Granulocytes: 0.02 10*3/uL (ref 0.00–0.07)
Basophils Absolute: 0.1 10*3/uL (ref 0.0–0.1)
Basophils Relative: 1 %
Eosinophils Absolute: 0 10*3/uL (ref 0.0–0.5)
Eosinophils Relative: 1 %
HCT: 40.7 % (ref 36.0–46.0)
Hemoglobin: 13.9 g/dL (ref 12.0–15.0)
Immature Granulocytes: 0 %
Lymphocytes Relative: 25 %
Lymphs Abs: 1.2 10*3/uL (ref 0.7–4.0)
MCH: 32.3 pg (ref 26.0–34.0)
MCHC: 34.2 g/dL (ref 30.0–36.0)
MCV: 94.7 fL (ref 80.0–100.0)
Monocytes Absolute: 0.4 10*3/uL (ref 0.1–1.0)
Monocytes Relative: 8 %
Neutro Abs: 3 10*3/uL (ref 1.7–7.7)
Neutrophils Relative %: 65 %
Platelets: 225 10*3/uL (ref 150–400)
RBC: 4.3 MIL/uL (ref 3.87–5.11)
RDW: 11.3 % — ABNORMAL LOW (ref 11.5–15.5)
WBC: 4.6 10*3/uL (ref 4.0–10.5)
nRBC: 0 % (ref 0.0–0.2)

## 2019-03-21 LAB — D-DIMER, QUANTITATIVE: D-Dimer, Quant: <0.27 ug/mL-FEU (ref 0.00–0.50)

## 2019-03-21 LAB — URINALYSIS, ROUTINE W REFLEX MICROSCOPIC
Bilirubin Urine: NEGATIVE
Glucose, UA: NEGATIVE mg/dL
Hgb urine dipstick: NEGATIVE
Ketones, ur: NEGATIVE mg/dL
Leukocytes,Ua: NEGATIVE
Nitrite: NEGATIVE
Protein, ur: NEGATIVE mg/dL
Specific Gravity, Urine: 1.008 (ref 1.005–1.030)
pH: 5 (ref 5.0–8.0)

## 2019-03-21 LAB — I-STAT BETA HCG BLOOD, ED (MC, WL, AP ONLY): I-stat hCG, quantitative: <5 m[IU]/mL (ref <5)

## 2019-03-21 LAB — LIPASE, BLOOD: Lipase: 31 U/L (ref 11–51)

## 2019-03-21 LAB — TROPONIN I (HIGH SENSITIVITY): Troponin I (High Sensitivity): 3 ng/L (ref <18)

## 2019-03-21 MED ORDER — DIPHENHYDRAMINE HCL 50 MG/ML IJ SOLN
25.0000 mg | Freq: Once | INTRAMUSCULAR | Status: AC
  Administered 2019-03-21: 25 mg via INTRAVENOUS
  Filled 2019-03-21: qty 1

## 2019-03-21 MED ORDER — SODIUM CHLORIDE 0.9 % IV BOLUS
1000.0000 mL | Freq: Once | INTRAVENOUS | Status: AC
  Administered 2019-03-21: 1000 mL via INTRAVENOUS

## 2019-03-21 MED ORDER — METOCLOPRAMIDE HCL 5 MG/ML IJ SOLN
10.0000 mg | Freq: Once | INTRAMUSCULAR | Status: AC
  Administered 2019-03-21: 10 mg via INTRAVENOUS
  Filled 2019-03-21: qty 2

## 2019-03-21 NOTE — Discharge Instructions (Signed)
As we discussed today, your work-up was reassuring.  I did not see any evidence of infection on your chest x-ray, infection in your urine.  Your CT head and MRI of head were normal.  Please follow-up with your primary care doctor regarding your symptoms.  Return emergency department for any chest pain, difficulty breathing, difficulty walking, numbness/weakness of your arms or legs or any other worsening or concerning symptoms.

## 2019-03-21 NOTE — ED Triage Notes (Signed)
Pt. Stated, I ve had chest pain off and on since March. Tested neg x 2 for COVID.  Dr. Hulen Skains and thinks I might have a enlarged heart. Ive also had a headache.

## 2019-03-21 NOTE — ED Provider Notes (Signed)
[facility] EMERGENCY DEPARTMENT Provider Note   CSN: [id number] Arrival date & time: 03/21/19  1146    History   Chief Complaint Chief Complaint  Patient presents with   Chest Pain   Headache    HPI Lynlee Stratton is a 48 y.o. female with PMH/o hypercholesterolemia, IBS, who presents for evaluation of intermittent chest pain that is been ongoing for last several months, headache and left upper extremity tingling sensation.  Patient reports that in March 2020, she started having some intermittent chest pain, myalgias.  She states that she was evaluated and was tested for COVID for which she was negative.  She states that she was treated for pneumonia with Levaquin, steroids.  She reports that since then, she has continued to have intermittent chest pain.  She states that approximately 10 days ago, the chest pain became constant.  She states that it has not gone away over the last 10 days but is only change in severity. She reports it has been constantly there but will become more severe at times.  She describes it as a "heaviness or pressure" on the left side that wraps around the side and on the back.  She stated does not go through to the back.  She states it is slightly worse with deep inspiration and with laying down.  She states that sitting up straight does not improve the pain.  Pain is not worse with exertion.  She has not had any associated shortness of breath, nausea, diaphoresis.  Patient also reports she has had a headache that is been ongoing for last week or so.  She describes it as a frontal headache.  She states it is gradual in nature and denies any thunderclap headache.  She denies any preceding trauma, injury, fall.  Patient also reports that this morning, she started experiencing some tingling sensation noted from her shoulder all the way down to her hand.  She has been able to move it without any difficulty but does states that she "feels like it is being  pricked."  Patient reports she is also concerned that she might have a UTI because her urine was "questionable."  She states that sometimes she occasionally will have some abdominal bloating sensation.  She denies any dysuria, hematuria.  Patient states she is also concerned because she was told that she "had an enlarged heart and would need an echo."  Patient denies any fevers, vomiting, difficulty ambulating. She denies any OCP use, recent immobilization, prior history of DVT/PE, recent surgery, leg swelling, or long travel.  Patient is not a smoker denies any history of hypertension, diabetes.  She states she has never had any personal cardiac history.  She does report that her mom had a heart attack at age 42.  Patient denies any recent travel.  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  She does report that 1 of the patients that she recently saw 4 days ago called and said that his wife tested positive for COVID. She was not exposed to the wife and she states she was wearing a mask the entire time.  Otherwise does not have any other known COVID exposure.     The history is provided by the patient.    Past Medical History:  Diagnosis Date   Allergic rhinitis    Dyspepsia    Hypercholesteremia    IBS (irritable bowel syndrome)    Lumbosacral strain     Patient Active Problem List   Diagnosis  Date Noted   Shoulder impingement syndrome, left 06/22/2018   Left leg pain 11/18/2017   Carpal tunnel syndrome 10/01/2017   Kidney stones 03/30/2014   Physical exam, annual 06/29/2013   Anxiety 06/29/2013   Borderline hypercholesterolemia 05/14/2010   DYSPEPSIA 05/14/2010   LUMBOSACRAL STRAIN 05/14/2010   Allergic rhinitis 05/15/2009   UPPER RESPIRATORY INFECTION 05/09/2009   GASTROENTERITIS 12/25/2007   IRRITABLE BOWEL SYNDROME 12/25/2007    Past Surgical History:  Procedure Laterality Date   CARPAL TUNNEL RELEASE Right    CESAREAN SECTION     x 2     OB History    No obstetric history on file.      Home Medications    Prior to Admission medications   Medication Sig Start Date End Date Taking? Authorizing Provider  albuterol (VENTOLIN HFA) 108 (90 Base) MCG/ACT inhaler Inhale 1-2 puffs into the lungs every 6 (six) hours as needed for wheezing or shortness of breath. 12/14/18   Bast, Amamda A, NP  ibuprofen (ADVIL,MOTRIN) 200 MG tablet Take 200 mg by mouth every 6 (six) hours as needed.    [provider]  Multiple Vitamin (MULTIVITAMIN) capsule Take 1 capsule by mouth as needed.     [provider]    Family History Family History  Problem Relation Age of Onset   Diabetes Mother    Heart disease Mother    Leukemia Father    Stomach cancer Paternal Grandfather    Kidney disease Paternal Aunt     Social History Social History   Tobacco Use   Smoking status: Never Smoker   Smokeless tobacco: Never Used   Tobacco comment: exposed to second hand smoke  Substance Use Topics   Alcohol use: Yes    Comment: social use   Drug use: No     Allergies   Penicillins   Review of Systems Review of Systems  Constitutional: Negative for fever.  Respiratory: Negative for cough and shortness of breath.   Cardiovascular: Positive for chest pain.  Gastrointestinal: Positive for nausea. Negative for abdominal pain and vomiting.  Genitourinary: Negative for dysuria and hematuria.  Neurological: Positive for numbness and headaches. Negative for weakness.  All other systems reviewed and are negative.    Physical Exam Updated Vital Signs BP 115/71 (BP Location: Right Arm)    Pulse 74    Temp 98.7 F (37.1 C) (Oral)    Resp 18    Ht 5' (1.524 m)    Wt 50.8 kg    SpO2 99%    BMI 21.87 kg/m   Physical Exam Vitals signs and nursing note reviewed.  Constitutional:      Appearance: Normal appearance. She is well-developed.  HENT:     Head: Normocephalic and atraumatic.  Eyes:     General: Lids are normal.      Conjunctiva/sclera: Conjunctivae normal.     Pupils: Pupils are equal, round, and reactive to light.     Comments: PERRL.  No nystagmus, neglect.  Neck:     Musculoskeletal: Full passive range of motion without pain.  Cardiovascular:     Rate and Rhythm: Normal rate and regular rhythm.     Pulses: Normal pulses.          Radial pulses are 2+ on the right side and 2+ on the left side.       Dorsalis pedis pulses are 2+ on the right side and 2+ on the left side.     Heart sounds: Normal heart sounds.  No murmur. No friction rub. No gallop.   Pulmonary:     Effort: Pulmonary effort is normal.     Breath sounds: Normal breath sounds.     Comments: Lungs clear to auscultation bilaterally.  Symmetric chest rise.  No wheezing, rales, rhonchi. Abdominal:     Palpations: Abdomen is soft. Abdomen is not rigid.     Tenderness: There is no abdominal tenderness. There is no guarding.     Comments: Abdomen is soft, non-distended, non-tender. No rigidity, No guarding. No peritoneal signs. No CVA tenderness bilaterally.  Musculoskeletal: Normal range of motion.  Skin:    General: Skin is warm and dry.     Capillary Refill: Capillary refill takes less than 2 seconds.  Neurological:     Mental Status: She is alert and oriented to person, place, and time.     Comments: Cranial nerves III-XII intact Follows commands, Moves all extremities  5/5 strength to BUE and BLE  Patient reports tingling sensation to left upper extremity with assessment of sensation.  Otherwise sensation intact throughout all major nerve distributions Normal coordination No pronator drift. No gait abnormalities  No slurred speech. No facial droop.   Psychiatric:        Speech: Speech normal.      ED Treatments / Results  Labs (all labs ordered are listed, but only abnormal results are displayed) Labs Reviewed  COMPREHENSIVE METABOLIC PANEL - Abnormal; Notable for the following components:      Result Value   Glucose, Bld  106 (*)    Total Protein 6.3 (*)    Alkaline Phosphatase 33 (*)    All other components within normal limits  CBC WITH DIFFERENTIAL/PLATELET - Abnormal; Notable for the following components:   RDW 11.3 (*)    All other components within normal limits  URINALYSIS, ROUTINE W REFLEX MICROSCOPIC - Abnormal; Notable for the following components:   Color, Urine STRAW (*)    All other components within normal limits  LIPASE, BLOOD  D-DIMER, QUANTITATIVE (NOT AT [facility])  I-STAT BETA HCG BLOOD, ED ([facility], [facility], [facility] ONLY)  TROPONIN I (HIGH SENSITIVITY)    EKG None  Radiology Dg Chest 2 View  Result Date: 03/21/2019 CLINICAL DATA:  Chest pain EXAM: CHEST - 2 VIEW COMPARISON:  12/14/2018. FINDINGS: Mediastinum hilar structures normal. Heart size normal. Lungs are clear. No pleural effusion or pneumothorax. No acute bony abnormality. Thoracic spine scoliosis. IMPRESSION: No acute cardiopulmonary disease. Electronically Signed   By: Maisie Fushomas  Register   On: 03/21/2019 14:11   Ct Head Wo Contrast  Result Date: 03/21/2019 CLINICAL DATA:  Acute headache with nausea this morning, no injury EXAM: CT HEAD WITHOUT CONTRAST TECHNIQUE: Contiguous axial images were obtained from the base of the skull through the vertex without intravenous contrast. Sagittal and coronal MPR images reconstructed from axial data set. COMPARISON:  None FINDINGS: Brain: Normal ventricular morphology. No midline shift or mass effect. Normal appearance of brain parenchyma. No intracranial hemorrhage, mass lesion or evidence acute infarction. No extra-axial fluid collections. Vascular: No hyperdense vessels. Skull: Intact Sinuses/Orbits: Visualized paranasal sinuses and mastoid air cells clear Other: N/A IMPRESSION: Normal exam. Electronically Signed   By: Ulyses SouthwardMark  Boles M.D.   On: 03/21/2019 13:57   Mr Brain Wo Contrast  Result Date: 03/21/2019 CLINICAL DATA:  Initial evaluation for acute headache with left-sided tingling. EXAM: MRI HEAD WITHOUT  CONTRAST TECHNIQUE: Multiplanar, multiecho pulse sequences of the brain and surrounding structures were obtained without intravenous contrast. COMPARISON:  Prior head CT  from earlier the same day. FINDINGS: Brain: Cerebral volume within normal limits for patient age. No focal parenchymal signal abnormality identified. No abnormal foci of restricted diffusion to suggest acute or subacute ischemia. Gray-white matter differentiation well maintained. No encephalomalacia to suggest chronic infarction. No foci of susceptibility artifact to suggest acute or chronic intracranial hemorrhage. No mass lesion, midline shift or mass effect. No hydrocephalus. No extra-axial fluid collection. Major dural sinuses are grossly patent. Pituitary gland and suprasellar region are normal. Midline structures intact and normal. Vascular: Major intracranial vascular flow voids well maintained and normal in appearance. Skull and upper cervical spine: Craniocervical junction normal. Visualized upper cervical spine within normal limits. Bone marrow signal intensity normal. No scalp soft tissue abnormality. Sinuses/Orbits: Globes and orbital soft tissues within normal limits. Paranasal sinuses are clear. No mastoid effusion. Inner ear structures normal. Other: None. IMPRESSION: Normal brain MRI.  No acute intracranial abnormality identified. Electronically Signed   By: Rise MuBenjamin  McClintock M.D.   On: 03/21/2019 19:19    Procedures Procedures (including critical care time)  Medications Ordered in ED Medications  sodium chloride 0.9 % bolus 1,000 mL (0 mLs Intravenous Stopped 03/21/19 1800)  metoCLOPramide (REGLAN) injection 10 mg (10 mg Intravenous Given 03/21/19 1608)  diphenhydrAMINE (BENADRYL) injection 25 mg (25 mg Intravenous Given 03/21/19 1608)     Initial Impression / Assessment and Plan / ED Course  I have reviewed the triage vital signs and the nursing notes.  Pertinent labs & imaging results that were available during my  care of the patient were reviewed by me and considered in my medical decision making (see chart for details).        48 year old female who comes in for evaluation of chest pain, headache, left upper extremity tingling/numbness sensation.  Her chest pain is been an intermittent issue since March 2020.  She has been evaluated by PCP for this before.  Patient states that she had been casted for COVID and was negative.  She reports that she was treated with Levaquin, steroids.  She reports that since then, she has continued to have intermittent chest pain.  She reports that over the last 10 days, chest pain is been constant and is never gone away.  Worse when she lays down.  Worse with deep inspiration.  Not worse with sitting up or with exertion.  Also reports headache.  She states that she started having some left upper extremity tingling/numbness sensation today.  No weakness noted.  No neuro deficits noted.  Her story is atypical for ACS etiology but is also consideration.  Her history/physical exam does not sound concerning for pericarditis, myocarditis.  Do not suspect aortic dissection given history/physical exam, reassuring exam here in the ED, equal pulses in all 4 extremities, reassuring vitals.  Additionally, low suspicion for PE as she does not have any PE risk factors but she does have a pleuritic component to her chest pain.  Additionally, doubt CVA given her exam and description of her symptoms.  She has no neuro deficits on my exam.  History/physical exam concerning for meningitis, dural venous thrombosis, carotid artery dissection.  Consider musculoskeletal pain versus radiculopathy versus complex migraine as a source of her symptoms.  Will plan for labs, chest x-ray, CT head and reassess.  Troponin is negative.  Lipase unremarkable.  D-dimer is negative.  CBC shows no evidence of leukocytosis or anemia.  CMP is unremarkable.  I-STAT beta negative.  Chest x-ray shows no evidence of  cardiomegaly, infectious etiology.  Head  CT is normal.  Urine negative for any acute abnormality.  Her chest pain has been constant for 1 week and is atypical for ACS etiology.  She does have a heart score of 2 given her age and history of hypercholesteremia.  Given that this is been constant for 1 week and her low heart score, feel that one troponin is sufficient for ACS rule out.  Discussed results with patient.  She is still complaining of headache.  Plan for migraine cocktail.  I discussed treatment options with patient and engaged in shared decision making.  I discussed regarding further work-up with MRI.  My suspicion for acute CVA as the source of her symptoms is very low.  We discussed risk for benefits.  Patient feel more comfortable getting an MRI here in the ED.  Plan for migraine cocktail.  Migraine cocktail with some improvement of headache.  MRI reviewed.  Negative for any acute abnormality.  No evidence of CVA.  Discussed results with patient.  Vital signs are stable.  Discussed with patient regarding follow-up with her primary care doctor.  I did discuss with patient regarding COVID testing.  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  She had no exposure to the wife and has not had any symptoms.  I did offer cover testing but patient declined. At this time, patient exhibits no emergent life-threatening condition that require further evaluation in ED or admission. Discussed patient with Dr. Madilyn Hook who is agreeable to plan. Patient had ample opportunity for questions and discussion. All patient's questions were answered with full understanding. Strict return precautions discussed. Patient expresses understanding and agreement to plan.   Sevin Leaman was evaluated in Emergency Department on 03/21/2019 for the symptoms described in the history of present illness. She was evaluated in the context of the global COVID-19 pandemic, which necessitated  consideration that the patient might be at risk for infection with the SARS-CoV-2 virus that causes COVID-19. Institutional protocols and algorithms that pertain to the evaluation of patients at risk for COVID-19 are in a state of rapid change based on information released by regulatory bodies including the CDC and federal and state organizations. These policies and algorithms were followed during the patient's care in the ED.  Portions of this note were generated with Scientist, clinical (histocompatibility and immunogenetics). Dictation errors may occur despite best attempts at proofreading.   Final Clinical Impressions(s) / ED Diagnoses   Final diagnoses:  Nonspecific chest pain  Generalized headache    ED Discharge Orders    None       Rosana Hoes 03/21/19 2035    Tilden Fossa, MD 03/28/19 1446

## 2019-03-21 NOTE — ED Notes (Signed)
This RN called MRI, pt is next ED pt to go to MRI.

## 2019-03-21 NOTE — ED Notes (Signed)
UA and urine culture sent.

## 2019-03-21 NOTE — ED Triage Notes (Signed)
Pt. Stated, Im also having some tingling in my left arm.

## 2019-06-13 ENCOUNTER — Ambulatory Visit: Payer: No Typology Code available for payment source | Admitting: Internal Medicine

## 2019-09-01 ENCOUNTER — Encounter: Payer: Self-pay | Admitting: Cardiology

## 2019-10-08 ENCOUNTER — Encounter: Payer: Self-pay | Admitting: Cardiology

## 2019-10-10 ENCOUNTER — Encounter: Payer: Self-pay | Admitting: Cardiology

## 2019-10-19 ENCOUNTER — Encounter (HOSPITAL_COMMUNITY): Payer: Self-pay

## 2019-10-19 ENCOUNTER — Emergency Department (HOSPITAL_COMMUNITY): Payer: No Typology Code available for payment source

## 2019-10-19 ENCOUNTER — Encounter (HOSPITAL_COMMUNITY): Payer: Self-pay | Admitting: Emergency Medicine

## 2019-10-19 ENCOUNTER — Ambulatory Visit (HOSPITAL_COMMUNITY)
Admission: EM | Admit: 2019-10-19 | Discharge: 2019-10-19 | Disposition: A | Discharge status: Other Acute Inpatient Hospital | Payer: No Typology Code available for payment source | Source: Home / Self Care | Attending: Emergency Medicine | Admitting: Emergency Medicine

## 2019-10-19 ENCOUNTER — Other Ambulatory Visit: Payer: Self-pay

## 2019-10-19 ENCOUNTER — Emergency Department (HOSPITAL_COMMUNITY)
Admission: EM | Admit: 2019-10-19 | Discharge: 2019-10-20 | Disposition: A | Discharge status: Home / Self Care | Payer: No Typology Code available for payment source | Attending: Emergency Medicine | Admitting: Emergency Medicine

## 2019-10-19 DIAGNOSIS — R0789 Other chest pain: Secondary | ICD-10-CM | POA: Insufficient documentation

## 2019-10-19 DIAGNOSIS — R439 Unspecified disturbances of smell and taste: Secondary | ICD-10-CM | POA: Insufficient documentation

## 2019-10-19 DIAGNOSIS — Z880 Allergy status to penicillin: Secondary | ICD-10-CM | POA: Insufficient documentation

## 2019-10-19 DIAGNOSIS — M069 Rheumatoid arthritis, unspecified: Secondary | ICD-10-CM | POA: Diagnosis not present

## 2019-10-19 DIAGNOSIS — R509 Fever, unspecified: Secondary | ICD-10-CM | POA: Diagnosis not present

## 2019-10-19 DIAGNOSIS — Z8616 Personal history of COVID-19: Secondary | ICD-10-CM | POA: Insufficient documentation

## 2019-10-19 DIAGNOSIS — R079 Chest pain, unspecified: Secondary | ICD-10-CM

## 2019-10-19 DIAGNOSIS — R0989 Other specified symptoms and signs involving the circulatory and respiratory systems: Secondary | ICD-10-CM | POA: Insufficient documentation

## 2019-10-19 DIAGNOSIS — J189 Pneumonia, unspecified organism: Secondary | ICD-10-CM | POA: Insufficient documentation

## 2019-10-19 DIAGNOSIS — R05 Cough: Secondary | ICD-10-CM | POA: Insufficient documentation

## 2019-10-19 HISTORY — DX: COVID-19: U07.1

## 2019-10-19 LAB — I-STAT BETA HCG BLOOD, ED (MC, WL, AP ONLY): I-stat hCG, quantitative: <5 m[IU]/mL (ref <5)

## 2019-10-19 LAB — CBC
HCT: 39.8 % (ref 36.0–46.0)
Hemoglobin: 13.5 g/dL (ref 12.0–15.0)
MCH: 32.8 pg (ref 26.0–34.0)
MCHC: 33.9 g/dL (ref 30.0–36.0)
MCV: 96.6 fL (ref 80.0–100.0)
Platelets: 212 10*3/uL (ref 150–400)
RBC: 4.12 MIL/uL (ref 3.87–5.11)
RDW: 12.3 % (ref 11.5–15.5)
WBC: 10.4 10*3/uL (ref 4.0–10.5)
nRBC: 0 % (ref 0.0–0.2)

## 2019-10-19 LAB — TROPONIN I (HIGH SENSITIVITY)
Troponin I (High Sensitivity): 3 ng/L (ref <18)
Troponin I (High Sensitivity): 4 ng/L (ref <18)

## 2019-10-19 LAB — BASIC METABOLIC PANEL
Anion gap: 12 (ref 5–15)
BUN: 8 mg/dL (ref 6–20)
CO2: 25 mmol/L (ref 22–32)
Calcium: 9.2 mg/dL (ref 8.9–10.3)
Chloride: 103 mmol/L (ref 98–111)
Creatinine, Ser: 0.9 mg/dL (ref 0.44–1.00)
GFR calc Af Amer: >60 mL/min (ref >60)
GFR calc non Af Amer: >60 mL/min (ref >60)
Glucose, Bld: 87 mg/dL (ref 70–99)
Potassium: 3.9 mmol/L (ref 3.5–5.1)
Sodium: 140 mmol/L (ref 135–145)

## 2019-10-19 LAB — PROTIME-INR
INR: 1 (ref 0.8–1.2)
Prothrombin Time: 12.7 seconds (ref 11.4–15.2)

## 2019-10-19 MED ORDER — SODIUM CHLORIDE 0.9% FLUSH: 3.0000 mL | Freq: Once | INTRAVENOUS | Status: DC

## 2019-10-19 MED ORDER — NITROGLYCERIN 0.4 MG SL SUBL: 0.4000 mg | SUBLINGUAL_TABLET | SUBLINGUAL | Status: DC | PRN

## 2019-10-19 NOTE — ED Notes (Signed)
Patient is being discharged from the Urgent Care Center and sent to the Emergency Department via wheelchair by staff. Per Dr Terrilee Croak, patient is stable but in need of higher level of care due to chest pain of unknown source. Patient is aware and verbalizes understanding of plan of care.   Vitals:   10/19/19 1851  BP: (!) 155/88  Pulse: 79  Resp: 16  Temp: 98.9 F (37.2 C)  SpO2: 98%

## 2019-10-19 NOTE — ED Triage Notes (Signed)
Patient reports central chest pain radiating to left arm onset today , mild exertional dyspnea , no emesis or diaphoresis , currently taking oral antibiotic/steroid for pneumonia , denies fever or chills .

## 2019-10-19 NOTE — ED Triage Notes (Signed)
Pt state she has a bad cough and fever . Pt states she has been a antibiotics. Pt states She has a SOB. Pt has been tested for Covid last Friday and it was negative. Pt states her xray showed signs of pneumonia.  Pt states she had Covid 09/04/2019. Pt states the Covid team came out yesterday.

## 2019-10-19 NOTE — ED Provider Notes (Signed)
HPI  SUBJECTIVE:  Glendi Boyett is a 49 y.o. female who presents with substernal chest pain described as pressure, as a "elephant crushing me. "  This started today.  It is intermittent, lasting seconds.  She reports diaphoresis with this.  She states that the pain goes up her neck and she has a "weird sensation" going down her left arm.  She reports shortness of breath, fatigue, but this has been ongoing.  She has had palpitations for the past month.  She has had nausea for the past several days and is currently taking Zofran.  No syncope.  She had Covid in January and got better, but got sick again 2 or 3 weeks ago.  She reports continued loss of sense of smell or taste, body aches, fevers T-max 101.7, nonproductive cough.  She was seen by her PMD 10 days ago had a chest x-ray which showed a pneumonia, was started on Levaquin 750 mg daily for 10 days.  She is on day number 9 out of 10 of this.  She was also started on high-dose prednisone, states that she has not feeling any better.  She states the Covid team came out yesterday to evaluate her, sent off flu swab and blood work.  She called her PMD earlier today because she was having chest pain, and the Covid team was supposed to return to do and EKG, but did not because of insurance reasons, so the patient came here.  Patient took 325 mg aspirin today.  She has been also taking Pepcid twice a day, vitamin D zinc vitamin C in addition to the prednisone and Levaquin.  There are no aggravating or alleviating factors.  She denies an exertional or positional component to her chest pain.  No belching, water brash.  She has never had chest pain like this before.  She has a past medical history of rheumatoid arthritis but discontinued these medications when she was diagnosed with Covid in January.  No history of asthma, emphysema, COPD, smoking, diabetes, hypertension, coronary disease, GERD, hypercholesterolemia, MI, obesity, PE, DVT.  Family history significant  for mother with an MI in her 10s and died with an MI at 47.  QIO:NGEXB, Thayer Jew, MD    Past Medical History:  Diagnosis Date  . Allergic rhinitis   . COVID-19   . Dyspepsia   . Hypercholesteremia   . IBS (irritable bowel syndrome)   . Lumbosacral strain     Past Surgical History:  Procedure Laterality Date  . CARPAL TUNNEL RELEASE Right   . CESAREAN SECTION     x 2    Family History  Problem Relation Age of Onset  . Diabetes Mother   . Heart disease Mother   . Leukemia Father   . Stomach cancer Paternal Grandfather   . Kidney disease Paternal Aunt     Social History   Tobacco Use  . Smoking status: Never Smoker  . Smokeless tobacco: Never Used  . Tobacco comment: exposed to second hand smoke  Substance Use Topics  . Alcohol use: Yes    Comment: social use  . Drug use: No     Current Facility-Administered Medications:  .  nitroGLYCERIN (NITROSTAT) SL tablet 0.4 mg, 0.4 mg, Sublingual, Q5 Min x 3 PRN, Melynda Ripple, MD  Current Outpatient Medications:  .  albuterol (VENTOLIN HFA) 108 (90 Base) MCG/ACT inhaler, Inhale 1-2 puffs into the lungs every 6 (six) hours as needed for wheezing or shortness of breath., Disp: 1 Inhaler, Rfl: 0 .  ibuprofen (ADVIL,MOTRIN) 200 MG tablet, Take 200 mg by mouth every 6 (six) hours as needed., Disp: , Rfl:  .  Multiple Vitamin (MULTIVITAMIN) capsule, Take 1 capsule by mouth as needed. , Disp: , Rfl:   Allergies  Allergen Reactions  . Penicillins     rash     ROS  As noted in HPI.   Physical Exam  BP (!) 155/88 (BP Location: Right Arm)   Pulse 79   Temp 98.9 F (37.2 C) (Oral)   Resp 16   Wt 47.6 kg   LMP 10/05/2019   SpO2 98%   BMI 20.51 kg/m   Constitutional: Well developed, well nourished, no acute distress Eyes:  EOMI, conjunctiva normal bilaterally HENT: Normocephalic, atraumatic,mucus membranes moist Respiratory: Normal inspiratory effort lungs clear bilaterally good air movement.  Positive anterior  and lateral chest wall tenderness  cardiovascular: Normal rate no murmurs rubs or gallops GI: nondistended skin: No rash, skin intact Musculoskeletal: Calves symmetric nontender no edema Neurologic: Alert & oriented x 3, no focal neuro deficits Psychiatric: Speech and behavior appropriate   ED Course   Medications  nitroGLYCERIN (NITROSTAT) SL tablet 0.4 mg (has no administration in time range)    Orders Placed This Encounter  Procedures  . ED EKG    Standing Status:   Standing    Number of Occurrences:   1    Order Specific Question:   Reason for Exam    Answer:   Chest Pain    No results found for this or any previous visit (from the past 24 hour(s)). No results found.  ED Clinical Impression  1. Chest pain, unspecified type      ED Assessment/Plan  No outside records available.  EKG: Sinus rhythm with single PVC.  Rate 73.  Normal axis, normal intervals.  No hypertrophy.  Biphasic T wave in V3 which is new compared to EKG from 03/21/2019, but no ST elevation.  Patient was symptomatic while EKG was obtained.  This could be bronchospasm, pneumonia, musculoskeletal chest pain, GERD however the nature of the pain especially with radiation to the left neck and arm combined with her family history is concerning for ACS.  Her EKG has no ischemic changes here, vitals are normal, feel that patient is stable to go to the ED via wheelchair.  Transferring to the ED for comprehensive work-up.  Meds ordered this encounter  Medications  . nitroGLYCERIN (NITROSTAT) SL tablet 0.4 mg    *This clinic note was created using Scientist, clinical (histocompatibility and immunogenetics). Therefore, there may be occasional mistakes despite careful proofreading.   ?    Domenick Gong, MD 10/19/19 1946

## 2019-10-20 NOTE — Discharge Instructions (Addendum)
Continue Tylenol 650 mg rotated with ibuprofen 400 mg every 4 hours as needed for pain or fever.  Follow-up with your primary doctor if not improving in the next 3 to 4 days.  Return to the ER if your symptoms significantly worsen or change.

## 2019-10-20 NOTE — ED Provider Notes (Signed)
[facility] EMERGENCY DEPARTMENT Provider Note   CSN: [id number] Arrival date & time: 10/19/19  1955     History Chief Complaint  Patient presents with  . Chest Pain    Natalie Ford is a 49 y.o. female.  Patient is a 49 year old female with past medical history of rheumatoid, irritable bowel, and recent diagnosis of COVID-19.  This was diagnosed in early January.  Since that time, she has been experiencing continued chest congestion, intermittent fevers, and cough.  She is now finishing a course of Levaquin, however this does not seem to be helping.  She was seen at urgent care today, then referred here for further work-up.  She does describe some discomfort in the center of her chest along with numbness of her left arm.    The history is provided by the patient.  Chest Pain Pain location:  Substernal area Pain quality: tightness   Pain radiates to:  L arm Pain severity:  Moderate Onset quality:  Sudden Timing:  Intermittent Progression:  Improving Chronicity:  New Relieved by:  Nothing Worsened by:  Nothing Ineffective treatments:  None tried      Past Medical History:  Diagnosis Date  . Allergic rhinitis   . COVID-19   . Dyspepsia   . Hypercholesteremia   . IBS (irritable bowel syndrome)   . Lumbosacral strain     Patient Active Problem List   Diagnosis Date Noted  . Shoulder impingement syndrome, left 06/22/2018  . Left leg pain 11/18/2017  . Carpal tunnel syndrome 10/01/2017  . Kidney stones 03/30/2014  . Physical exam, annual 06/29/2013  . Anxiety 06/29/2013  . Borderline hypercholesterolemia 05/14/2010  . DYSPEPSIA 05/14/2010  . LUMBOSACRAL STRAIN 05/14/2010  . Allergic rhinitis 05/15/2009  . UPPER RESPIRATORY INFECTION 05/09/2009  . GASTROENTERITIS 12/25/2007  . IRRITABLE BOWEL SYNDROME 12/25/2007    Past Surgical History:  Procedure Laterality Date  . CARPAL TUNNEL RELEASE Right   . CESAREAN SECTION     x 2     OB  History   No obstetric history on file.     Family History  Problem Relation Age of Onset  . Diabetes Mother   . Heart disease Mother   . Leukemia Father   . Stomach cancer Paternal Grandfather   . Kidney disease Paternal Aunt     Social History   Tobacco Use  . Smoking status: Never Smoker  . Smokeless tobacco: Never Used  . Tobacco comment: exposed to second hand smoke  Substance Use Topics  . Alcohol use: Yes    Comment: social use  . Drug use: No    Home Medications Prior to Admission medications   Medication Sig Start Date End Date Taking? Authorizing Provider  albuterol (VENTOLIN HFA) 108 (90 Base) MCG/ACT inhaler Inhale 1-2 puffs into the lungs every 6 (six) hours as needed for wheezing or shortness of breath. 12/14/18   Bast, Graysen A, NP  ibuprofen (ADVIL,MOTRIN) 200 MG tablet Take 200 mg by mouth every 6 (six) hours as needed.    [provider]  Multiple Vitamin (MULTIVITAMIN) capsule Take 1 capsule by mouth as needed.     [provider]    Allergies    Penicillins  Review of Systems   Review of Systems  Cardiovascular: Positive for chest pain.  All other systems reviewed and are negative.   Physical Exam Updated Vital Signs BP 128/85 (BP Location: Left Arm)   Pulse 72   Temp 98.5 F (36.9 C) (Oral)  Resp (!) 22   Ht 5' (1.524 m)   Wt 50 kg   LMP 10/10/2019   SpO2 96%   BMI 21.53 kg/m   Physical Exam Vitals and nursing note reviewed.  Constitutional:      General: She is not in acute distress.    Appearance: She is well-developed. She is not diaphoretic.  HENT:     Head: Normocephalic and atraumatic.  Cardiovascular:     Rate and Rhythm: Normal rate and regular rhythm.     Heart sounds: No murmur. No friction rub. No gallop.   Pulmonary:     Effort: Pulmonary effort is normal. No respiratory distress.     Breath sounds: Normal breath sounds. No wheezing.  Abdominal:     General: Bowel sounds are normal. There is no  distension.     Palpations: Abdomen is soft.     Tenderness: There is no abdominal tenderness.  Musculoskeletal:        General: Normal range of motion.     Cervical back: Normal range of motion and neck supple.     Right lower leg: No tenderness. No edema.     Left lower leg: No tenderness. No edema.  Skin:    General: Skin is warm and dry.  Neurological:     Mental Status: She is alert and oriented to person, place, and time.     ED Results / Procedures / Treatments   Labs (all labs ordered are listed, but only abnormal results are displayed) Labs Reviewed  BASIC METABOLIC PANEL  CBC  PROTIME-INR  I-STAT BETA HCG BLOOD, ED ([facility], [facility], [facility] ONLY)  TROPONIN I (HIGH SENSITIVITY)  TROPONIN I (HIGH SENSITIVITY)    EKG EKG Interpretation  Date/Time:  Wednesday October 19 2019 20:00:51 EST Ventricular Rate:  74 PR Interval:  118 QRS Duration: 76 QT Interval:  368 QTC Calculation: 408 R Axis:   19 Text Interpretation: Normal sinus rhythm Normal ECG Confirmed by Geoffery Lyons ([id number]) on 10/20/2019 1:05:32 AM   Radiology DG Chest 2 View  Result Date: 10/19/2019 CLINICAL DATA:  Chest pain and shortness of breath for 9 days. History of COVID-19. EXAM: CHEST - 2 VIEW COMPARISON:  10/10/2019 FINDINGS: The cardiomediastinal silhouette is within normal limits. The lungs are well inflated. There is likely minimal scarring in the left lung base. No confluent airspace opacity, edema, pleural effusion, pneumothorax is identified. No acute osseous abnormality is seen. IMPRESSION: No evidence of acute cardiopulmonary disease. Electronically Signed   By: Sebastian Ache M.D.   On: 10/19/2019 20:57    Procedures Procedures (including critical care time)  Medications Ordered in ED Medications  sodium chloride flush (NS) 0.9 % injection 3 mL (3 mLs Intravenous Not Given 10/20/19 0107)    ED Course  I have reviewed the triage vital signs and the nursing notes.  Pertinent labs & imaging results  that were available during my care of the patient were reviewed by me and considered in my medical decision making (see chart for details).    MDM Rules/Calculators/A&P  Patient with history of rheumatoid arthritis and COVID-19 in January 2021 presenting with intermittent fever, cough and chest discomfort since her diagnosis.  She is not improving with a course of Levaquin.  Patient's work-up this evening is unremarkable including laboratory studies, troponin x2, and EKG.  Her chest x-ray is clear.  I am uncertain as to the ongoing symptoms she is describing, however nothing today appears emergent.  I will have the patient follow-up  with her primary doctor if symptoms or not improving.  She is to continue symptomatic treatment and return if she worsens.  Final Clinical Impression(s) / ED Diagnoses Final diagnoses:  None    Rx / DC Orders ED Discharge Orders    None       Veryl Speak, MD 10/20/19 [phone]

## 2019-11-01 ENCOUNTER — Other Ambulatory Visit: Payer: Self-pay

## 2019-11-01 ENCOUNTER — Ambulatory Visit (INDEPENDENT_AMBULATORY_CARE_PROVIDER_SITE_OTHER): Payer: No Typology Code available for payment source | Admitting: Nurse Practitioner

## 2019-11-01 VITALS — BP 124/82 | HR 97 | Temp 99.3°F | Resp 15 | Ht 60.0 in | Wt 107.1 lb

## 2019-11-01 DIAGNOSIS — Z8616 Personal history of COVID-19: Secondary | ICD-10-CM | POA: Diagnosis not present

## 2019-11-01 DIAGNOSIS — R05 Cough: Secondary | ICD-10-CM | POA: Diagnosis not present

## 2019-11-01 DIAGNOSIS — R509 Fever, unspecified: Secondary | ICD-10-CM | POA: Diagnosis not present

## 2019-11-01 DIAGNOSIS — R059 Cough, unspecified: Secondary | ICD-10-CM | POA: Insufficient documentation

## 2019-11-01 MED ORDER — BENZONATATE 200 MG PO CAPS: 200.0000 mg | ORAL_CAPSULE | Freq: Two times a day (BID) | ORAL | 0 refills | Status: DC | PRN

## 2019-11-01 MED ORDER — AZITHROMYCIN 250 MG PO TABS: ORAL_TABLET | ORAL | 0 refills | Status: DC

## 2019-11-01 NOTE — Assessment & Plan Note (Signed)
Fever of unknown origin and Cough: Assessment: Patient diagnosed with Covid for a second time in January - recovered and then became symptomatic again mid February. Covid re-test - negative at that time. Patient continues to have fever daily ranging from 100 F - 102 F. Tylenol and motrin do not seem to help. Patient also has ongoing slight cough - recent x ray was clear.   Plan:  Patient Instructions  Will place referral to infectious disease Will start azithromycin Will start tessalon perles Stay well hydrated Hand out given on Covid nutritional rehabilitation  Please follow up with cardiology soon  Continue current medications  Cough: Continue gastroesophageal reflux disease treatment with elevating the head your bed and taking antacids Continue taking over-the-counter antihistamines and nasal fluticasone to help with allergic rhinitis You need to try to suppress your cough to allow your larynx (voice box) to heal.  For three days don't talk, laugh, sing, or clear your throat. Do everything you can to suppress the cough during this time. Use hard candies (sugarless Jolly Ranchers) or non-mint or non-menthol containing cough drops during this time to soothe your throat.  Use a cough suppressant (Delsym or what I have prescribed you) around the clock during this time.  After three days, gradually increase the use of your voice and back off on the cough suppressants.

## 2019-11-01 NOTE — Patient Instructions (Signed)
Will place referral to infectious disease Will start azithromycin Will start tessalon perles Stay well hydrated Hand out given on Covid nutritional rehabilitation  Please follow up with cardiology soon  Continue current medications  Cough: Continue gastroesophageal reflux disease treatment with elevating the head your bed and taking antacids Continue taking over-the-counter antihistamines and nasal fluticasone to help with allergic rhinitis You need to try to suppress your cough to allow your larynx (voice box) to heal.  For three days don't talk, laugh, sing, or clear your throat. Do everything you can to suppress the cough during this time. Use hard candies (sugarless Jolly Ranchers) or non-mint or non-menthol containing cough drops during this time to soothe your throat.  Use a cough suppressant (Delsym or what I have prescribed you) around the clock during this time.  After three days, gradually increase the use of your voice and back off on the cough suppressants.

## 2019-11-01 NOTE — Progress Notes (Signed)
@Patient  ID: , female    DOB: Jan 04, 1971, 49 y.o.   MRN: [id number]  Chief Complaint  Patient presents with  . Cough    dx twice with Covid symptoms got better in January for a couple of weeks then started back up  . Fever  . body aches    Referring provider: February, MD   49 year old female with allergic rhinitis, IBS, and rheumatoid arthritis.  Recent significant Encounters:   Imaging:   Chest X ray 10/19/19: No evidence of acute cardiopulmonary disease.  Timeline of illness:  March 2020 - diagnosed with Covid - Chest xray showed enlarged heart and was referred to cardiology - Cardiac work-up came back negative  June 2020 - Covid test - negative  September 2020 - Referred to rheumatology for ongoing muscle and joint pain. Diagnosed with rheumatoid arthritis - current treatment daily prednisone  January 2021 - Became sick again with Covid symptoms - Tested positive for Covid again. Started feeling better after 2 weeks.  February 2021 - Cough and fever return. Chest x ray clear. Given Levaquin and Prednisone taper by PCP (mid feb) with no relief noted. Covid re-test negative.   HPI  Patient presents today for Post Covid care visit. Patient states that she tested positive for Covid for a second time in January of this year (see above illness timeline). Her symptoms improved after 2 weeks. She became sick again mid February. She states that since that time she continues to have a slight dry cough and fevers every day ranging from 100 F - 102 F. She had ben alternating motrin and tylenol, but states that it doesn't seem to help. She states that she had lost her taste and smell, but it is slowly returning. She has had a poor appetite, but has been drinking lots of fluids to stay hydrated. She does have fatigue and headaches associated with the fevers. She has noticed that when her fever spikes (usually in the afternoon) that at times she does have increased heart  rate and palpitations. Denies n/v/d, hemoptysis, PND, leg swelling. Patient was recently diagnosed with Rheumatoid Arthritis and is on chronic prednisone at 10 mg daily for maintenance.           Allergies  Allergen Reactions  . Penicillins     rash    Immunization History  Administered Date(s) Administered  . Influenza Split 05/26/2011, 06/15/2013  . Influenza Whole 06/04/2009  . Influenza, Quadrivalent, Recombinant, Inj, Pf 06/04/2018  . Influenza,inj,Quad PF,6+ Mos 05/26/2017  . Tdap 08/25/2010    Past Medical History:  Diagnosis Date  . Allergic rhinitis   . COVID-19   . Dyspepsia   . Hypercholesteremia   . IBS (irritable bowel syndrome)   . Lumbosacral strain     Tobacco History: Social History   Tobacco Use  Smoking Status Never Smoker  Smokeless Tobacco Never Used  Tobacco Comment   exposed to second hand smoke   Counseling given: Yes Comment: exposed to second hand smoke   Outpatient Encounter Medications as of 11/01/2019  Medication Sig  . albuterol (VENTOLIN HFA) 108 (90 Base) MCG/ACT inhaler Inhale 1-2 puffs into the lungs every 6 (six) hours as needed for wheezing or shortness of breath.  01/01/2020 ibuprofen (ADVIL,MOTRIN) 200 MG tablet Take 200 mg by mouth every 6 (six) hours as needed.  . Multiple Vitamin (MULTIVITAMIN) capsule Take 1 capsule by mouth as needed.   [name] azithromycin (ZITHROMAX) 250 MG tablet Take 2 tablets (500 mg)  on day 1, then take 1 tablet (250 mg) on days 2-5  . benzonatate (TESSALON) 200 MG capsule Take 1 capsule (200 mg total) by mouth 2 (two) times daily as needed for cough.   No facility-administered encounter medications on file as of 11/01/2019.     Review of Systems  Review of Systems  Constitutional: Positive for appetite change, fatigue and fever.  HENT: Negative.   Respiratory: Positive for cough (non productive). Negative for shortness of breath and wheezing.   Cardiovascular: Positive for palpitations (occasionally ).  Negative for chest pain and leg swelling.  Gastrointestinal: Negative.   Musculoskeletal: Positive for arthralgias and myalgias.  Allergic/Immunologic: Negative.   Neurological: Positive for headaches. Negative for weakness.  Psychiatric/Behavioral: Negative.  Negative for confusion and decreased concentration.       Physical Exam  BP 124/82   Pulse 97   Temp 99.3 F (37.4 C) (Oral)   Resp 15   Ht 5' (1.524 m)   Wt 107 lb 1.9 oz (48.6 kg)   LMP 10/10/2019   SpO2 97%   BMI 20.92 kg/m   Wt Readings from Last 5 Encounters:  11/01/19 107 lb 1.9 oz (48.6 kg)  10/19/19 110 lb 3.7 oz (50 kg)  10/19/19 105 lb (47.6 kg)  03/21/19 112 lb (50.8 kg)  06/22/18 113 lb 9.6 oz (51.5 kg)     Physical Exam Vitals and nursing note reviewed.  Constitutional:      General: She is not in acute distress.    Appearance: She is well-developed. She is not ill-appearing, toxic-appearing or diaphoretic.  Cardiovascular:     Rate and Rhythm: Normal rate and regular rhythm.     Pulses: Normal pulses.     Heart sounds: Normal heart sounds.  Pulmonary:     Effort: Pulmonary effort is normal. No respiratory distress.     Breath sounds: Normal breath sounds. No wheezing or rhonchi.  Musculoskeletal:        General: No swelling.     Right lower leg: No edema.     Left lower leg: No edema.  Neurological:     Mental Status: She is alert and oriented to person, place, and time.    Imaging: DG Chest 2 View  Result Date: 10/19/2019 CLINICAL DATA:  Chest pain and shortness of breath for 9 days. History of COVID-19. EXAM: CHEST - 2 VIEW COMPARISON:  10/10/2019 FINDINGS: The cardiomediastinal silhouette is within normal limits. The lungs are well inflated. There is likely minimal scarring in the left lung base. No confluent airspace opacity, edema, pleural effusion, pneumothorax is identified. No acute osseous abnormality is seen. IMPRESSION: No evidence of acute cardiopulmonary disease. Electronically  Signed   By: Logan Bores M.D.   On: 10/19/2019 20:57     Assessment & Plan:   FUO (fever of unknown origin) Fever of unknown origin and Cough: Assessment: Patient diagnosed with Covid for a second time in January - recovered and then became symptomatic again mid February. Covid re-test - negative at that time. Patient continues to have fever daily ranging from 100 F - 102 F. Tylenol and motrin do not seem to help. Patient also has ongoing slight cough - recent x ray was clear.   Plan:  Patient Instructions  Will place referral to infectious disease Will start azithromycin Will start tessalon perles Stay well hydrated Hand out given on Covid nutritional rehabilitation  Please follow up with cardiology soon  Continue current medications  Cough: Continue gastroesophageal reflux disease treatment  with elevating the head your bed and taking antacids Continue taking over-the-counter antihistamines and nasal fluticasone to help with allergic rhinitis You need to try to suppress your cough to allow your larynx (voice box) to heal.  For three days don't talk, laugh, sing, or clear your throat. Do everything you can to suppress the cough during this time. Use hard candies (sugarless Jolly Ranchers) or non-mint or non-menthol containing cough drops during this time to soothe your throat.  Use a cough suppressant (Delsym or what I have prescribed you) around the clock during this time.  After three days, gradually increase the use of your voice and back off on the cough suppressants.           Ivonne Andrew, NP 11/01/2019

## 2019-11-15 ENCOUNTER — Ambulatory Visit (INDEPENDENT_AMBULATORY_CARE_PROVIDER_SITE_OTHER): Payer: No Typology Code available for payment source | Admitting: Internal Medicine

## 2019-11-15 ENCOUNTER — Other Ambulatory Visit: Payer: Self-pay

## 2019-11-15 VITALS — Wt 105.0 lb

## 2019-11-15 DIAGNOSIS — G9349 Other encephalopathy: Secondary | ICD-10-CM

## 2019-11-15 DIAGNOSIS — U071 COVID-19: Secondary | ICD-10-CM | POA: Diagnosis not present

## 2019-11-15 DIAGNOSIS — R509 Fever, unspecified: Secondary | ICD-10-CM | POA: Diagnosis not present

## 2019-11-15 DIAGNOSIS — R0600 Dyspnea, unspecified: Secondary | ICD-10-CM | POA: Diagnosis not present

## 2019-11-15 DIAGNOSIS — R0609 Other forms of dyspnea: Secondary | ICD-10-CM

## 2019-11-15 DIAGNOSIS — R1084 Generalized abdominal pain: Secondary | ICD-10-CM

## 2019-11-15 NOTE — Progress Notes (Signed)
RFV: FUO  Patient ID: Natalie Ford, female   DOB: [birth year], 49 y.o.   MRN: [id number]  HPI 49yo F who was diagnosed with covid in march 2020 still felt poorly after infection and started to notice various constellation of symptoms of myalgia, arthralgias, shortness of breath and palpitations. Her PCP started to refer to appropriate specialist  Saw rheumatology because of sequelae of fatigue, and body aches, left knee . Diagnosed with seronegative rheumatoid arthritis Now on pred 10mg - since late sept/early October. Did a trial of mtx but did not tolerate. Then did leflunomide.  She was exposed by co-workers for covid, then developed lost taste and smell, sore throat, cough, and fever and different myalgias - then got tested which was PCR positive jan 8th - suspected this was a re-infection. She was Sick for 3 weeks. Stopped leflunomide at this time.  Beginning of February felt improved thus restarted leflunomide, plus prednisone 20mg .  But then on feb 14th - had fever, 101.83F, coughing. Repeated PCR testing for covid which was negative CXR was not concerning for infiltrate per my review, but still treated for atypical infection started on levofloxacin by dr Shelia Media. Plus prolonged prednisone  Still not feeling poorly with fever, and shortness of breath -called back on 2/22 to dr pharr. covid home health team saw her at home  Seen back in the respiratory clinic on 3/09 - where she was given another trial of abtx - given azithromycin on 11/01/19. Some improvement with cough  Back down to her maintenance 10mg  prednisone.  Now has palpatations in march 2020, but again in jan 2021, with re-infection. Now noticing ongoing palpatations. Has referral to cardiology.   Checks her temps several time a day, oral 100.2-100.5 in AM. At 2pm 101.83F - feels worse at this peak. - since feb 13th.   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  Outpatient Encounter Medications as of 11/15/2019  Medication  Sig  . albuterol (VENTOLIN HFA) 108 (90 Base) MCG/ACT inhaler Inhale 1-2 puffs into the lungs every 6 (six) hours as needed for wheezing or shortness of breath.  [name] azithromycin (ZITHROMAX) 250 MG tablet Take 2 tablets (500 mg) on day 1, then take 1 tablet (250 mg) on days 2-5 (Patient not taking: Reported on 11/15/2019)  . benzonatate (TESSALON) 200 MG capsule Take 1 capsule (200 mg total) by mouth 2 (two) times daily as needed for cough.  [name] buPROPion (WELLBUTRIN XL) 150 MG 24 hr tablet bupropion HCl XL 150 mg 24 hr tablet, extended release  TAKE 3 TABLETS BY MOUTH IN THE MORNING  . ibuprofen (ADVIL,MOTRIN) 200 MG tablet Take 200 mg by mouth every 6 (six) hours as needed.  . Multiple Vitamin (MULTIVITAMIN) capsule Take 1 capsule by mouth as needed.    No facility-administered encounter medications on file as of 11/15/2019.     Patient Active Problem List   Diagnosis Date Noted  . History of COVID-19 11/01/2019  . FUO (fever of unknown origin) 11/01/2019  . Cough 11/01/2019  . Shoulder impingement syndrome, left 06/22/2018  . Left leg pain 11/18/2017  . Carpal tunnel syndrome 10/01/2017  . Kidney stones 03/30/2014  . Physical exam, annual 06/29/2013  . Anxiety 06/29/2013  . Borderline hypercholesterolemia 05/14/2010  . DYSPEPSIA 05/14/2010  . LUMBOSACRAL STRAIN 05/14/2010  . Allergic rhinitis 05/15/2009  . UPPER RESPIRATORY INFECTION 05/09/2009  . GASTROENTERITIS 12/25/2007  . IRRITABLE BOWEL SYNDROME 12/25/2007     Health Maintenance Due  Topic Date Due  .  HIV Screening  Never done  . PAP SMEAR-Modifier  04/25/2016  . INFLUENZA VACCINE  03/26/2019    Social History   Tobacco Use  . Smoking status: Never Smoker  . Smokeless tobacco: Never Used  . Tobacco comment: exposed to second hand smoke  Substance Use Topics  . Alcohol use: Yes    Comment: social use  . Drug use: No    Review of Systems  Constitutional: Negative for + fever, chills, decrease activity change,+   appetite change,+  fatigue and unexpected weight change (#8 down since jan) HENT: Negative for congestion, sore throat, rhinorrhea, sneezing, trouble swallowing and sinus pressure.  Eyes: Negative for photophobia and visual disturbance.  Respiratory: +DOE, Negative for cough, chest tightness, wheezing and stridor.  Cardiovascular:+ palpation Negative for chest pain, palpitations and leg swelling.  Gastrointestinal: + right side flank pain- intermittent. Negative for nausea, vomiting, abdominal pain, diarrhea, constipation, blood in stool, abdominal distention and anal bleeding.  Genitourinary: Negative for dysuria, hematuria, flank pain and difficulty urinating.  Musculoskeletal: + myalgias, neg back pain, joint swelling, arthralgias and gait problem.  Skin: Negative for color change, pallor, rash and wound.  Neurological: Negative for dizziness, tremors, weakness and light-headedness. Still significant loss of smell Hematological: Negative for adenopathy. Does not bruise/bleed easily.  Psychiatric/Behavioral: +brain fog  Physical Exam   Wt 105 lb (47.6 kg)   BMI 20.51 kg/m   Physical Exam  Constitutional:  oriented to person, place, and time. appears well-developed and well-nourished. No distress.  HENT: [place]/AT, PERRLA, no scleral icterus Mouth/Throat: Oropharynx is clear and moist. No oropharyngeal exudate.  Cardiovascular: Normal rate, regular rhythm and normal heart sounds. Exam reveals no gallop and no friction rub.  No murmur heard.  Pulmonary/Chest: Effort normal and breath sounds normal. No respiratory distress.  has no wheezes.  Neck = supple, no nuchal rigidity Lymphadenopathy: no cervical adenopathy. No axillary adenopathy Neurological: alert and oriented to person, place, and time.  Skin: Skin is warm and dry. No rash noted. No erythema.  Psychiatric: a normal mood and affect.  behavior is normal.   CBC Lab Results  Component Value Date   WBC 10.4 10/19/2019   RBC 4.12  10/19/2019   HGB 13.5 10/19/2019   HCT 39.8 10/19/2019   PLT 212 10/19/2019   MCV 96.6 10/19/2019   MCH 32.8 10/19/2019   MCHC 33.9 10/19/2019   RDW 12.3 10/19/2019   LYMPHSABS 1.2 03/21/2019   MONOABS 0.4 03/21/2019   EOSABS 0.0 03/21/2019    BMET Lab Results  Component Value Date   NA 140 10/19/2019   K 3.9 10/19/2019   CL 103 10/19/2019   CO2 25 10/19/2019   GLUCOSE 87 10/19/2019   BUN 8 10/19/2019   CREATININE 0.90 10/19/2019   CALCIUM 9.2 10/19/2019   GFRNONAA >60 10/19/2019   GFRAA >60 10/19/2019    Assessment and Plan  Recovered covid, with long haulers syndrome, will need to rule out other causes of FUO FUO = ? 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 Will defer to  cardiologist if makes sense to do cardiac mri

## 2019-11-18 ENCOUNTER — Telehealth: Payer: Self-pay

## 2019-11-18 NOTE — Telephone Encounter (Signed)
NOTES ON FILE FROM [facility] [phone], REFERRAL SENT TO SCHEDULING 

## 2019-11-21 LAB — CBC WITH DIFFERENTIAL/PLATELET
Absolute Monocytes: 461 {cells}/uL (ref 200–950)
Basophils Absolute: 26 {cells}/uL (ref 0–200)
Basophils Relative: 0.3 %
Eosinophils Absolute: 9 {cells}/uL — ABNORMAL LOW (ref 15–500)
Eosinophils Relative: 0.1 %
HCT: 40.7 % (ref 35.0–45.0)
Hemoglobin: 14.1 g/dL (ref 11.7–15.5)
Lymphs Abs: 922 {cells}/uL (ref 850–3900)
MCH: 33.3 pg — ABNORMAL HIGH (ref 27.0–33.0)
MCHC: 34.6 g/dL (ref 32.0–36.0)
MCV: 96 fL (ref 80.0–100.0)
MPV: 9.7 fL (ref 7.5–12.5)
Monocytes Relative: 5.3 %
Neutro Abs: 7282 {cells}/uL (ref 1500–7800)
Neutrophils Relative %: 83.7 %
Platelets: 280 Thousand/uL (ref 140–400)
RBC: 4.24 Million/uL (ref 3.80–5.10)
RDW: 11.3 % (ref 11.0–15.0)
Total Lymphocyte: 10.6 %
WBC: 8.7 Thousand/uL (ref 3.8–10.8)

## 2019-11-21 LAB — CULTURE, BLOOD (SINGLE)
MICRO NUMBER:: 10292016
MICRO NUMBER:: 10287323
Result:: NO GROWTH
Result:: NO GROWTH
SPECIMEN QUALITY:: ADEQUATE
SPECIMEN QUALITY:: ADEQUATE

## 2019-11-21 LAB — COMPLETE METABOLIC PANEL WITH GFR
AG Ratio: 2.5 (calc) (ref 1.0–2.5)
ALT: 14 U/L (ref 6–29)
AST: 14 U/L (ref 10–35)
Albumin: 4.8 g/dL (ref 3.6–5.1)
Alkaline phosphatase (APISO): 39 U/L (ref 31–125)
BUN: 11 mg/dL (ref 7–25)
CO2: 26 mmol/L (ref 20–32)
Calcium: 9.6 mg/dL (ref 8.6–10.2)
Chloride: 105 mmol/L (ref 98–110)
Creat: 0.87 mg/dL (ref 0.50–1.10)
GFR, Est African American: 91 mL/min/{1.73_m2} (ref >=60)
GFR, Est Non African American: 79 mL/min/{1.73_m2} (ref >=60)
Globulin: 1.9 g/dL (calc) (ref 1.9–3.7)
Glucose, Bld: 103 mg/dL — ABNORMAL HIGH (ref 65–99)
Potassium: 3.9 mmol/L (ref 3.5–5.3)
Sodium: 140 mmol/L (ref 135–146)
Total Bilirubin: 0.4 mg/dL (ref 0.2–1.2)
Total Protein: 6.7 g/dL (ref 6.1–8.1)

## 2019-11-21 LAB — SEDIMENTATION RATE: Sed Rate: 2 mm/h (ref 0–20)

## 2019-11-21 LAB — C-REACTIVE PROTEIN: CRP: <0.2 mg/L (ref <8.0)

## 2019-11-21 LAB — FERRITIN: Ferritin: 80 ng/mL (ref 16–232)

## 2019-11-21 LAB — IGG, IGA, IGM
IgG (Immunoglobin G), Serum: 659 mg/dL (ref 600–1640)
IgM, Serum: 51 mg/dL (ref 50–300)
Immunoglobulin A: 115 mg/dL (ref 47–310)

## 2019-11-21 LAB — IGE: IgE (Immunoglobulin E), Serum: <2 kU/L

## 2019-11-21 LAB — HIV ANTIBODY (ROUTINE TESTING W REFLEX): HIV 1&2 Ab, 4th Generation: NONREACTIVE

## 2019-11-22 ENCOUNTER — Ambulatory Visit (HOSPITAL_COMMUNITY)
Admission: RE | Admit: 2019-11-22 | Discharge: 2019-11-22 | Disposition: A | Discharge status: Home / Self Care | Payer: No Typology Code available for payment source | Source: Ambulatory Visit | Attending: Internal Medicine | Admitting: Internal Medicine

## 2019-11-22 ENCOUNTER — Ambulatory Visit: Payer: Self-pay | Admitting: Cardiology

## 2019-11-22 ENCOUNTER — Other Ambulatory Visit: Payer: Self-pay

## 2019-11-22 DIAGNOSIS — R1084 Generalized abdominal pain: Secondary | ICD-10-CM

## 2019-11-22 MED ORDER — IOHEXOL 300 MG/ML  SOLN
100.0000 mL | Freq: Once | INTRAMUSCULAR | Status: AC | PRN
  Administered 2019-11-22: 100 mL via INTRAVENOUS

## 2019-12-07 ENCOUNTER — Encounter: Payer: Self-pay | Admitting: Internal Medicine

## 2019-12-07 ENCOUNTER — Institutional Professional Consult (permissible substitution): Payer: No Typology Code available for payment source | Admitting: Critical Care Medicine

## 2019-12-07 ENCOUNTER — Other Ambulatory Visit: Payer: Self-pay

## 2019-12-07 ENCOUNTER — Ambulatory Visit: Payer: No Typology Code available for payment source | Admitting: Internal Medicine

## 2019-12-07 VITALS — BP 124/81 | HR 89 | Temp 98.6°F

## 2019-12-07 DIAGNOSIS — F4321 Adjustment disorder with depressed mood: Secondary | ICD-10-CM

## 2019-12-07 DIAGNOSIS — Z8616 Personal history of COVID-19: Secondary | ICD-10-CM

## 2019-12-07 DIAGNOSIS — R0600 Dyspnea, unspecified: Secondary | ICD-10-CM | POA: Diagnosis not present

## 2019-12-07 DIAGNOSIS — R0609 Other forms of dyspnea: Secondary | ICD-10-CM

## 2019-12-07 DIAGNOSIS — R509 Fever, unspecified: Secondary | ICD-10-CM | POA: Diagnosis not present

## 2019-12-07 MED ORDER — AMITRIPTYLINE HCL 50 MG PO TABS: 25.0000 mg | ORAL_TABLET | Freq: Every day | ORAL | 1 refills | Status: DC

## 2019-12-07 NOTE — Progress Notes (Signed)
  RFV: long-hauler covid syndrome  Patient ID: Natalie Ford, female   DOB: 12/16/1970, 49 y.o.   MRN: [id number]  HPI Still feeling symptomatic with low grade fever in the am and then peaks in the afternoon, and feels wiped out in the afternoon. Still has arthralgias and headache.  Still on prednisone 10mg . Previously had been on leflunamide <4 wks- not sure if she had been on it long enough to notice difference for seronegative rheumatoid arthritis  Has been trying to do exercise - walking about 1.5 -2 miles. Not noticing significant shortness of breath, mostly fatigue Daily headache not taking daily maintenance or using nsaids as much  Also noticing hair loss, (similar to when she was on MTX) - accelerated in the last 4 wks  Outpatient Encounter Medications as of 12/07/2019  Medication Sig  . albuterol (VENTOLIN HFA) 108 (90 Base) MCG/ACT inhaler Inhale 1-2 puffs into the lungs every 6 (six) hours as needed for wheezing or shortness of breath.  . benzonatate (TESSALON) 200 MG capsule Take 1 capsule (200 mg total) by mouth 2 (two) times daily as needed for cough.  12/09/2019 buPROPion (WELLBUTRIN XL) 150 MG 24 hr tablet bupropion HCl XL 150 mg 24 hr tablet, extended release  TAKE 3 TABLETS BY MOUTH IN THE MORNING  . ibuprofen (ADVIL,MOTRIN) 200 MG tablet Take 200 mg by mouth every 6 (six) hours as needed.  . Multiple Vitamin (MULTIVITAMIN) capsule Take 1 capsule by mouth as needed.    No facility-administered encounter medications on file as of 12/07/2019.     Patient Active Problem List   Diagnosis Date Noted  . History of COVID-19 11/01/2019  . FUO (fever of unknown origin) 11/01/2019  . Cough 11/01/2019  . Shoulder impingement syndrome, left 06/22/2018  . Left leg pain 11/18/2017  . Carpal tunnel syndrome 10/01/2017  . Kidney stones 03/30/2014  . Physical exam, annual 06/29/2013  . Anxiety 06/29/2013  . Borderline hypercholesterolemia 05/14/2010  . DYSPEPSIA 05/14/2010  .  LUMBOSACRAL STRAIN 05/14/2010  . Allergic rhinitis 05/15/2009  . UPPER RESPIRATORY INFECTION 05/09/2009  . GASTROENTERITIS 12/25/2007  . IRRITABLE BOWEL SYNDROME 12/25/2007     Health Maintenance Due  Topic Date Due  . PAP SMEAR-Modifier  04/25/2016     Review of Systems 12 point ros reviewed, positive pertinents listed above Physical Exam   BP 124/81   Pulse 89   Temp 98.6 F (37 C) (Oral)   Did not examine CBC Lab Results  Component Value Date   WBC 8.7 11/15/2019   RBC 4.24 11/15/2019   HGB 14.1 11/15/2019   HCT 40.7 11/15/2019   PLT 280 11/15/2019   MCV 96.0 11/15/2019   MCH 33.3 (H) 11/15/2019   MCHC 34.6 11/15/2019   RDW 11.3 11/15/2019   LYMPHSABS 922 11/15/2019   MONOABS 0.4 03/21/2019   EOSABS 9 (L) 11/15/2019     Assessment and Plan  Chronic headache = will do a trial of amiltryptiline to see if that can help as maintenance therapy  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 Unfortunatetly, new process, and unclear how to manage other than Treat symptoms.   Situational depression =consider addition of 2nd agent to help  Health maintenance = consider covid vaccine, some have reported improvement in their symptoms after taking vaccine but the counter argument also exists, where some have increasing fatigue after taking the vaccine.

## 2019-12-18 NOTE — Progress Notes (Signed)
Cardiology Office Note:    Date:  12/19/2019   ID:  Natalie Ford, DOB [birth year], MRN [id number]  PCP:  Merri Brunette, MD  Cardiologist:  No primary care provider on file.  Electrophysiologist:  None   Referring MD: Casimer Lanius, MD   Chief complaint: dyspnea/palpitations  History of Present Illness:    Natalie Ford is a 49 y.o. female with a hx of COVID-19 infection who is referred by Dr. Deanne Coffer for evaluation of dyspnea/palpitations.  She was diagnosed with Covid in March 2020.  Following this had myalgias, shortness of breath, and palpitations.  She saw rheumatology and was diagnosed with seronegative rheumatoid arthritis.  Started on prednisone in September 2020.  She also did a trial of methotrexate but did not tolerate.  Also has been on leflunomide.  She was suspected of having a reinfection of COVID-19 in January 2021.  She developed pneumonia in February.  She states that since that time she has been having fevers daily.  She has also been short of breath.  Reports that she walks 20 minutes for 5 days/week, but is limited by dyspnea.  Denies any chest pain currently.  Also has been having palpitations.  Described as feeling like heart is racing.  Will happen multiple times per day, can last for hours.  Has checked her heart rate and will will be in 120s.  She was seen by ID for fevers of unknown origin, underwent CT abdomen/pelvis which was unremarkable.  Never smoked.  Mother died of MI in 68s, first had PCI in 63s.   Past Medical History:  Diagnosis Date  . Allergic rhinitis   . COVID-19   . Dyspepsia   . Hypercholesteremia   . IBS (irritable bowel syndrome)   . Lumbosacral strain     Past Surgical History:  Procedure Laterality Date  . CARPAL TUNNEL RELEASE Right   . CESAREAN SECTION     x 2    Current Medications: Current Meds  Medication Sig  . albuterol (VENTOLIN HFA) 108 (90 Base) MCG/ACT inhaler Inhale 1-2 puffs into the lungs every 6 (six) hours as needed  for wheezing or shortness of breath.  [name] amitriptyline (ELAVIL) 50 MG tablet Take 0.5 tablets (25 mg total) by mouth at bedtime. For 7 days, then increase to full tablet  . benzonatate (TESSALON) 200 MG capsule Take 1 capsule (200 mg total) by mouth 2 (two) times daily as needed for cough.  [name] buPROPion (WELLBUTRIN XL) 150 MG 24 hr tablet bupropion HCl XL 150 mg 24 hr tablet, extended release  TAKE 3 TABLETS BY MOUTH IN THE MORNING  . ibuprofen (ADVIL,MOTRIN) 200 MG tablet Take 200 mg by mouth every 6 (six) hours as needed.  . Multiple Vitamin (MULTIVITAMIN) capsule Take 1 capsule by mouth as needed.      Allergies:   Penicillins   Social History   Socioeconomic History  . Marital status: Divorced    Spouse name: Not on file  . Number of children: 2  . Years of education: Not on file  . Highest education level: Not on file  Occupational History  . Occupation: Charity fundraiser, Psychologist, forensic: [employer]  . Occupation: Charity fundraiser, Scientist, research (physical sciences)    Comment: [employer]  Tobacco Use  . Smoking status: Never Smoker  . Smokeless tobacco: Never Used  . Tobacco comment: exposed to second hand smoke  Substance and Sexual Activity  . Alcohol use: Yes    Comment: social use  . Drug use:  No  . Sexual activity: Not on file  Other Topics Concern  . Not on file  Social History Narrative  . Not on file   Social Determinants of Health   Financial Resource Strain:   . Difficulty of Paying Living Expenses:   Food Insecurity:   . Worried About Programme researcher, broadcasting/film/video in the Last Year:   . Barista in the Last Year:   Transportation Needs:   . Freight forwarder (Medical):   [name] Lack of Transportation (Non-Medical):   Physical Activity:   . Days of Exercise per Week:   . Minutes of Exercise per Session:   Stress:   . Feeling of Stress :   Social Connections:   . Frequency of Communication with Friends and Family:   . Frequency of Social Gatherings with Friends and Family:   .  Attends Religious Services:   . Active Member of Clubs or Organizations:   . Attends Banker Meetings:   [name] Marital Status:      Family History: The patient's family history includes Diabetes in her mother; Heart disease in her mother; Kidney disease in her paternal aunt; Leukemia in her father; Stomach cancer in her paternal grandfather.  ROS:   Please see the history of present illness.     All other systems reviewed and are negative.  EKGs/Labs/Other Studies Reviewed:    The following studies were reviewed today:   EKG:  EKG is  ordered today.  The ekg ordered 12/19/19 demonstrates normal sinus rhythm, rate 85, no ST/T abnormalities  Recent Labs: 11/15/2019: ALT 14; BUN 11; Creat 0.87; Hemoglobin 14.1; Platelets 280; Potassium 3.9; Sodium 140  Recent Lipid Panel    Component Value Date/Time   CHOL 192 10/02/2017 1040   TRIG 45.0 10/02/2017 1040   TRIG 65 08/21/2006 0923   HDL 65.00 10/02/2017 1040   CHOLHDL 3 10/02/2017 1040   VLDL 9.0 10/02/2017 1040   LDLCALC 118 (H) 10/02/2017 1040   LDLDIRECT 123.2 07/13/2007 0907    Physical Exam:    VS:  BP 112/86   Pulse 85   Ht 5' (1.524 m)   Wt 108 lb (49 kg)   BMI 21.09 kg/m     Wt Readings from Last 3 Encounters:  12/19/19 108 lb (49 kg)  11/15/19 105 lb (47.6 kg)  11/01/19 107 lb 1.9 oz (48.6 kg)     GEN:  Well nourished, well developed in no acute distress HEENT: Normal NECK: No JVD LYMPHATICS: No lymphadenopathy CARDIAC: RRR, no murmurs, rubs, gallops RESPIRATORY:  Clear to auscultation without rales, wheezing or rhonchi  ABDOMEN: Soft, non-tender, non-distended MUSCULOSKELETAL:  No edema SKIN: Warm and dry NEUROLOGIC:  Alert and oriented x 3 PSYCHIATRIC:  Normal affect   ASSESSMENT:    1. Shortness of breath   2. Palpitations    PLAN:    Dyspnea: Given persistent symptoms since COVID-19 infection, will check echocardiogram to evaluate for myocardial involvement  Palpitations:  Description concerning for arrhythmia, will check Zio patch x3 days  RTC in 2 months   Medication Adjustments/Labs and Tests Ordered: Current medicines are reviewed at length with the patient today.  Concerns regarding medicines are outlined above.  Orders Placed This Encounter  Procedures  . LONG TERM MONITOR (3-14 DAYS)  . EKG 12-Lead  . ECHOCARDIOGRAM COMPLETE   No orders of the defined types were placed in this encounter.   Patient Instructions  Medication Instructions:  Your physician recommends that you continue  on your current medications as directed. Please refer to the Current Medication list given to you today.  Testing/Procedures: Your physician has requested that you have an echocardiogram. Echocardiography is a painless test that uses sound waves to create images of your heart. It provides your doctor with information about the size and shape of your heart and how well your heart's chambers and valves are working. This procedure takes approximately one hour. There are no restrictions for this procedure. This will be done at our [facility] location:  [facility] has requested you wear your ZIO patch monitor for 3 days.   This is a single patch monitor.  [employer] supplies one patch monitor per enrollment.  Additional stickers are not available.   Please do not apply patch if you will be having a Nuclear Stress Test, Echocardiogram, Cardiac CT, MRI, or Chest Xray during the time frame you would be wearing the monitor. The patch cannot be worn during these tests.  You cannot remove and re-apply the ZIO XT patch monitor.   Your ZIO patch monitor will be sent USPS Priority mail from [facility] directly to your home address. The monitor may also be mailed to a PO BOX if home delivery is not available.   It may take 3-5 days to receive your monitor after you have been enrolled.   Once you  have received you monitor, please review enclosed instructions.  Your monitor has already been registered assigning a specific monitor serial # to you.   Applying the monitor   Shave hair from upper left chest.   Hold abrader disc by orange tab.  Rub abrader in 40 strokes over left upper chest as indicated in your monitor instructions.   Clean area with 4 enclosed alcohol pads .  Use all pads to assure are is cleaned thoroughly.  Let dry.   Apply patch as indicated in monitor instructions.  Patch will be place under collarbone on left side of chest with arrow pointing upward.   Rub patch adhesive wings for 2 minutes.Remove white label marked "1".  Remove white label marked "2".  Rub patch adhesive wings for 2 additional minutes.   While looking in a mirror, press and release button in center of patch.  A small green light will flash 3-4 times .  This will be your only indicator the monitor has been turned on.     Do not shower for the first 24 hours.  You may shower after the first 24 hours.   Press button if you feel a symptom. You will hear a small click.  Record Date, Time and Symptom in the Patient Log Book.   When you are ready to remove patch, follow instructions on last 2 pages of Patient Log Book.  Stick patch monitor onto last page of Patient Log Book.   Place Patient Log Book in [place] box.  Use locking tab on box and tape box closed securely.  The Orange and [employer] has [employer] on it.  Please place in mailbox as soon as possible.  Your physician should have your test results approximately 7 days after the monitor has been mailed back to [facility].   Call [facility] at [phone] if you have questions regarding your ZIO XT patch monitor.  Call them immediately if you see an orange light blinking on your monitor.   If your monitor falls  off in less than 4 days contact our Monitor department at [phone].  If your monitor becomes loose or falls  off after 4 days call [employer] at [phone] for suggestions on securing your monitor.    Follow-Up: At [facility], you and your health needs are our priority.  As part of our continuing mission to provide you with exceptional heart care, we have created designated Provider Care Teams.  These Care Teams include your primary Cardiologist (physician) and Advanced Practice Providers (APPs -  Physician Assistants and Nurse Practitioners) who all work together to provide you with the care you need, when you need it.  We recommend signing up for the patient portal called "MyChart".  Sign up information is provided on this After Visit Summary.  MyChart is used to connect with patients for Virtual Visits (Telemedicine).  Patients are able to view lab/test results, encounter notes, upcoming appointments, etc.  Non-urgent messages can be sent to your provider as well.   To learn more about what you can do with MyChart, go to [email or web address].    Your next appointment:   2 month(s)  The format for your next appointment:   In Person  Provider:   Epifanio Lesches, MD         Signed, Little Ishikawa, MD  12/19/2019 9:08 AM    [facility]

## 2019-12-19 ENCOUNTER — Ambulatory Visit: Payer: No Typology Code available for payment source | Admitting: Cardiology

## 2019-12-19 ENCOUNTER — Other Ambulatory Visit: Payer: Self-pay

## 2019-12-19 ENCOUNTER — Encounter: Payer: Self-pay | Admitting: *Deleted

## 2019-12-19 ENCOUNTER — Encounter: Payer: Self-pay | Admitting: Cardiology

## 2019-12-19 VITALS — BP 112/86 | HR 85 | Ht 60.0 in | Wt 108.0 lb

## 2019-12-19 DIAGNOSIS — R002 Palpitations: Secondary | ICD-10-CM | POA: Diagnosis not present

## 2019-12-19 DIAGNOSIS — R0602 Shortness of breath: Secondary | ICD-10-CM | POA: Diagnosis not present

## 2019-12-19 NOTE — Progress Notes (Signed)
Patient ID: Natalie Ford, female   DOB: [birth year], 50 y.o.   MRN: [id number] Patient enrolled for [employer] to send a 3 day ZIO XT long term holter monitor to her home.

## 2019-12-19 NOTE — Patient Instructions (Signed)
Medication Instructions:  Your physician recommends that you continue on your current medications as directed. Please refer to the Current Medication list given to you today.  Testing/Procedures: Your physician has requested that you have an echocardiogram. Echocardiography is a painless test that uses sound waves to create images of your heart. It provides your doctor with information about the size and shape of your heart and how well your heart's chambers and valves are working. This procedure takes approximately one hour. There are no restrictions for this procedure. This will be done at our [facility] location:  [address]   ZIO XT- Long Term Monitor Instructions   Your physician has requested you wear your ZIO patch monitor for 3 days.   This is a single patch monitor.  [employer] supplies one patch monitor per enrollment.  Additional stickers are not available.   Please do not apply patch if you will be having a Nuclear Stress Test, Echocardiogram, Cardiac CT, MRI, or Chest Xray during the time frame you would be wearing the monitor. The patch cannot be worn during these tests.  You cannot remove and re-apply the ZIO XT patch monitor.   Your ZIO patch monitor will be sent USPS Priority mail from [facility] directly to your home address. The monitor may also be mailed to a PO BOX if home delivery is not available.   It may take 3-5 days to receive your monitor after you have been enrolled.   Once you have received you monitor, please review enclosed instructions.  Your monitor has already been registered assigning a specific monitor serial # to you.   Applying the monitor   Shave hair from upper left chest.   Hold abrader disc by orange tab.  Rub abrader in 40 strokes over left upper chest as indicated in your monitor instructions.   Clean area with 4 enclosed alcohol pads .  Use all pads to assure are is cleaned thoroughly.  Let dry.   Apply patch as  indicated in monitor instructions.  Patch will be place under collarbone on left side of chest with arrow pointing upward.   Rub patch adhesive wings for 2 minutes.Remove white label marked "1".  Remove white label marked "2".  Rub patch adhesive wings for 2 additional minutes.   While looking in a mirror, press and release button in center of patch.  A small green light will flash 3-4 times .  This will be your only indicator the monitor has been turned on.     Do not shower for the first 24 hours.  You may shower after the first 24 hours.   Press button if you feel a symptom. You will hear a small click.  Record Date, Time and Symptom in the Patient Log Book.   When you are ready to remove patch, follow instructions on last 2 pages of Patient Log Book.  Stick patch monitor onto last page of Patient Log Book.   Place Patient Log Book in [place] box.  Use locking tab on box and tape box closed securely.  The Orange and [employer] has [employer] on it.  Please place in mailbox as soon as possible.  Your physician should have your test results approximately 7 days after the monitor has been mailed back to [facility].   Call [facility] Customer Care at [phone] if you have questions regarding your ZIO XT patch monitor.  Call them immediately if you see an orange light blinking  on your monitor.   If your monitor falls off in less than 4 days contact our Monitor department at [phone].  If your monitor becomes loose or falls off after 4 days call [employer] at [phone] for suggestions on securing your monitor.    Follow-Up: At [facility], you and your health needs are our priority.  As part of our continuing mission to provide you with exceptional heart care, we have created designated Provider Care Teams.  These Care Teams include your primary Cardiologist (physician) and Advanced Practice Providers (APPs -  Physician Assistants and Nurse Practitioners) who all work together  to provide you with the care you need, when you need it.  We recommend signing up for the patient portal called "MyChart".  Sign up information is provided on this After Visit Summary.  MyChart is used to connect with patients for Virtual Visits (Telemedicine).  Patients are able to view lab/test results, encounter notes, upcoming appointments, etc.  Non-urgent messages can be sent to your provider as well.   To learn more about what you can do with MyChart, go to [email or web address].    Your next appointment:   2 month(s)  The format for your next appointment:   In Person  Provider:   Oswaldo Milian, MD

## 2019-12-20 ENCOUNTER — Ambulatory Visit: Payer: No Typology Code available for payment source | Admitting: Internal Medicine

## 2019-12-20 ENCOUNTER — Telehealth: Payer: Self-pay | Admitting: *Deleted

## 2019-12-20 ENCOUNTER — Encounter: Payer: Self-pay | Admitting: Internal Medicine

## 2019-12-20 VITALS — BP 140/80 | HR 107 | Temp 97.9°F | Ht 60.0 in | Wt 107.2 lb

## 2019-12-20 DIAGNOSIS — R0602 Shortness of breath: Secondary | ICD-10-CM | POA: Diagnosis not present

## 2019-12-20 DIAGNOSIS — U071 COVID-19: Secondary | ICD-10-CM

## 2019-12-20 DIAGNOSIS — R509 Fever, unspecified: Secondary | ICD-10-CM

## 2019-12-20 DIAGNOSIS — M0600 Rheumatoid arthritis without rheumatoid factor, unspecified site: Secondary | ICD-10-CM

## 2019-12-20 MED ORDER — BREO ELLIPTA 100-25 MCG/INH IN AEPB: 1.0000 | INHALATION_SPRAY | Freq: Every day | RESPIRATORY_TRACT | 5 refills | Status: DC

## 2019-12-20 MED ORDER — ALBUTEROL SULFATE HFA 108 (90 BASE) MCG/ACT IN AERS: 1.0000 | INHALATION_SPRAY | Freq: Four times a day (QID) | RESPIRATORY_TRACT | 5 refills | Status: AC | PRN

## 2019-12-20 NOTE — Progress Notes (Signed)
Natalie Ford    [id number]    Sep 29, 1970  Primary Care Physician:Pharr, Thayer Jew, MD  Referring Physician: Carlyle Basques, MD [address] [place],  [place] [address] Reason for Consultation: shortness of breath Date of Consultation: 12/20/2019  Chief complaint:   Chief Complaint  Patient presents with  . Consult    sob, doe, hx covid.  covid March 2020 and January of 2021. low grade fevers, folled by ID.  RA diag of 2020.  Unsure of sourse of fevers.     HPI: Natalie Ford is a 49 y.o. woman who presents for new patient evaluation for persistent fevers and dyspnea. She has a history of rheumatoid arthritis diagnosed in 2020 and has been been on leflunomide and prednisone(currently off leflunomide for persistent fevers and only on 10 mg prednisone). Initial covid 19 infection was in march 2020 and again Jan 2021 which was suspected to be a reinfection. Treated with a course of azithromycin in March when symptoms got worse. Also saw cardiology this week for palpitations and HR in the 120s and is getting Echo and zio patch checked.   Rheumatologist: Sees Dr. Claudie Leach with [facility]. Had SE with methotrexate when she started treatment in fall 2020. Then was put on leflunomide (now off.) Just on prednisone 10 mg now.  She has upper extremity arthralgias,(right hand, left elbow. Knee.) She has no rashes but is having some vision changes with blurriness, but no pain.   She has dyspnea on minimal exertion such as walking, doing stairs at home. Does improve with albuterol inhaler.  Uses 1-2 times/week. Cough is dry with voice hoarseness and she has post nasal drainage. Not taking anything for this.  Her dyspnea and cough are improving overall since march. In February she was on an additional inhaler and she thinks this might have helped as well.   Also having daily fevers (99.-100 in the mornings,) and hasn't dropped below this since February. Up to  100.9-101.8 in the afternoons. She usually feels worse in the afternoons and is exhausted when these comes on. Taking motrin   Prior to covid infection in 2020, never had pneumonia or respiratory issues. Has lost about 10 lbs unintentionally due to anorexia. Also having hair loss. Oxygen saturations have maintained 95-96% even at home with exertion.   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   Social history: Social History   Occupational History  . Occupation: Therapist, sports, Multimedia programmer: [employer]  . Occupation: Therapist, sports, Copywriter, advertising    Comment: [employer]  Tobacco Use  . Smoking status: Never Smoker  . Smokeless tobacco: Never Used  . Tobacco comment: exposed to second hand smoke  Substance and Sexual Activity  . Alcohol use: Yes    Comment: social use  . Drug use: No  . Sexual activity: Not on file    Relevant family history: Family History  Problem Relation Age of Onset  . Diabetes Mother   . Heart disease Mother   . Leukemia Father   . Stomach cancer Paternal Grandfather   . Kidney disease Paternal Aunt     Past Medical History:  Diagnosis Date  . Allergic rhinitis   . COVID-19   . Dyspepsia   . Hypercholesteremia   . IBS (irritable bowel syndrome)   . Lumbosacral strain     Past Surgical History:  Procedure Laterality Date  . CARPAL TUNNEL RELEASE Right   .  CESAREAN SECTION     x 2    Review of systems: Review of Systems  Constitutional: Positive for fever, malaise/fatigue and weight loss. Negative for chills.  HENT: Positive for congestion. Negative for sinus pain and sore throat.   Eyes: Negative for discharge and redness.  Respiratory: Positive for cough and shortness of breath. Negative for hemoptysis, sputum production and wheezing.   Cardiovascular: Positive for palpitations. Negative for chest pain and leg swelling.  Gastrointestinal: Negative for heartburn, nausea and vomiting.  Musculoskeletal: Positive  for joint pain and myalgias.  Skin: Negative for rash.  Neurological: Negative for dizziness, tremors, focal weakness and headaches.  Endo/Heme/Allergies: Negative for environmental allergies.  Psychiatric/Behavioral: Negative for depression. The patient is not nervous/anxious.   All other systems reviewed and are negative.   Physical Exam: Blood pressure 140/80, pulse (!) 107, temperature 97.9 F (36.6 C), temperature source Temporal, height 5' (1.524 m), weight 107 lb 3.2 oz (48.6 kg), SpO2 100 %. Gen:      No acute distress ENT:   Mild cobblestoning in oropharynx Lungs:    No increased respiratory effort, symmetric chest wall excursion, clear to auscultation bilaterally, no wheezes or crackles CV:         Regular rate and rhythm; no murmurs, rubs, or gallops.  No pedal edema Abd:      + bowel sounds; soft, non-tender; no distension MSK: no acute synovitis of DIP or PIP joints, no mechanics hands.  Skin:      Warm and dry; no rashes Neuro: normal speech, no focal facial asymmetry Psych: alert and oriented x3, normal mood and affect   Data Reviewed/Medical Decision Making:  Independent interpretation of tests: Imaging: . Review of patient's chest xray in Feb 2021 images revealed no acute cardiopulmonary process. No effusion, pneumonia or hilar fullness.  The patient's images have been independently reviewed by me.  No lymphadenopathy on CT A/P.   PFTs: None on file  Labs:  Lab Results  Component Value Date   WBC 8.7 11/15/2019   HGB 14.1 11/15/2019   HCT 40.7 11/15/2019   MCV 96.0 11/15/2019   PLT 280 11/15/2019   Lab Results  Component Value Date   NA 140 11/15/2019   K 3.9 11/15/2019   CL 105 11/15/2019   CO2 26 11/15/2019   Immunoglobulins in normal limits.  Immunization status:  Immunization History  Administered Date(s) Administered  . Influenza Split 05/26/2011, 06/15/2013  . Influenza Whole 06/04/2009  . Influenza, Quadrivalent, Recombinant, Inj, Pf  06/04/2018  . Influenza,inj,Quad PF,6+ Mos 05/26/2017, 06/21/2019  . Tdap 08/25/2010    . I reviewed prior external note(s) from Dr. Baxter Flattery, ED, covid clinic . I reviewed the result(s) of the labs and imaging as noted above.  . I have ordered PFTs   Assessment:  Shortness of breath Fever of Unknown origin COVID 19 infection Seronegative Rheumatoid Arthritis Cough - post nasal drip  Plan/Recommendations:  Have reviewed her extensive blood work including negative inflammatory markers (ESR, CRP, ferritin.) Will obtain serologies and records from rheumatology clinic. Full set of PFTs.  Will trial ICS-LABA with Breo Recommend nasal saline rinses, OTC anti-histamine for cough related to PND  We discussed disease management and progression at length today.   I spent 61 minutes in the care of this patient today including pre-charting, chart review, review of results, face-to-face care, coordination of care and communication with consultants etc.).  Return to Care: Return in about 6 weeks (around 01/31/2020).  Lenice Llamas, MD Pulmonary and  [place]  CC: Carlyle Basques, MD

## 2019-12-20 NOTE — Patient Instructions (Signed)
The patient should have follow up scheduled with myself in 6 weeks  Prior to next visit patient should have: Full set of PFTs  Start taking Breo inhaler once/day.  Take the albuterol rescue inhaler every 4 to 6 hours as needed for wheezing or shortness of breath. You can also take it 15 minutes before exercise or exertional activity. Side effects include heart racing or pounding, jitters or anxiety. If you have a history of an irregular heart rhythm, it can make this worse. Can also give some patients a hard time sleeping.  To inhale the aerosol using an inhaler, follow these steps:  1. Remove the protective dust cap from the end of the mouthpiece. If the dust cap was not placed on the mouthpiece, check the mouthpiece for dirt or other objects. Be sure that the canister is fully and firmly inserted in the mouthpiece. 2. If you are using the inhaler for the first time or if you have not used the inhaler in more than 14 days, you will need to prime it. You may also need to prime the inhaler if it has been dropped. Ask your pharmacist or check the manufacturer's information if this happens. To prime the inhaler, shake it well and then press down on the canister 4 times to release 4 sprays into the air, away from your face. Be careful not to get albuterol in your eyes. 3. Shake the inhaler well. 4. Breathe out as completely as possible through your mouth. 4. Hold the canister with the mouthpiece on the bottom, facing you and the canister pointing upward. Place the open end of the mouthpiece into your mouth. Close your lips tightly around the mouthpiece. 6. Breathe in slowly and deeply through the mouthpiece.At the same time, press down once on the container to spray the medication into your mouth. 7. Try to hold your breath for 10 seconds. remove the inhaler, and breathe out slowly. 8. If you were told to use 2 puffs, wait 1 minute and then repeat steps 3-7. 9. Replace the protective cap on the  inhaler. 10. Clean your inhaler regularly. Follow the manufacturer's directions carefully and ask your doctor or pharmacist if you have any questions about cleaning your inhaler.  Check the back of the inhaler to keep track of the total number of doses left on the inhaler.

## 2019-12-22 ENCOUNTER — Ambulatory Visit (INDEPENDENT_AMBULATORY_CARE_PROVIDER_SITE_OTHER): Payer: No Typology Code available for payment source

## 2019-12-22 DIAGNOSIS — R002 Palpitations: Secondary | ICD-10-CM | POA: Diagnosis not present

## 2019-12-28 ENCOUNTER — Other Ambulatory Visit: Payer: Self-pay | Admitting: Obstetrics and Gynecology

## 2019-12-28 DIAGNOSIS — R928 Other abnormal and inconclusive findings on diagnostic imaging of breast: Secondary | ICD-10-CM

## 2020-01-03 ENCOUNTER — Other Ambulatory Visit: Payer: Self-pay

## 2020-01-03 ENCOUNTER — Ambulatory Visit (HOSPITAL_COMMUNITY): Payer: No Typology Code available for payment source | Attending: Cardiovascular Disease

## 2020-01-03 DIAGNOSIS — R0602 Shortness of breath: Secondary | ICD-10-CM | POA: Insufficient documentation

## 2020-01-05 ENCOUNTER — Other Ambulatory Visit: Payer: Self-pay

## 2020-01-05 ENCOUNTER — Telehealth (INDEPENDENT_AMBULATORY_CARE_PROVIDER_SITE_OTHER): Payer: No Typology Code available for payment source | Admitting: Internal Medicine

## 2020-01-05 DIAGNOSIS — Z8616 Personal history of COVID-19: Secondary | ICD-10-CM | POA: Diagnosis not present

## 2020-01-05 DIAGNOSIS — R509 Fever, unspecified: Secondary | ICD-10-CM | POA: Diagnosis not present

## 2020-01-05 DIAGNOSIS — F4321 Adjustment disorder with depressed mood: Secondary | ICD-10-CM

## 2020-01-05 NOTE — Progress Notes (Signed)
RFV: follow up for sequelae since covid-19 disease  Patient ID: Natalie Ford, female   DOB: June 14, 1971, 49 y.o.   MRN: [id number]  HPI Natalie Ford is a 49yo F who was previously in good health, has had covid-19, with possible reinfection, now with long term sequelae. She still remains on low dose steroids for seronegative RA. She reports still having fevers but mostly more fatigue this week. More so this week than last. Not fixing meals as she does usually. When she went to her ob appt, found breast lump and Thyroid palpable nodule, thus getting [place] today and mammogram.  Temp Varies to getting up to 101F. No productive cough. Weight is stable, still ongoing fevers. Has seen pulmonary who provided new inhalers.   Still gets headaches but did not tolerate amitriptyline. Joint pain surprisingly better, more fatigue is her concern.  QTF in nov 2020 negative.   Outpatient Encounter Medications as of 01/05/2020  Medication Sig  . albuterol (VENTOLIN HFA) 108 (90 Base) MCG/ACT inhaler Inhale 1-2 puffs into the lungs every 6 (six) hours as needed for wheezing or shortness of breath.  [name] amitriptyline (ELAVIL) 50 MG tablet Take 0.5 tablets (25 mg total) by mouth at bedtime. For 7 days, then increase to full tablet  . benzonatate (TESSALON) 200 MG capsule Take 1 capsule (200 mg total) by mouth 2 (two) times daily as needed for cough.  [name] buPROPion (WELLBUTRIN XL) 150 MG 24 hr tablet bupropion HCl XL 150 mg 24 hr tablet, extended release  TAKE 3 TABLETS BY MOUTH IN THE MORNING  . fluticasone furoate-vilanterol (BREO ELLIPTA) 100-25 MCG/INH AEPB Inhale 1 puff into the lungs daily.  [name] ibuprofen (ADVIL,MOTRIN) 200 MG tablet Take 200 mg by mouth every 6 (six) hours as needed.  . Multiple Vitamin (MULTIVITAMIN) capsule Take 1 capsule by mouth as needed.   . predniSONE (DELTASONE) 10 MG tablet Take 10 mg by mouth daily with breakfast.   No facility-administered encounter medications on file as of 01/05/2020.     Patient Active Problem List   Diagnosis Date Noted  . History of COVID-19 11/01/2019  . FUO (fever of unknown origin) 11/01/2019  . Cough 11/01/2019  . Shoulder impingement syndrome, left 06/22/2018  . Left leg pain 11/18/2017  . Carpal tunnel syndrome 10/01/2017  . Kidney stones 03/30/2014  . Physical exam, annual 06/29/2013  . Anxiety 06/29/2013  . Borderline hypercholesterolemia 05/14/2010  . DYSPEPSIA 05/14/2010  . LUMBOSACRAL STRAIN 05/14/2010  . Allergic rhinitis 05/15/2009  . UPPER RESPIRATORY INFECTION 05/09/2009  . GASTROENTERITIS 12/25/2007  . IRRITABLE BOWEL SYNDROME 12/25/2007     Health Maintenance Due  Topic Date Due  . PAP SMEAR-Modifier  04/25/2016     Review of Systems 12 point reviewed, see hpi for positive pertinents Physical Exam   There were no vitals taken for this visit. Video visit CBC Lab Results  Component Value Date   WBC 8.7 11/15/2019   RBC 4.24 11/15/2019   HGB 14.1 11/15/2019   HCT 40.7 11/15/2019   PLT 280 11/15/2019   MCV 96.0 11/15/2019   MCH 33.3 (H) 11/15/2019   MCHC 34.6 11/15/2019   RDW 11.3 11/15/2019   LYMPHSABS 922 11/15/2019   MONOABS 0.4 03/21/2019   EOSABS 9 (L) 11/15/2019    BMET Lab Results  Component Value Date   NA 140 11/15/2019   K 3.9 11/15/2019   CL 105 11/15/2019   CO2 26 11/15/2019   GLUCOSE 103 (H) 11/15/2019   BUN 11 11/15/2019  CREATININE 0.87 11/15/2019   CALCIUM 9.6 11/15/2019   GFRNONAA 79 11/15/2019   GFRAA 91 11/15/2019      Assessment and Plan Fever and fatigue - will check QTF - call her on Tuesday for U/S thyroid results, and mammo findings - have checked most infectious causes, ruled out. Will reach out to oncology to see if need to pursue other non-infectious work up, and if BM aspiration is warranted.  Situational depression = as this process continues to progress,without answer, it appears that [name] has dysthymia, and appropriately concerned with recent findings  from OB.

## 2020-01-06 ENCOUNTER — Ambulatory Visit
Admission: RE | Admit: 2020-01-06 | Discharge: 2020-01-06 | Disposition: A | Discharge status: Home / Self Care | Payer: No Typology Code available for payment source | Source: Ambulatory Visit | Attending: Obstetrics and Gynecology | Admitting: Obstetrics and Gynecology

## 2020-01-06 ENCOUNTER — Other Ambulatory Visit: Payer: Self-pay

## 2020-01-06 DIAGNOSIS — R928 Other abnormal and inconclusive findings on diagnostic imaging of breast: Secondary | ICD-10-CM

## 2020-01-07 ENCOUNTER — Other Ambulatory Visit (HOSPITAL_COMMUNITY)
Admission: RE | Admit: 2020-01-07 | Discharge: 2020-01-07 | Disposition: A | Discharge status: Home / Self Care | Payer: No Typology Code available for payment source | Source: Ambulatory Visit | Attending: Internal Medicine | Admitting: Internal Medicine

## 2020-01-07 DIAGNOSIS — Z20822 Contact with and (suspected) exposure to covid-19: Secondary | ICD-10-CM | POA: Insufficient documentation

## 2020-01-07 DIAGNOSIS — Z01812 Encounter for preprocedural laboratory examination: Secondary | ICD-10-CM | POA: Diagnosis not present

## 2020-01-07 LAB — SARS CORONAVIRUS 2 (TAT 6-24 HRS): SARS Coronavirus 2: NEGATIVE

## 2020-01-11 ENCOUNTER — Other Ambulatory Visit: Payer: Self-pay

## 2020-01-11 ENCOUNTER — Ambulatory Visit (INDEPENDENT_AMBULATORY_CARE_PROVIDER_SITE_OTHER): Payer: No Typology Code available for payment source | Admitting: Internal Medicine

## 2020-01-11 DIAGNOSIS — R0602 Shortness of breath: Secondary | ICD-10-CM | POA: Diagnosis not present

## 2020-01-11 LAB — PULMONARY FUNCTION TEST
DL/VA % pred: 131 %
DL/VA: 5.92 ml/min/mmHg/L
DLCO cor % pred: 116 %
DLCO cor: 20.79 ml/min/mmHg
DLCO unc % pred: 116 %
DLCO unc: 20.79 ml/min/mmHg
FEF 25-75 Post: 2.23 L/sec
FEF 25-75 Pre: 0.64 L/sec
FEF2575-%Change-Post: 250 %
FEF2575-%Pred-Post: 87 %
FEF2575-%Pred-Pre: 24 %
FEV1-%Change-Post: 52 %
FEV1-%Pred-Post: 83 %
FEV1-%Pred-Pre: 54 %
FEV1-Post: 2 L
FEV1-Pre: 1.31 L
FEV1FVC-%Change-Post: 60 %
FEV1FVC-%Pred-Pre: 67 %
FEV6-%Change-Post: -3 %
FEV6-%Pred-Post: 78 %
FEV6-%Pred-Pre: 81 %
FEV6-Post: 2.29 L
FEV6-Pre: 2.37 L
FEV6FVC-%Pred-Post: 102 %
FEV6FVC-%Pred-Pre: 102 %
FVC-%Change-Post: -4 %
FVC-%Pred-Post: 76 %
FVC-%Pred-Pre: 80 %
FVC-Post: 2.29 L
FVC-Pre: 2.4 L
Post FEV1/FVC ratio: 88 %
Post FEV6/FVC ratio: 100 %
Pre FEV1/FVC ratio: 55 %
Pre FEV6/FVC Ratio: 100 %
RV % pred: 136 %
RV: 2.04 L
TLC % pred: 100 %
TLC: 4.34 L

## 2020-01-11 NOTE — Progress Notes (Signed)
Full PFT performed today. °

## 2020-01-13 ENCOUNTER — Telehealth: Payer: Self-pay | Admitting: Internal Medicine

## 2020-01-13 NOTE — Telephone Encounter (Signed)
Spoke with pt. She has a pending OV on 01/26/2020 with Dr. Celine Mans. Pt just wanted to make sure that her PFT results would be gone over with her. Advised her Dr. Celine Mans would go over her results with her. Nothing further was needed.

## 2020-01-26 ENCOUNTER — Encounter: Payer: Self-pay | Admitting: *Deleted

## 2020-01-26 ENCOUNTER — Other Ambulatory Visit: Payer: Self-pay

## 2020-01-26 ENCOUNTER — Encounter: Payer: Self-pay | Admitting: Internal Medicine

## 2020-01-26 ENCOUNTER — Ambulatory Visit: Payer: No Typology Code available for payment source | Admitting: Internal Medicine

## 2020-01-26 ENCOUNTER — Other Ambulatory Visit (HOSPITAL_COMMUNITY): Payer: Self-pay | Admitting: Internal Medicine

## 2020-01-26 VITALS — BP 122/78 | HR 93 | Temp 98.8°F | Ht 60.0 in | Wt 111.6 lb

## 2020-01-26 DIAGNOSIS — R0982 Postnasal drip: Secondary | ICD-10-CM | POA: Diagnosis not present

## 2020-01-26 DIAGNOSIS — J4540 Moderate persistent asthma, uncomplicated: Secondary | ICD-10-CM | POA: Diagnosis not present

## 2020-01-26 DIAGNOSIS — U071 COVID-19: Secondary | ICD-10-CM | POA: Diagnosis not present

## 2020-01-26 MED ORDER — ADVAIR HFA 230-21 MCG/ACT IN AERO: 2.0000 | INHALATION_SPRAY | Freq: Two times a day (BID) | RESPIRATORY_TRACT | 12 refills | Status: AC

## 2020-01-26 NOTE — Patient Instructions (Addendum)
The patient should have follow up scheduled with myself in 4 months.   Stop taking Breo once a day. Switch to advair two puffs in the morning, two puffs at night.  Switch from claritin to cetirizine for the nasal drainage.   Take the albuterol rescue inhaler every 4 to 6 hours as needed for wheezing or shortness of breath. You can also take it 15 minutes before exercise or exertional activity.  Side effects include heart racing or pounding, jitters or anxiety. If you have a history of an irregular heart rhythm, it can make this worse. Can also give some patients a hard time sleeping.  To inhale the aerosol using an inhaler, follow these steps:  1. Remove the protective dust cap from the end of the mouthpiece. If the dust cap was not placed on the mouthpiece, check the mouthpiece for dirt or other objects. Be sure that the canister is fully and firmly inserted in the mouthpiece. 2. If you are using the inhaler for the first time or if you have not used the inhaler in more than 14 days, you will need to prime it. You may also need to prime the inhaler if it has been dropped. Ask your pharmacist or check the manufacturer's information if this happens. To prime the inhaler, shake it well and then press down on the canister 4 times to release 4 sprays into the air, away from your face. Be careful not to get albuterol in your eyes. 3. Shake the inhaler well. 4. Breathe out as completely as possible through your mouth. 4. Hold the canister with the mouthpiece on the bottom, facing you and the canister pointing upward. Place the open end of the mouthpiece into your mouth. Close your lips tightly around the mouthpiece. 6. Breathe in slowly and deeply through the mouthpiece.At the same time, press down once on the container to spray the medication into your mouth. 7. Try to hold your breath for 10 seconds. remove the inhaler, and breathe out slowly. 8. If you were told to use 2 puffs, wait 1 minute and then  repeat steps 3-7. 9. Replace the protective cap on the inhaler. 10. Clean your inhaler regularly. Follow the manufacturer's directions carefully and ask your doctor or pharmacist if you have any questions about cleaning your inhaler.  Check the back of the inhaler to keep track of the total number of doses left on the inhaler.

## 2020-01-26 NOTE — Progress Notes (Signed)
Natalie Ford    [id number]    1970/11/11  Primary Care Physician:Pharr, Thayer Jew, MD Date of Appointment: 01/26/2020 Established Patient Visit  Chief complaint:   Chief Complaint  Patient presents with  . Follow-up    Pt states she is about the same since last visit. Pt has been coughing more and is not sure if it is due to the weather. When she coughs, she has pain in upper back. Pt is still becoming SOB when trying to do activities.     HPI: Natalie Ford is a 49 y.o. woman with history of rheumatoid arthritis who had covid 19 infection and persistent dyspnea and cough.   Interval Updates: Here for follow up today. Was started on Breo, anti-histamine and flonase. Since then no hospitalizations or ED visits. Had PFTs.  Having symptoms of hair loss. Swelling that appears in both legs mid-day and resolves with lifting up her legs. Legs are normal size in the morning.  Albuterol use about 3 times/day. Feels a bit jittery. Memory Dance has been helping. No thrush or adverse effects. Sometimes she wishes she could take another dose because it does help.  Still having symptoms or fatigue.   I have reviewed the patient's family social and past medical history and updated as appropriate.   Past Medical History:  Diagnosis Date  . Allergic rhinitis   . COVID-19   . Dyspepsia   . Hypercholesteremia   . IBS (irritable bowel syndrome)   . Lumbosacral strain     Past Surgical History:  Procedure Laterality Date  . CARPAL TUNNEL RELEASE Right   . CESAREAN SECTION     x 2    Family History  Problem Relation Age of Onset  . Diabetes Mother   . Heart disease Mother   . Leukemia Father   . Stomach cancer Paternal Grandfather   . Kidney disease Paternal Aunt     Social History   Occupational History  . Occupation: Therapist, sports, Multimedia programmer: [employer]  . Occupation: Therapist, sports, Copywriter, advertising    Comment: [employer]  Tobacco Use  . Smoking status: Never Smoker  .  Smokeless tobacco: Never Used  . Tobacco comment: exposed to second hand smoke  Substance and Sexual Activity  . Alcohol use: Yes    Comment: social use  . Drug use: No  . Sexual activity: Not on file     Physical Exam: Blood pressure 122/78, pulse 93, temperature 98.8 F (37.1 C), temperature source Oral, height 5' (1.524 m), weight 111 lb 9.6 oz (50.6 kg), SpO2 99 %.  Gen:      No acute distress Lungs:    No increased respiratory effort, symmetric chest wall excursion, clear to auscultation bilaterally, no wheezes or crackles CV:         Regular rate and rhythm; no murmurs, rubs, or gallops.  No pedal edema Ext:         Bilateral legs are warm, well perfused, no swelling or erythema.   Data Reviewed: I have personally reviewed the patient's PFTs and moderately severe airflow limitation with +BD response.  PFTs:  PFT Results Latest Ref Rng & Units 01/11/2020  FVC-Pre L 2.40  FVC-Predicted Pre % 80  FVC-Post L 2.29  FVC-Predicted Post % 76  Pre FEV1/FVC % % 55  Post FEV1/FCV % % 88  FEV1-Pre L 1.31  FEV1-Predicted Pre % 54  FEV1-Post L 2.00  DLCO UNC% % 116  DLCO  COR %Predicted % 131  TLC L 4.34  TLC % Predicted % 100  RV % Predicted % 136     Immunization status: Immunization History  Administered Date(s) Administered  . Influenza Split 05/26/2011, 06/15/2013  . Influenza Whole 06/04/2009  . Influenza, Quadrivalent, Recombinant, Inj, Pf 06/04/2018  . Influenza,inj,Quad PF,6+ Mos 05/26/2017, 06/21/2019  . 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 Continue albuterol. Continue flonase. Add nasal saline rinses.  Switch from claritin to cetirizine.   Return to Care: Return in about 4 months (around 05/27/2020), or if symptoms worsen or fail to improve, for  shortness of breath.   Durel Salts, MD Pulmonary and Critical Care Medicine [facility] Office:[phone]

## 2020-01-31 ENCOUNTER — Ambulatory Visit: Payer: No Typology Code available for payment source | Admitting: Internal Medicine

## 2020-01-31 ENCOUNTER — Telehealth (INDEPENDENT_AMBULATORY_CARE_PROVIDER_SITE_OTHER): Payer: No Typology Code available for payment source | Admitting: Internal Medicine

## 2020-01-31 ENCOUNTER — Other Ambulatory Visit: Payer: Self-pay

## 2020-01-31 DIAGNOSIS — G933 Postviral fatigue syndrome: Secondary | ICD-10-CM

## 2020-01-31 DIAGNOSIS — Z8616 Personal history of COVID-19: Secondary | ICD-10-CM | POA: Diagnosis not present

## 2020-01-31 DIAGNOSIS — R509 Fever, unspecified: Secondary | ICD-10-CM | POA: Diagnosis not present

## 2020-01-31 DIAGNOSIS — R0609 Other forms of dyspnea: Secondary | ICD-10-CM

## 2020-01-31 DIAGNOSIS — G9331 Postviral fatigue syndrome: Secondary | ICD-10-CM

## 2020-01-31 DIAGNOSIS — R0600 Dyspnea, unspecified: Secondary | ICD-10-CM

## 2020-01-31 MED ORDER — FLUVOXAMINE MALEATE 50 MG PO TABS: 50.0000 mg | ORAL_TABLET | Freq: Two times a day (BID) | ORAL | 1 refills | Status: DC

## 2020-01-31 NOTE — Progress Notes (Signed)
Virtual Visit via Video Note  I connected with Natalie Ford on 01/31/20 at  2:15 PM EDT by a video enabled telemedicine application and verified that I am speaking with the correct person using two identifiers.  Location: Patient: in home Provider: in clinic   I discussed the limitations of evaluation and management by telemedicine and the availability of in person appointments. The patient expressed understanding and agreed to proceed.  History of Present Illness: - mammo and thyroid imaging is normal - still fatigue - diagnosed with post-covid related asthma and started on steroids - still feeling "fevers", temp in 100.X not 101 - rheumatologist reticent to starting any biologics, though her sero-negative arthritis isn't having a flare. Rheum recommending to drop her 10mg  daily dose down to 5mg - which she just started - she is inquiring about fluvoxamine as possible treatment for PACS - she is starting to use medication shampoo for hairloss    Observations/Objective: Pleasant affect, gen = a x o by 3.   Assessment and Plan: Some limited research on use of fluvoxamine in covid long-haulers, patient is interested in doing short trial. We will prescribe Fluvoxamine 50mg  BID x 2 wk to see if noticing any improvement in fatigue, low grade fevers  hairloss = continue with medicated shampoo. Suspect it is associated with stress from illness.  Will refer to [facility] covid recovery clinic through their PMR department  Follow Up Instructions: Refer to [facility] recovery clinic    I discussed the assessment and treatment plan with the patient. The patient was provided an opportunity to ask questions and all were answered. The patient agreed with the plan and demonstrated an understanding of the instructions.   The patient was advised to call back or seek an in-person evaluation if the symptoms worsen or if the condition fails to improve as anticipated.  I provided 18 minutes of  non-face-to-face time during this encounter.   , MD

## 2020-02-28 ENCOUNTER — Ambulatory Visit: Payer: No Typology Code available for payment source | Admitting: Cardiology

## 2020-03-06 ENCOUNTER — Other Ambulatory Visit: Payer: Self-pay

## 2020-03-06 ENCOUNTER — Encounter: Payer: Self-pay | Admitting: Cardiology

## 2020-03-06 ENCOUNTER — Ambulatory Visit: Payer: No Typology Code available for payment source | Admitting: Cardiology

## 2020-03-06 VITALS — BP 128/81 | HR 94 | Ht 60.0 in | Wt 110.0 lb

## 2020-03-06 DIAGNOSIS — I493 Ventricular premature depolarization: Secondary | ICD-10-CM | POA: Diagnosis not present

## 2020-03-06 DIAGNOSIS — R002 Palpitations: Secondary | ICD-10-CM | POA: Diagnosis not present

## 2020-03-06 DIAGNOSIS — R0602 Shortness of breath: Secondary | ICD-10-CM | POA: Diagnosis not present

## 2020-03-06 NOTE — Progress Notes (Signed)
Cardiology Office Note:    Date:  03/06/2020   ID:  Natalie Ford, DOB 19-Dec-1970, MRN [id number]  PCP:  Merri Brunette, MD  Cardiologist:  No primary care provider on file.  Electrophysiologist:  None   Referring MD: Merri Brunette, MD   Chief complaint: dyspnea/palpitations  History of Present Illness:    Natalie Ford is a 49 y.o. female with a hx of COVID-19 infection who presents for follow-up.  She was referred by Dr. Deanne Coffer for evaluation of dyspnea/palpitations, initially seen on 12/19/2019.  She was diagnosed with Covid in March 2020.  Following this had myalgias, shortness of breath, and palpitations.  She saw rheumatology and was diagnosed with seronegative rheumatoid arthritis.  Started on prednisone in September 2020.  She also did a trial of methotrexate but did not tolerate.  Also has been on leflunomide.  She was suspected of having a reinfection of COVID-19 in January 2021.  She developed pneumonia in February.  She states that since that time she has been having fevers daily.  She has also been short of breath.  Reports that she walks 20 minutes for 5 days/week, but is limited by dyspnea.  Denies any chest pain currently.  Also has been having palpitations.  Described as feeling like heart is racing.  Will happen multiple times per day, can last for hours.  Has checked her heart rate and will will be in 120s.  She was seen by ID for fevers of unknown origin, underwent CT abdomen/pelvis which was unremarkable.  Never smoked.  Mother died of MI in 86s, first had PCI in 51s.   Echocardiogram on 01/03/2020 showed EF 65 to 70%, normal RV function, no significant valvular disease.  Zio patch x3 days on 01/11/2020 showed occasional PVCs (1.9% of beats).  Since last clinic visit, she reports that she has been doing better.  She was seen by pulmonology and diagnosed with asthma and started on inhalers.  She continues to have low-grade fevers.  She continues to have shortness of breath.  She  denies any chest pain.  She is walking for 20-30 minutes at least twice per week.  She does report shortness of breath with this.  Continues to have palpitations, can occur daily and last for 1 to 2 minutes.  Feels like heart is racing during episodes.  Heart rate has been up to 120s at rest.  She drinks 1 cup of coffee in the morning, no other caffeine intake.  Rare alcohol use.   Past Medical History:  Diagnosis Date   Allergic rhinitis    COVID-19    Dyspepsia    Hypercholesteremia    IBS (irritable bowel syndrome)    Lumbosacral strain     Past Surgical History:  Procedure Laterality Date   CARPAL TUNNEL RELEASE Right    CESAREAN SECTION     x 2    Current Medications: Current Meds  Medication Sig   albuterol (VENTOLIN HFA) 108 (90 Base) MCG/ACT inhaler Inhale 1-2 puffs into the lungs every 6 (six) hours as needed for wheezing or shortness of breath.   buPROPion (WELLBUTRIN XL) 150 MG 24 hr tablet bupropion HCl XL 150 mg 24 hr tablet, extended release  TAKE 3 TABLETS BY MOUTH IN THE MORNING   cetirizine (ZYRTEC) 10 MG tablet Take 10 mg by mouth daily.   fluticasone (FLONASE) 50 MCG/ACT nasal spray Place 2 sprays into both nostrils daily.   fluticasone-salmeterol (ADVAIR HFA) 230-21 MCG/ACT inhaler Inhale 2 puffs into the lungs 2 (  two) times daily.   gabapentin (NEURONTIN) 300 MG capsule Take 300 mg by mouth 3 (three) times daily as needed.   ibuprofen (ADVIL,MOTRIN) 200 MG tablet Take 200 mg by mouth every 6 (six) hours as needed.   modafinil (PROVIGIL) 200 MG tablet Take 200 mg by mouth daily.   Multiple Vitamin (MULTIVITAMIN) capsule Take 1 capsule by mouth daily.    prednisoLONE 5 MG TABS tablet Take one and a half tablet by mouth (7.5 mg total) once daily     Allergies:   Penicillins   Social History   Socioeconomic History   Marital status: Divorced    Spouse name: Not on file   Number of children: 2   Years of education: Not on file    Highest education level: Not on file  Occupational History   Occupation: Charity fundraiser, Psychologist, forensic: [employer]   Occupation: Charity fundraiser, Scientist, research (physical sciences)    Comment: [employer]  Tobacco Use   Smoking status: Never Smoker   Smokeless tobacco: Never Used   Tobacco comment: exposed to second hand smoke  Vaping Use   Vaping Use: Never used  Substance and Sexual Activity   Alcohol use: Yes    Comment: social use   Drug use: No   Sexual activity: Not on file  Other Topics Concern   Not on file  Social History Narrative   Not on file   Social Determinants of Health   Financial Resource Strain:    Difficulty of Paying Living Expenses:   Food Insecurity:    Worried About Programme researcher, broadcasting/film/video in the Last Year:    Barista in the Last Year:   Transportation Needs:    Freight forwarder (Medical):    Lack of Transportation (Non-Medical):   Physical Activity:    Days of Exercise per Week:    Minutes of Exercise per Session:   Stress:    Feeling of Stress :   Social Connections:    Frequency of Communication with Friends and Family:    Frequency of Social Gatherings with Friends and Family:    Attends Religious Services:    Active Member of Clubs or Organizations:    Attends Engineer, structural:    Marital Status:      Family History: The patient's family history includes Diabetes in her mother; Heart disease in her mother; Kidney disease in her paternal aunt; Leukemia in her father; Stomach cancer in her paternal grandfather.  ROS:   Please see the history of present illness.     All other systems reviewed and are negative.  EKGs/Labs/Other Studies Reviewed:    The following studies were reviewed today:   EKG:  EKG is not ordered today.  The ekg ordered 12/19/19 demonstrates normal sinus rhythm, rate 85, no ST/T abnormalities  Recent Labs: 11/15/2019: ALT 14; BUN 11; Creat 0.87; Hemoglobin 14.1; Platelets 280; Potassium  3.9; Sodium 140  Recent Lipid Panel    Component Value Date/Time   CHOL 192 10/02/2017 1040   TRIG 45.0 10/02/2017 1040   TRIG 65 08/21/2006 0923   HDL 65.00 10/02/2017 1040   CHOLHDL 3 10/02/2017 1040   VLDL 9.0 10/02/2017 1040   LDLCALC 118 (H) 10/02/2017 1040   LDLDIRECT 123.2 07/13/2007 0907    Physical Exam:    VS:  BP 128/81    Pulse 94    Ht 5' (1.524 m)    Wt 110 lb (49.9 kg)    BMI  21.48 kg/m     Wt Readings from Last 3 Encounters:  03/06/20 110 lb (49.9 kg)  01/26/20 111 lb 9.6 oz (50.6 kg)  12/20/19 107 lb 3.2 oz (48.6 kg)     GEN:  Well nourished, well developed in no acute distress HEENT: Normal NECK: No JVD LYMPHATICS: No lymphadenopathy CARDIAC: RRR, no murmurs, rubs, gallops RESPIRATORY:  Clear to auscultation without rales, wheezing or rhonchi  ABDOMEN: Soft, non-tender, non-distended MUSCULOSKELETAL:  No edema SKIN: Warm and dry NEUROLOGIC:  Alert and oriented x 3 PSYCHIATRIC:  Normal affect   ASSESSMENT:    1. Shortness of breath   2. Palpitations   3. PVCs (premature ventricular contractions)    PLAN:    Dyspnea: Given persistent symptoms since COVID-19 infection, echocardiogram ordered to evaluate for myocardial involvement.  Echocardiogram on 01/03/2020 showed EF 65 to 70%, normal RV function, no significant valvular disease.  Palpitations/PVCs: Reported palpitations, Zio patch x3 days on 01/11/2020 showed occasional PVCs (1.9% of beats).  No evidence of structural heart disease as above.  No further cardiac work-up recommended.  RTC in 1 year   Medication Adjustments/Labs and Tests Ordered: Current medicines are reviewed at length with the patient today.  Concerns regarding medicines are outlined above.  No orders of the defined types were placed in this encounter.  No orders of the defined types were placed in this encounter.   Patient Instructions  Medication Instructions:  Your physician recommends that you continue on your current  medications as directed. Please refer to the Current Medication list given to you today.  Follow-Up: At [facility], you and your health needs are our priority.  As part of our continuing mission to provide you with exceptional heart care, we have created designated Provider Care Teams.  These Care Teams include your primary Cardiologist (physician) and Advanced Practice Providers (APPs -  Physician Assistants and Nurse Practitioners) who all work together to provide you with the care you need, when you need it.  We recommend signing up for the patient portal called "MyChart".  Sign up information is provided on this After Visit Summary.  MyChart is used to connect with patients for Virtual Visits (Telemedicine).  Patients are able to view lab/test results, encounter notes, upcoming appointments, etc.  Non-urgent messages can be sent to your provider as well.   To learn more about what you can do with MyChart, go to [email or web address].    Your next appointment:   12 month(s)  The format for your next appointment:   In Person  Provider:   Epifanio Lesches, MD        Signed, Little Ishikawa, MD  03/06/2020 5:30 PM    [facility]

## 2020-03-06 NOTE — Patient Instructions (Signed)

## 2020-03-14 ENCOUNTER — Ambulatory Visit: Payer: Self-pay | Admitting: *Deleted

## 2020-03-14 ENCOUNTER — Telehealth: Payer: Self-pay

## 2020-03-14 ENCOUNTER — Telehealth: Payer: Self-pay | Admitting: Internal Medicine

## 2020-03-14 NOTE — Telephone Encounter (Signed)
Patient states she has had COVID twice and has been seen at post COVID clinic before.Patient is seeing infectious disease, pulmonary and cardiology for post COVID symptoms. Symptoms vary from day to day.   Patient is waiting to get into recovery clinic at [facility] ( September)- and they suggest patient be seen again at post COVID clinic while she is waiting for that appointment. She is needing recheck/follow up appointment. Attempted to call office- but could not get through this morning. Sending message for them to contact patient for possible follow up.  Reason for Disposition . Requesting regular office appointment  Protocols used: INFORMATION ONLY CALL - NO TRIAGE-A-AH

## 2020-03-14 NOTE — Telephone Encounter (Signed)
Patient to be scheduled

## 2020-03-14 NOTE — Telephone Encounter (Signed)
Patient called to update provider on health. Patient reportedly took fluvoxamine two times however was not able to tolerate it either time. Reports that she felt worse both times after taking (increased fatigue). Reports that she does still have fevers daily however they aren't ever over 101 consistently. Patient also wanted to know if she should have her antibodies checked. Unable to be seen at Post COVID clinic at [facility] until 9/10 (earliest available).   Harace Mccluney Loyola Mast, RN

## 2020-03-14 NOTE — Telephone Encounter (Signed)
Pt was contacted to make appt w/ [facility]

## 2020-03-15 ENCOUNTER — Other Ambulatory Visit: Payer: Self-pay

## 2020-03-15 NOTE — Telephone Encounter (Signed)
Can you mark on her list of medications that she is no longer taking it. I don't think she needs to do antibody testing at this time. I would wait til she gets to her appointment at [facility] to see if they do a battery of tests.

## 2020-03-15 NOTE — Telephone Encounter (Signed)
Sent patient [facility] message about Dr. Feliz Beam advise.   Erinn Mendosa Loyola Mast, RN

## 2020-03-30 ENCOUNTER — Telehealth: Payer: Self-pay

## 2020-03-30 NOTE — Telephone Encounter (Signed)
Please schedule e-visit with Dr. Drue Second for exemption documentation; I can't adjust the schedule since she doesn't have clinic.   Thanks! Rudolfo Brandow R Chares Slaymaker

## 2020-03-30 NOTE — Telephone Encounter (Signed)
Patient sent MyChart message expressing the following concern:  "I also have a concern, as part of [facility] as you know the Covid vaccine is mandatory. With my continued fevers I still don't feel comfortable getting the vaccine yet till I see [facility] on 05/04/2020.  I do need an exception signed by 04/13/2020.  Is that something I can get her to sign? "  Forwarding to provider for advice/letter of exemption. Shanara Schnieders Loyola Mast, RN

## 2020-03-30 NOTE — Telephone Encounter (Signed)
Can we schedule a visit to get the paperwork sign. It will have to be an official televisit though. Can we do it closer to the end of next week. Like thur or fri

## 2020-04-10 MED FILL — ADVAIR HFA 230-21 MCG INH: 230-21 | 30 days supply | Qty: 12 | Fill #0

## 2020-05-15 ENCOUNTER — Other Ambulatory Visit: Payer: Self-pay

## 2020-05-15 ENCOUNTER — Ambulatory Visit: Payer: No Typology Code available for payment source | Attending: Family Medicine | Admitting: Speech Pathology

## 2020-05-15 ENCOUNTER — Encounter: Payer: Self-pay | Admitting: Speech Pathology

## 2020-05-15 DIAGNOSIS — R4701 Aphasia: Secondary | ICD-10-CM | POA: Insufficient documentation

## 2020-05-15 DIAGNOSIS — R41841 Cognitive communication deficit: Secondary | ICD-10-CM | POA: Diagnosis not present

## 2020-05-16 NOTE — Addendum Note (Signed)
Addended by: Arlana Lindau on: 05/16/2020 09:16 AM   Modules accepted: Orders

## 2020-05-16 NOTE — Therapy (Signed)
[facility] [address] [place], [place], [address] Phone: [phone]   Fax:  [phone]  Speech Language Pathology Evaluation  Patient Details  Name: Natalie Ford MRN: [id number] Date of Birth: [birth year] Referring Provider (SLP): Tillman Sers, FNP   Encounter Date: 05/15/2020   End of Session - 05/16/20 0911    Visit Number 1    Number of Visits 13    Date for SLP Re-Evaluation 07/14/20    Authorization Type [employer]- [place]    SLP Start Time 1700    SLP Stop Time  1759    SLP Time Calculation (min) 59 min    Activity Tolerance Patient tolerated treatment well           Past Medical History:  Diagnosis Date  . Allergic rhinitis   . COVID-19   . Dyspepsia   . Hypercholesteremia   . IBS (irritable bowel syndrome)   . Lumbosacral strain     Past Surgical History:  Procedure Laterality Date  . CARPAL TUNNEL RELEASE Right   . CESAREAN SECTION     x 2    There were no vitals filed for this visit.   Subjective Assessment - 05/15/20 1704    Subjective "I feel like I'm all over the place sometimes."    Currently in Pain? Yes    Pain Score 7     Pain Location Other (Comment)   generalized body aches   Pain Descriptors / Indicators Aching              SLP Evaluation [facility] - 05/15/20 1704      SLP Visit Information   SLP Received On 05/15/20    Referring Provider (SLP) Tillman Sers, FNP    Onset Date referral 05/09/20   covid-19 in January 2021   Medical Diagnosis personal history of covid-19      Subjective   Patient/Family Stated Goal improve memory, organization      General Information   HPI Natalie Ford is a 49 y.o. female with history of rheumatoid arthritis who had covid 19 infection, persistent dyspnea and cough. 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    Behavioral/Cognition alert,  pleasant    Mobility Status ambulated unassisted      Balance Screen   Has the patient fallen in the past 6 months No    Has the patient had a decrease in activity level because of a fear of falling?  No    Is the patient reluctant to leave their home because of a fear of falling?  No      Prior Functional Status   Cognitive/Linguistic Baseline Within functional limits    Type of Home House     Lives With Spouse;Son    Available Support Family    Education BSN    Vocation Full time employment   RN     Cognition   Overall Cognitive Status Impaired/Different from baseline    Area of Impairment Attention;Memory    Current Attention Level Selective    Attention Comments difficulty alternating  between computer screens at work    Memory Decreased short-term memory    Memory Comments forgetting names of medications, to complete some home tasks such as sending in paperwork for her son    Attention Alternating    Alternating Attention Impaired    Alternating Attention Impairment Functional complex    Memory Impaired  Memory Impairment Storage deficit;Decreased recall of new information;Prospective memory    Awareness Appears intact    Problem Solving Impaired    Problem Solving Impairment --   slower processing   TEFL teacher --   reports functional deficits     Auditory Comprehension   Overall Auditory Comprehension Appears within functional limits for tasks assessed      Visual Recognition/Discrimination   Discrimination Within Function Limits      Reading Comprehension   Reading Status Within funtional limits      Expression   Primary Mode of Expression Verbal      Verbal Expression   Overall Verbal Expression Impaired    Level of Generative/Spontaneous Verbalization Conversation    Naming No impairment    Pragmatics No impairment    Other Verbal Expression Comments reports wordfinding difficulties in conversation; none noted in session  today      Oral Motor/Sensory Function   Overall Oral Motor/Sensory Function Other (comment)   not formally assessed; no dysarthria present     Motor Speech   Overall Motor Speech Appears within functional limits for tasks assessed      Standardized Assessments   Standardized Assessments  Cognitive Linguistic Quick Test      Cognitive Linguistic Quick Test (Ages 18-69)   Attention WNL   193/215   Memory WNL   170/185   Executive Function WNL   28/40 clock drawing 12/13   Language WNL   32/37   Visuospatial Skills WNL   94/105   Severity Rating Total 20    Composite Severity Rating 4                           SLP Education - 05/16/20 0904    Education Details attention/memory compensations, proposed therapy goals    Person(s) Educated Patient    Methods Explanation    Comprehension Verbalized understanding            SLP Short Term Goals - 05/15/20 1804      SLP SHORT TERM GOAL #1   Title pt will ID memory and organization system that is helpful for her and bring to 3 sessions    Time 3    Period Weeks    Status New      SLP SHORT TERM GOAL #2   Title pt will tell SLP 3 memory/attention compensations she can use to improve her recall/efficiency in documentation at work    Time 3    Period Weeks    Status New            SLP Long Term Goals - 05/15/20 1803      SLP LONG TERM GOAL #1   Title pt will use compensations for anomia in 15 minutes mod complex-complex conversation x 3 visits.    Time 6    Period Weeks    Status New      SLP LONG TERM GOAL #2   Title Pt will report carryover of comps for attention and memory at work x 3 visits    Time 6    Period Weeks    Status New      SLP LONG TERM GOAL #3   Title use executive function system for organization/planning of work and home responsibilities    Time 6    Period Weeks    Status New            Plan -  05/16/20 0904    Clinical Impression Statement Natalie Ford is a  pleasant, 49 y.o. female who presents with mild cognitive communication impairment and wordfinding difficulties s/p 2 Covid-19 infections. Her cognitive testing scores (CLQT) are WNL for age, however functional deficits are noted. She reports increasing difficulties with cognition since her 2nd illness in January 2021. 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 She continues to work full-time, but has noted memory and attention deficits which make it more difficult and time-consuming to complete her job. She describes herself as an organized and "list-focused" person, however has increased difficulty managing and coordinating tasks between home and work. I recommend skilled ST to train pt in compensations and strategies to improve her attention, recall and organization skills in order to improve function at work and home.    Speech Therapy Frequency 2x / week    Duration --   6 weeks or 13 total visits   Treatment/Interventions Language facilitation;Environmental controls;SLP instruction and feedback;Cognitive reorganization;Compensatory techniques;Functional tasks;Compensatory strategies;Internal/external aids;Multimodal communcation approach;Patient/family education    Potential to Achieve Goals Good    Consulted and Agree with Plan of Care Patient           Patient will benefit from skilled therapeutic intervention in order to improve the following deficits and impairments:   Cognitive communication deficit  Aphasia    Problem List Patient Active Problem List   Diagnosis Date Noted  . History of COVID-19 11/01/2019  . FUO (fever of unknown origin) 11/01/2019  . Cough 11/01/2019  . Shoulder impingement syndrome, left 06/22/2018  . Left leg pain 11/18/2017  . Carpal tunnel syndrome 10/01/2017  . Kidney stones  03/30/2014  . Physical exam, annual 06/29/2013  . Anxiety 06/29/2013  . Borderline hypercholesterolemia 05/14/2010  . DYSPEPSIA 05/14/2010  . LUMBOSACRAL STRAIN 05/14/2010  . Allergic rhinitis 05/15/2009  . UPPER RESPIRATORY INFECTION 05/09/2009  . GASTROENTERITIS 12/25/2007  . IRRITABLE BOWEL SYNDROME 12/25/2007   Rondel Baton, MS, CCC-SLP Speech-Language Pathologist  Arlana Lindau 05/16/2020, 9:12 AM  [facility] [address] [place], [place], [address] Phone: [phone]   Fax:  [phone]  Name: Natalie Ford MRN: [id number] Date of Birth: 1971/03/31

## 2020-05-22 ENCOUNTER — Other Ambulatory Visit: Payer: Self-pay

## 2020-05-22 ENCOUNTER — Ambulatory Visit: Payer: No Typology Code available for payment source | Admitting: Speech Pathology

## 2020-05-22 DIAGNOSIS — R41841 Cognitive communication deficit: Secondary | ICD-10-CM

## 2020-05-22 NOTE — Patient Instructions (Addendum)
Try using SmartPhrases in Epic; set up your "frequently" used narratives or phrases, your signature, even messages you send to other providers.  Use the Sticky Notes app or feature within Epic to take notes while in a patient's chart; then you will have the data for quick reference or your documentation on a different screen.  Alert your coworkers when you need to focus so that you can reduce interruptions. Close your door, put a sign on your door or whiteboard (share what times you are available for non-urgent interruptions).   At home: "serial" task vs multitasking. Make a checklist of your priorities (in planner or on your phone), set a timer for 20-30 minutes and see if you can work on that one task without stopping.  Tips for Wordfinding  . Say one thing at a time . Don't  rush - slow down, be patient . Talk face to face . Reduce background noise . Relax - be natural . Use pen and paper . Write down key words . Draw diagrams or pictures   Describing words  What group does it belong to?  What do I use it for?  Where can I find it?  What does it LOOK like?  What other words go with it?  What is the 1st sound of the word?   Many Ways to Communicate  Describe it Write it Draw it Gesture it Use related words

## 2020-05-23 NOTE — Therapy (Signed)
[facility] [address] [place], [place], [address] Phone: [phone]   Fax:  [phone]  Speech Language Pathology Treatment  Patient Details  Name: Natalie Ford MRN: [id number] Date of Birth: 01-02-1971 Referring Provider (SLP): Tillman Sers, FNP   Encounter Date: 05/22/2020   End of Session - 05/22/20 1315    Visit Number 2    Number of Visits 13    Date for SLP Re-Evaluation 07/14/20    Authorization Type [employer]- [place]    SLP Start Time 1150    SLP Stop Time  1230    SLP Time Calculation (min) 40 min    Activity Tolerance Patient tolerated treatment well           Past Medical History:  Diagnosis Date  . Allergic rhinitis   . COVID-19   . Dyspepsia   . Hypercholesteremia   . IBS (irritable bowel syndrome)   . Lumbosacral strain     Past Surgical History:  Procedure Laterality Date  . CARPAL TUNNEL RELEASE Right   . CESAREAN SECTION     x 2    There were no vitals filed for this visit.   Subjective Assessment - 05/22/20 1154    Subjective "It's been a busy morning."    Currently in Pain? Yes    Pain Score 6     Pain Location --   generalized                ADULT SLP TREATMENT - 05/23/20 1120      General Information   Behavior/Cognition Alert;Cooperative;Pleasant mood      Treatment Provided   Treatment provided Cognitive-Linquistic      Pain Assessment   Pain Assessment No/denies pain      Cognitive-Linquistic Treatment   Treatment focused on Cognition    Skilled Treatment Patient brought her work Pensions consultant with her today. SLP focused session on training in strategies for attention, memory and organization, as well as compensations for work and wordfinding. Worked with pt to ID strategies she can use at work to reduce distrations, improve recall and efficiency. See pt instructions for details. Pt took notes in her planner during the session.       Assessment /  Recommendations / Plan   Plan Continue with current plan of care      Progression Toward Goals   Progression toward goals Progressing toward goals            SLP Education - 05/23/20 1121    Education Details compensations for anomia, strategies for attention/recall at work    [employer]) Educated Patient    Methods Explanation;Handout    Comprehension Verbalized understanding;Need further instruction            SLP Short Term Goals - 05/22/20 1156      SLP SHORT TERM GOAL #1   Title pt will ID memory and organization system that is helpful for her and bring to 3 sessions    Time 3    Period Weeks    Status On-going      SLP SHORT TERM GOAL #2   Title pt will tell SLP 3 memory/attention compensations she can use to improve her recall/efficiency in documentation at work    Time 3    Period Weeks    Status On-going            SLP Long Term Goals - 05/22/20 1156      SLP LONG TERM GOAL #1  Title pt will use compensations for anomia in 15 minutes mod complex-complex conversation x 3 visits.    Time 6    Period Weeks    Status On-going      SLP LONG TERM GOAL #2   Title Pt will report carryover of comps for attention and memory at work x 3 visits    Time 6    Period Weeks    Status On-going      SLP LONG TERM GOAL #3   Title use executive function system for organization/planning of work and home responsibilities    Time 6    Period Weeks    Status On-going            Plan - 05/22/20 1316    Clinical Impression Statement Natalie Ford is a pleasant, 49 y.o. female who presents with mild cognitive communication impairment and wordfinding difficulties s/p 2 Covid-19 infections. Her cognitive testing scores (CLQT) are WNL for age, however functional deficits are noted. She reports increasing difficulties with cognition since her 2nd illness in January 2021. 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 She continues to work full-time, but has noted memory and attention deficits which make it more difficult and time-consuming to complete her job. She describes herself as an organized and "list-focused" person, however has increased difficulty managing and coordinating tasks between home and work. I recommend skilled ST to train pt in compensations and strategies to improve her attention, recall and organization skills in order to improve function at work and home.    Speech Therapy Frequency 2x / week    Duration --   6 weeks or 13 total visits   Treatment/Interventions Language facilitation;Environmental controls;SLP instruction and feedback;Cognitive reorganization;Compensatory techniques;Functional tasks;Compensatory strategies;Internal/external aids;Multimodal communcation approach;Patient/family education    Potential to Achieve Goals Good    Consulted and Agree with Plan of Care Patient           Patient will benefit from skilled therapeutic intervention in order to improve the following deficits and impairments:   Cognitive communication deficit    Problem List Patient Active Problem List   Diagnosis Date Noted  . History of COVID-19 11/01/2019  . FUO (fever of unknown origin) 11/01/2019  . Cough 11/01/2019  . Shoulder impingement syndrome, left 06/22/2018  . Left leg pain 11/18/2017  . Carpal tunnel syndrome 10/01/2017  . Kidney stones 03/30/2014  . Physical exam, annual 06/29/2013  . Anxiety 06/29/2013  . Borderline hypercholesterolemia 05/14/2010  . DYSPEPSIA 05/14/2010  . LUMBOSACRAL STRAIN 05/14/2010  . Allergic rhinitis 05/15/2009  . UPPER RESPIRATORY INFECTION 05/09/2009  . GASTROENTERITIS 12/25/2007  . IRRITABLE BOWEL SYNDROME 12/25/2007   Rondel Baton, MS, CCC-SLP Speech-Language Pathologist  Arlana Lindau 05/23/2020, 11:22 AM  [facility] [address] [place], [place], [address] Phone: [phone]   Fax:  [phone]   Name: Jodilyn Giese MRN: [id number] Date of Birth: 1971/02/25

## 2020-05-24 ENCOUNTER — Ambulatory Visit: Payer: No Typology Code available for payment source | Admitting: Speech Pathology

## 2020-05-24 ENCOUNTER — Other Ambulatory Visit: Payer: Self-pay

## 2020-05-24 DIAGNOSIS — R41841 Cognitive communication deficit: Secondary | ICD-10-CM | POA: Diagnosis not present

## 2020-05-24 NOTE — Therapy (Signed)
[facility] [address] [place], [place], [address] Phone: [phone]   Fax:  [phone]  Speech Language Pathology Treatment  Patient Details  Name: Natalie Ford MRN: [id number] Date of Birth: [birth year] Referring Provider (SLP): Tillman Sers, FNP   Encounter Date: 05/24/2020   End of Session - 05/24/20 1347    Visit Number 3    Number of Visits 13    Date for SLP Re-Evaluation 07/14/20    Authorization [place] [employer]    SLP Start Time 1148    SLP Stop Time  1228    SLP Time Calculation (min) 40 min    Activity Tolerance Patient tolerated treatment well           Past Medical History:  Diagnosis Date  . Allergic rhinitis   . COVID-19   . Dyspepsia   . Hypercholesteremia   . IBS (irritable bowel syndrome)   . Lumbosacral strain     Past Surgical History:  Procedure Laterality Date  . CARPAL TUNNEL RELEASE Right   . CESAREAN SECTION     x 2    There were no vitals filed for this visit.   Subjective Assessment - 05/24/20 1150    Subjective "I'm not feeling good today."    Currently in Pain? Yes    Pain Score 9     Pain Location --   generalized                ADULT SLP TREATMENT - 05/24/20 1152      General Information   Behavior/Cognition Alert;Cooperative;Pleasant mood      Treatment Provided   Treatment provided Cognitive-Linquistic      Pain Assessment   Pain Assessment No/denies pain      Cognitive-Linquistic Treatment   Treatment focused on Cognition    Skilled Treatment Pt followed up with several suggestions from previous session. She has begun setting up smart phrases in EP[employer], as well as beginning to use her planner to organize both home and work information. We discussed organization and attention strategies that will help pt compensate in her day-to-day work flow, such as categorizing messages, using timers to alert her for when it's time for task switching, and  organizing information/tasks by type to reduce need for switching between forms. See pt instructions for details.      Assessment / Recommendations / Plan   Plan Continue with current plan of care      Progression Toward Goals   Progression toward goals Progressing toward goals            SLP Education - 05/24/20 1347    Education Details compensations for attention, recall, organization/time management    Person(s) Educated Patient    Methods Explanation;Handout    Comprehension Verbalized understanding            SLP Short Term Goals - 05/24/20 1348      SLP SHORT TERM GOAL #1   Title pt will ID memory and organization system that is helpful for her and bring to 3 sessions    Baseline 05/24/20    Time 3    Period Weeks    Status On-going      SLP SHORT TERM GOAL #2   Title pt will tell SLP 3 memory/attention compensations she can use to improve her recall/efficiency in documentation at work    Time 3    Period Weeks    Status On-going  SLP Long Term Goals - 05/24/20 1348      SLP LONG TERM GOAL #1   Title pt will use compensations for anomia in 15 minutes mod complex-complex conversation x 3 visits.    Time 6    Period Weeks    Status On-going      SLP LONG TERM GOAL #2   Title Pt will report carryover of comps for attention and memory at work x 3 visits    Time 6    Period Weeks    Status On-going      SLP LONG TERM GOAL #3   Title use executive function system for organization/planning of work and home responsibilities    Time 6    Period Weeks    Status On-going            Plan - 05/24/20 1348    Clinical Impression Statement Edita Ford is a pleasant, 49 y.o. female who presents with mild cognitive communication impairment and wordfinding difficulties s/p 2 Covid-19 infections. Her cognitive testing scores (CLQT) are WNL for age, however functional deficits are noted. She reports increasing difficulties with cognition since her 2nd  illness in January 2021. 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 She continues to work full-time, but has noted memory and attention deficits which make it more difficult and time-consuming to complete her job. She describes herself as an organized and "list-focused" person, however has increased difficulty managing and coordinating tasks between home and work. I recommend skilled ST to train pt in compensations and strategies to improve her attention, recall and organization skills in order to improve function at work and home.    Speech Therapy Frequency 2x / week    Duration --   6 weeks or 13 total visits   Treatment/Interventions Language facilitation;Environmental controls;SLP instruction and feedback;Cognitive reorganization;Compensatory techniques;Functional tasks;Compensatory strategies;Internal/external aids;Multimodal communcation approach;Patient/family education    Potential to Achieve Goals Good    Consulted and Agree with Plan of Care Patient           Patient will benefit from skilled therapeutic intervention in order to improve the following deficits and impairments:   Cognitive communication deficit    Problem List Patient Active Problem List   Diagnosis Date Noted  . History of COVID-19 11/01/2019  . FUO (fever of unknown origin) 11/01/2019  . Cough 11/01/2019  . Shoulder impingement syndrome, left 06/22/2018  . Left leg pain 11/18/2017  . Carpal tunnel syndrome 10/01/2017  . Kidney stones 03/30/2014  . Physical exam, annual 06/29/2013  . Anxiety 06/29/2013  . Borderline hypercholesterolemia 05/14/2010  . DYSPEPSIA 05/14/2010  . LUMBOSACRAL STRAIN 05/14/2010  . Allergic rhinitis 05/15/2009  . UPPER RESPIRATORY INFECTION 05/09/2009  . GASTROENTERITIS 12/25/2007  . IRRITABLE BOWEL SYNDROME  12/25/2007   Rondel Baton, MS, CCC-SLP Speech-Language Pathologist  Arlana Lindau 05/24/2020, 1:49 PM  [facility] [address] [place], [place], [address] Phone: [phone]   Fax:  [phone]   Name: Danielle Mink MRN: [id number] Date of Birth: Jan 08, 1971

## 2020-05-24 NOTE — Patient Instructions (Signed)
Think about how you might be able to organize your lists to help you be more efficient  Can you keep the same insurance companies for prior authorization together, so that you can focus on one type/form at a time?  Make a list of different "flags" that might be helpful for your messages, and see about reaching out to an Epic superuser or IT to see a possible easier way to organize and manage your messages  To keep yourself on track, set a timer on your phone to let you know when it's time to stop working on prior authorizations and switch to the next task.

## 2020-05-29 ENCOUNTER — Other Ambulatory Visit: Payer: Self-pay

## 2020-05-29 ENCOUNTER — Ambulatory Visit: Payer: No Typology Code available for payment source | Attending: Family Medicine | Admitting: Speech Pathology

## 2020-05-29 DIAGNOSIS — R41841 Cognitive communication deficit: Secondary | ICD-10-CM | POA: Insufficient documentation

## 2020-05-29 DIAGNOSIS — R4701 Aphasia: Secondary | ICD-10-CM | POA: Insufficient documentation

## 2020-05-29 NOTE — Patient Instructions (Signed)
Schedule your more demanding/taxing paperwork for when you are at your best (try FMLAs in the morning, before time gets away from you).  Make a plan of action for how and when you will take care of certain tasks. Write it down, keep a list, cross things off as you go. This will help save you some mental space and energy. Schedule these things in your planner.  If you get interrupted in the middle of charting, write a note to yourself about where you left off.   Make 2 lists: office work and home work, bring this with you next time.

## 2020-05-29 NOTE — Therapy (Signed)
[facility] [address] [place], [place], [address] Phone: [phone]   Fax:  [phone]  Speech Language Pathology Treatment  Patient Details  Name: Natalie Ford MRN: [id number] Date of Birth: [birth year] Referring Provider (SLP): Tillman Sers, FNP   Encounter Date: 05/29/2020   End of Session - 05/29/20 1558    Visit Number 4    Number of Visits 13    Date for SLP Re-Evaluation 07/14/20    Authorization [place] [employer]    SLP Start Time 1403    SLP Stop Time  1445    SLP Time Calculation (min) 42 min    Activity Tolerance Patient tolerated treatment well           Past Medical History:  Diagnosis Date  . Allergic rhinitis   . COVID-19   . Dyspepsia   . Hypercholesteremia   . IBS (irritable bowel syndrome)   . Lumbosacral strain     Past Surgical History:  Procedure Laterality Date  . CARPAL TUNNEL RELEASE Right   . CESAREAN SECTION     x 2    There were no vitals filed for this visit.   Subjective Assessment - 05/29/20 1410    Subjective "I did set my thing for one hour."    Currently in Pain? Yes                 ADULT SLP TREATMENT - 05/29/20 1423      General Information   Behavior/Cognition Alert;Cooperative;Pleasant mood      Treatment Provided   Treatment provided Cognitive-Linquistic      Pain Assessment   Pain Assessment No/denies pain      Cognitive-Linquistic Treatment   Treatment focused on Cognition    Skilled Treatment Patient forgot her planner today. She decided on using a little time on Saturday morning to get set for the next week, instead of leaving this for Sunday night. Using smartphrases has helped her with her work. We continued generating strategies such as preplanning her day, and designating separate lists for home tasks and office tasks to help her structure her day and use her time more efficiently. Also educated on scheduling tasks requiring more  concentration for times of the day when she is most focused, in the mornings. See pt instructions for additional details. Pt to bring lists of home/office tasks and her planner next session.       Assessment / Recommendations / Plan   Plan Continue with current plan of care      Progression Toward Goals   Progression toward goals Progressing toward goals            SLP Education - 05/29/20 1557    Education Details compensations and strategies for attention, recall, organization, time management    Person(s) Educated Patient    Methods Explanation    Comprehension Verbalized understanding            SLP Short Term Goals - 05/29/20 1600      SLP SHORT TERM GOAL #1   Title pt will ID memory and organization system that is helpful for her and bring to 3 sessions    Baseline 05/24/20    Time 2    Period Weeks    Status On-going      SLP SHORT TERM GOAL #2   Title pt will tell SLP 3 memory/attention compensations she can use to improve her recall/efficiency in documentation at work    Time 2  Period Weeks    Status On-going            SLP Long Term Goals - 05/29/20 1600      SLP LONG TERM GOAL #1   Title pt will use compensations for anomia in 15 minutes mod complex-complex conversation x 3 visits.    Time 5    Period Weeks    Status On-going      SLP LONG TERM GOAL #2   Title Pt will report carryover of comps for attention and memory at work x 3 visits    Time 5    Period Weeks    Status On-going      SLP LONG TERM GOAL #3   Title use executive function system for organization/planning of work and home responsibilities    Time 5    Period Weeks    Status On-going            Plan - 05/29/20 1558    Clinical Impression Statement Natalie Ford is a pleasant, 49 y.o. female who presents with mild cognitive communication impairment and wordfinding difficulties s/p 2 Covid-19 infections. She reports increasing difficulties with cognition since her 2nd  illness in January 2021. 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 She continues to work full-time, but has noted memory and attention deficits which make it more difficult and time-consuming to complete her job. She is beginning to implement strategies for attention, recall, and organization; requires ongoing training for consistent carryover. I recommend skilled ST to train pt in compensations and strategies to improve her attention, recall and organization skills in order to improve function at work and home.    Speech Therapy Frequency 2x / week    Duration --   6 weeks or 13 total visits   Treatment/Interventions Language facilitation;Environmental controls;SLP instruction and feedback;Cognitive reorganization;Compensatory techniques;Functional tasks;Compensatory strategies;Internal/external aids;Multimodal communcation approach;Patient/family education    Potential to Achieve Goals Good    Consulted and Agree with Plan of Care Patient           Patient will benefit from skilled therapeutic intervention in order to improve the following deficits and impairments:   Cognitive communication deficit    Problem List Patient Active Problem List   Diagnosis Date Noted  . History of COVID-19 11/01/2019  . FUO (fever of unknown origin) 11/01/2019  . Cough 11/01/2019  . Shoulder impingement syndrome, left 06/22/2018  . Left leg pain 11/18/2017  . Carpal tunnel syndrome 10/01/2017  . Kidney stones 03/30/2014  . Physical exam, annual 06/29/2013  . Anxiety 06/29/2013  . Borderline hypercholesterolemia 05/14/2010  . DYSPEPSIA 05/14/2010  . LUMBOSACRAL STRAIN 05/14/2010  . Allergic rhinitis 05/15/2009  . UPPER RESPIRATORY INFECTION 05/09/2009  . GASTROENTERITIS 12/25/2007  . IRRITABLE BOWEL SYNDROME 12/25/2007   Rondel Baton, MS, CCC-SLP Speech-Language Pathologist  Arlana Lindau 05/29/2020, 4:01 PM  [facility] [address] [place], [place], [address] Phone: [phone]   Fax:  [phone]   Name: Natalie Ford MRN: [id number] Date of Birth: August 22, 1971

## 2020-05-30 ENCOUNTER — Other Ambulatory Visit (HOSPITAL_COMMUNITY): Payer: Self-pay | Admitting: Internal Medicine

## 2020-05-30 ENCOUNTER — Encounter: Payer: Self-pay | Admitting: Pharmacist

## 2020-05-30 ENCOUNTER — Ambulatory Visit (HOSPITAL_BASED_OUTPATIENT_CLINIC_OR_DEPARTMENT_OTHER): Payer: No Typology Code available for payment source | Admitting: Pharmacist

## 2020-05-30 DIAGNOSIS — Z79899 Other long term (current) drug therapy: Secondary | ICD-10-CM

## 2020-05-30 MED ORDER — HUMIRA 40 MG/0.8ML ~~LOC~~ PSKT: PREFILLED_SYRINGE | SUBCUTANEOUS | 2 refills | Status: DC

## 2020-05-30 NOTE — Progress Notes (Signed)
  S: Patient presents for review of their specialty medication therapy.  Patient is currently prescribed Humira for RA . Patient is managed by Dr. Deanne Coffer for this.   Adherence: has not yet started   Efficacy: has not yet started   Dosing:  Rheumatoid arthritis: SubQ: 40 mg every other week (may continue methotrexate, other nonbiologic DMARDS, corticosteroids, NSAIDs, and/or analgesics); patients not taking concomitant methotrexate may increase dose to 40 mg every weekDose adjustments: Renal: no dose adjustments (has not been studied) Hepatic: no dose adjustments (has not been studied)  Drug-drug interactions: none identified  Screening: TB test: completed per pt  Hepatitis: completed per pt  Monitoring: S/sx of infection: none  CBC: WNL  S/sx of hypersensitivity: none  S/sx of malignancy: none  S/sx of heart failure: none   Other side effects: none   O:     Lab Results  Component Value Date   WBC 8.7 11/15/2019   HGB 14.1 11/15/2019   HCT 40.7 11/15/2019   MCV 96.0 11/15/2019   PLT 280 11/15/2019      Chemistry      Component Value Date/Time   NA 140 11/15/2019 1546   K 3.9 11/15/2019 1546   CL 105 11/15/2019 1546   CO2 26 11/15/2019 1546   BUN 11 11/15/2019 1546   CREATININE 0.87 11/15/2019 1546      Component Value Date/Time   CALCIUM 9.6 11/15/2019 1546   ALKPHOS 33 (L) 03/21/2019 1310   AST 14 11/15/2019 1546   ALT 14 11/15/2019 1546   BILITOT 0.4 11/15/2019 1546       A/P: 1. Medication review: Patient currently prescribed Humira for RA. Reviewed the medication with the patient, including the following: Humira is a TNF blocking agent indicated for ankylosing spondylitis, Crohn's disease, Hidradenitis suppurativa, psoriatic arthritis, plaque psoriasis, ulcerative colitis, and uveitis. Patient educated on purpose, proper use and potential adverse effects of Humira. Possible adverse effects are increased risk of infections, headache, and injection site  reactions. There is the possibility of an increased risk of malignancy but it is not well understood if this increased risk is due to there medication or the disease state. There are rare cases of pancytopenia and aplastic anemia. For SubQ injection at separate sites in the thigh or lower abdomen (avoiding areas within 2 inches of navel); rotate injection sites. May leave at room temperature for ~15 to 30 minutes prior to use; do not remove cap or cover while allowing product to reach room temperature. Do not use if solution is discolored or contains particulate matter. Do not administer to skin which is red, tender, bruised, hard, or that has scars, stretch marks, or psoriasis plaques. Needle cap of the prefilled syringe or needle cover for the adalimumab pen may contain latex. Prefilled pens and syringes are available for use by patients and the full amount of the syringe should be injected (self-administration); the vial is intended for institutional use only. Vials do not contain a preservative; discard unused portion. No recommendations for any changes at this time.   Butch Penny, PharmD, CPP Clinical Pharmacist [facility] [phone]

## 2020-05-31 MED FILL — HUMIRA 40 MG/0.8ML PSKT: 40 | 28 days supply | Qty: 2 | Fill #0

## 2020-06-05 ENCOUNTER — Ambulatory Visit: Payer: No Typology Code available for payment source | Admitting: Speech Pathology

## 2020-06-05 ENCOUNTER — Other Ambulatory Visit: Payer: Self-pay

## 2020-06-05 DIAGNOSIS — R41841 Cognitive communication deficit: Secondary | ICD-10-CM

## 2020-06-05 MED FILL — ADVAIR HFA 230-21 MCG INH: 230-21 | 30 days supply | Qty: 12 | Fill #1

## 2020-06-05 NOTE — Therapy (Signed)
[facility] [address] [place], [place], [address] Phone: [phone]   Fax:  [phone]  Speech Language Pathology Treatment  Patient Details  Name: Natalie Ford MRN: [id number] Date of Birth: May 26, 1971 Referring Provider (SLP): Tillman Sers, FNP   Encounter Date: 06/05/2020   End of Session - 06/05/20 1615    Visit Number 5    Number of Visits 13    Date for SLP Re-Evaluation 07/14/20    Authorization Type [employer]    SLP Start Time 1536   5 min late   SLP Stop Time  1613    SLP Time Calculation (min) 37 min    Activity Tolerance Patient tolerated treatment well           Past Medical History:  Diagnosis Date  . Allergic rhinitis   . COVID-19   . Dyspepsia   . Hypercholesteremia   . IBS (irritable bowel syndrome)   . Lumbosacral strain     Past Surgical History:  Procedure Laterality Date  . CARPAL TUNNEL RELEASE Right   . CESAREAN SECTION     x 2    There were no vitals filed for this visit.   Subjective Assessment - 06/05/20 1539    Subjective "I was a bit overwhelmed."    Currently in Pain? Yes                 ADULT SLP TREATMENT - 06/05/20 1611      General Information   Behavior/Cognition Alert;Cooperative;Pleasant mood      Treatment Provided   Treatment provided Cognitive-Linquistic      Cognitive-Linquistic Treatment   Treatment focused on Cognition    Skilled Treatment Pt brought planner today. Not feeling well physically, and reports, "I feel scattered." We discussed using strategies to assist with time management and organization. She has been trying to set times to work on tasks but not actually using a timer; she is going to start using a timer to see if this will help. Pt required min cues when using her planner to list appropriate items such as work and personal appointments. Generated plan/to do list for remaining hour of her workday and wrote this down.  Suggested spending 10 minutes at the end and beginning of each day generating a plan for the day or listing tasks to be accomplished next day.       Assessment / Recommendations / Plan   Plan Continue with current plan of care      Progression Toward Goals   Progression toward goals Progressing toward goals              SLP Short Term Goals - 06/05/20 1616      SLP SHORT TERM GOAL #1   Title pt will ID memory and organization system that is helpful for her and bring to 3 sessions    Baseline 05/24/20, 06/05/20    Time 1    Period Weeks    Status On-going      SLP SHORT TERM GOAL #2   Title pt will tell SLP 3 memory/attention compensations she can use to improve her recall/efficiency in documentation at work    Time 1    Period Weeks    Status On-going            SLP Long Term Goals - 06/05/20 1616      SLP LONG TERM GOAL #1   Title pt will use compensations for anomia in 15 minutes  mod complex-complex conversation x 3 visits.    Time 4    Period Weeks    Status On-going      SLP LONG TERM GOAL #2   Title Pt will report carryover of comps for attention and memory at work x 3 visits    Time 4    Period Weeks    Status On-going      SLP LONG TERM GOAL #3   Title use executive function system for organization/planning of work and home responsibilities    Time 4    Period Weeks    Status On-going            Plan - 06/05/20 1616    Clinical Impression Statement Natalie Ford is a pleasant, 49 y.o. female who presents with mild cognitive communication impairment and wordfinding difficulties s/p 2 Covid-19 infections. She reports increasing difficulties with cognition since her 2nd illness in January 2021. 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 She continues to work full-time, but has noted  memory and attention deficits which make it more difficult and time-consuming to complete her job. She is beginning to implement strategies for attention, recall, and organization; requires ongoing training for consistent carryover. I recommend skilled ST to train pt in compensations and strategies to improve her attention, recall and organization skills in order to improve function at work and home.    Speech Therapy Frequency 2x / week    Duration --   6 weeks or 13 total visits   Treatment/Interventions Language facilitation;Environmental controls;SLP instruction and feedback;Cognitive reorganization;Compensatory techniques;Functional tasks;Compensatory strategies;Internal/external aids;Multimodal communcation approach;Patient/family education    Potential to Achieve Goals Good    Consulted and Agree with Plan of Care Patient           Patient will benefit from skilled therapeutic intervention in order to improve the following deficits and impairments:   Cognitive communication deficit    Problem List Patient Active Problem List   Diagnosis Date Noted  . History of COVID-19 11/01/2019  . FUO (fever of unknown origin) 11/01/2019  . Cough 11/01/2019  . Shoulder impingement syndrome, left 06/22/2018  . Left leg pain 11/18/2017  . Carpal tunnel syndrome 10/01/2017  . Kidney stones 03/30/2014  . Physical exam, annual 06/29/2013  . Anxiety 06/29/2013  . Borderline hypercholesterolemia 05/14/2010  . DYSPEPSIA 05/14/2010  . LUMBOSACRAL STRAIN 05/14/2010  . Allergic rhinitis 05/15/2009  . UPPER RESPIRATORY INFECTION 05/09/2009  . GASTROENTERITIS 12/25/2007  . IRRITABLE BOWEL SYNDROME 12/25/2007   Natalie Baton, MS, CCC-SLP Speech-Language Pathologist  Arlana Lindau 06/05/2020, 4:17 PM  [place] [facility] [address] [place], [place], [address] Phone: [phone]   Fax:  [phone]   Name: Natalie Ford MRN:  [id number] Date of Birth: 10/12/1970

## 2020-06-05 NOTE — Patient Instructions (Signed)
Actually set a timer vs clock-watching when you set yourself time to do something like work on [employer].  Spend 10 minutes at the end of the everyday and the beginning of the next creating your plan of action for the day. Write down a loose timeline/agenda. You can modify this as the day goes on, but make sure to write things down. Check off as completed.

## 2020-06-07 ENCOUNTER — Ambulatory Visit: Payer: No Typology Code available for payment source | Admitting: Speech Pathology

## 2020-06-12 ENCOUNTER — Other Ambulatory Visit: Payer: Self-pay

## 2020-06-12 ENCOUNTER — Ambulatory Visit: Payer: No Typology Code available for payment source | Admitting: Speech Pathology

## 2020-06-12 DIAGNOSIS — R41841 Cognitive communication deficit: Secondary | ICD-10-CM | POA: Diagnosis not present

## 2020-06-12 DIAGNOSIS — R4701 Aphasia: Secondary | ICD-10-CM

## 2020-06-12 NOTE — Patient Instructions (Signed)
Figure out a "layout" that makes sense for your multiple screens; find a "home" for each one of the main tabs you're using.  Try using copy and paste to copy names/birthdates from chart to websites/forms  Keep a notepad with you; write down the dates (birthdays, refills, dosages)  Make a list of common dx codes  Schedule some wellness activities in your planner (yoga, breathing)   Memory Strategies  W - Write it down  A - Associate it with something  R - Repeat it  M - Mental Image   HOMEWORK -List your 3 most annoying/frustrating "glitches" at work AND then write a possible solution or strategy for each example that you could use to help avoid this -Use your hourly agenda to make your plan for each day

## 2020-06-12 NOTE — Therapy (Signed)
[facility] [address] [place], [place], [address] Phone: [phone]   Fax:  [phone]  Speech Language Pathology Treatment  Patient Details  Name: Natalie Ford MRN: [id number] Date of Birth: 1971/07/26 Referring Provider (SLP): Tillman Sers, FNP   Encounter Date: 06/12/2020   End of Session - 06/12/20 1758    Visit Number 6    Number of Visits 13    Date for SLP Re-Evaluation 07/14/20    Authorization Type [employer]    SLP Start Time 1702    SLP Stop Time  1742    SLP Time Calculation (min) 40 min    Activity Tolerance Patient tolerated treatment well           Past Medical History:  Diagnosis Date  . Allergic rhinitis   . COVID-19   . Dyspepsia   . Hypercholesteremia   . IBS (irritable bowel syndrome)   . Lumbosacral strain     Past Surgical History:  Procedure Laterality Date  . CARPAL TUNNEL RELEASE Right   . CESAREAN SECTION     x 2    There were no vitals filed for this visit.   Subjective Assessment - 06/12/20 1706    Subjective "I need to do better with diving my screens."                 ADULT SLP TREATMENT - 06/12/20 1755      General Information   Behavior/Cognition Alert;Cooperative;Pleasant mood      Treatment Provided   Treatment provided Cognitive-Linquistic      Pain Assessment   Pain Assessment No/denies pain      Cognitive-Linquistic Treatment   Treatment focused on Cognition    Skilled Treatment Pt has not written in planner since Friday; she had the idea to write down/schedule each hour of her day so SLP provided template for pt to try this week. We generated strategies to assist with recall of numbers for data entry, including memory strategies, use of copy/paste, and arranging windows on her desktop. See pt instructions for additional details.      Assessment / Recommendations / Plan   Plan Continue with current plan of care      Progression  Toward Goals   Progression toward goals Progressing toward goals              SLP Short Term Goals - 06/12/20 1800      SLP SHORT TERM GOAL #1   Title pt will ID memory and organization system that is helpful for her and bring to 3 sessions    Baseline 05/24/20, 06/05/20 06/12/20    Time 1    Period Weeks    Status Achieved      SLP SHORT TERM GOAL #2   Title pt will tell SLP 3 memory/attention compensations she can use to improve her recall/efficiency in documentation at work    Time 1    Period Weeks    Status Achieved            SLP Long Term Goals - 06/12/20 1800      SLP LONG TERM GOAL #1   Title pt will use compensations for anomia in 15 minutes mod complex-complex conversation x 3 visits.    Time 3    Period Weeks    Status On-going      SLP LONG TERM GOAL #2   Title Pt will report carryover of comps for attention and memory at work x  3 visits    Time 3    Period Weeks    Status On-going      SLP LONG TERM GOAL #3   Title use executive function system for organization/planning of work and home responsibilities    Time 3    Period Weeks    Status On-going            Plan - 06/12/20 1758    Clinical Impression Statement Natalie Ford is a pleasant, 49 y.o. female who presents with mild cognitive communication impairment and wordfinding difficulties s/p 2 Covid-19 infections. Natalie Ford is able to generate strategies to assist with attention, working memory and recall at work, however requires ongoing training for implementation/carryover. I recommend skilled ST to train pt in compensations and strategies to improve her attention, recall and organization skills in order to improve function at work and home.    Speech Therapy Frequency 2x / week    Duration --   6 weeks or 13 total visits   Treatment/Interventions Language facilitation;Environmental controls;SLP instruction and feedback;Cognitive reorganization;Compensatory techniques;Functional  tasks;Compensatory strategies;Internal/external aids;Multimodal communcation approach;Patient/family education    Potential to Achieve Goals Good    Consulted and Agree with Plan of Care Patient           Patient will benefit from skilled therapeutic intervention in order to improve the following deficits and impairments:   Cognitive communication deficit  Aphasia    Problem List Patient Active Problem List   Diagnosis Date Noted  . History of COVID-19 11/01/2019  . FUO (fever of unknown origin) 11/01/2019  . Cough 11/01/2019  . Shoulder impingement syndrome, left 06/22/2018  . Left leg pain 11/18/2017  . Carpal tunnel syndrome 10/01/2017  . Kidney stones 03/30/2014  . Physical exam, annual 06/29/2013  . Anxiety 06/29/2013  . Borderline hypercholesterolemia 05/14/2010  . DYSPEPSIA 05/14/2010  . LUMBOSACRAL STRAIN 05/14/2010  . Allergic rhinitis 05/15/2009  . UPPER RESPIRATORY INFECTION 05/09/2009  . GASTROENTERITIS 12/25/2007  . IRRITABLE BOWEL SYNDROME 12/25/2007   Natalie Baton, MS, CCC-SLP Speech-Language Pathologist  Natalie Ford 06/12/2020, 6:01 PM  [facility] [address] [place], [place], [address] Phone: [phone]   Fax:  [phone]   Name: Natalie Ford MRN: [id number] Date of Birth: [birth year]

## 2020-06-14 ENCOUNTER — Ambulatory Visit: Payer: No Typology Code available for payment source | Admitting: Speech Pathology

## 2020-06-19 ENCOUNTER — Ambulatory Visit: Payer: No Typology Code available for payment source | Admitting: Speech Pathology

## 2020-06-19 ENCOUNTER — Other Ambulatory Visit: Payer: Self-pay

## 2020-06-19 DIAGNOSIS — R41841 Cognitive communication deficit: Secondary | ICD-10-CM | POA: Diagnosis not present

## 2020-06-19 NOTE — Patient Instructions (Signed)
See if you can work on consolidating your home to-do lists and goals. Some suggestions:  -Try to keep everything in one place so you don't have to go "hunting" when you need to check your list or write something down. -Maybe try color coding on a single list -Add "home items" that you need to complete during the workday to your agenda -Use Beckey Rutter to set a reminder to make a phone call to the dentist

## 2020-06-19 NOTE — Therapy (Signed)
[facility] [address] [place], [place], [address] Phone: [phone]   Fax:  [phone]  Speech Language Pathology Treatment  Patient Details  Name: Natalie Ford MRN: [id number] Date of Birth: 06-25-1971 Referring Provider (SLP): Tillman Sers, FNP   Encounter Date: 06/19/2020   End of Session - 06/19/20 1759    Visit Number 7    Number of Visits 13    Date for SLP Re-Evaluation 07/14/20    Authorization [place] [employer]    SLP Start Time 1704    SLP Stop Time  1745    SLP Time Calculation (min) 41 min    Activity Tolerance Patient tolerated treatment well           Past Medical History:  Diagnosis Date  . Allergic rhinitis   . COVID-19   . Dyspepsia   . Hypercholesteremia   . IBS (irritable bowel syndrome)   . Lumbosacral strain     Past Surgical History:  Procedure Laterality Date  . CARPAL TUNNEL RELEASE Right   . CESAREAN SECTION     x 2    There were no vitals filed for this visit.   Subjective Assessment - 06/19/20 1706    Subjective "It's like I start out strong, and then 2 or 3 comes..."    Currently in Pain? Yes    Pain Score 6     Pain Location --   generalized                ADULT SLP TREATMENT - 06/19/20 1710      General Information   Behavior/Cognition Alert;Cooperative;Pleasant mood      Treatment Provided   Treatment provided Cognitive-Linquistic      Pain Assessment   Pain Assessment No/denies pain      Cognitive-Linquistic Treatment   Treatment focused on Cognition    Skilled Treatment Patient reports using calendar to record her punches in/out from work. She feels writing a plan/outline for the day the night before has been helpful. She is carrying over compensations at work but not consistently; she feels she could do this better. SLP provided feedback on which strategies appear to be working well for pt and to consider how she might implement similar  techniques with other challenges. Today SLP collaborated with pt to ID strategies for attention/recall and organization for home tasks; pt feels she is keeping too many lists. She will work to consolidate these this week. Setting a regular time to finish her work has been helpful in allowing her to complete more of her home duties such as cooking dinner. See pt instructions for additional details.      Assessment / Recommendations / Plan   Plan Continue with current plan of care      Progression Toward Goals   Progression toward goals Progressing toward goals              SLP Short Term Goals - 06/19/20 1801      SLP SHORT TERM GOAL #1   Title pt will ID memory and organization system that is helpful for her and bring to 3 sessions    Baseline 05/24/20, 06/05/20 06/12/20    Time 1    Period Weeks    Status Achieved      SLP SHORT TERM GOAL #2   Title pt will tell SLP 3 memory/attention compensations she can use to improve her recall/efficiency in documentation at work    Time 1  Period Weeks    Status Achieved            SLP Long Term Goals - 06/19/20 1801      SLP LONG TERM GOAL #1   Title pt will use compensations for anomia in 15 minutes mod complex-complex conversation x 3 visits.    Time 2    Period Weeks    Status On-going      SLP LONG TERM GOAL #2   Title Pt will report carryover of comps for attention and memory at work x 3 visits    Time 2    Period Weeks    Status On-going      SLP LONG TERM GOAL #3   Title use executive function system for organization/planning of work and home responsibilities    Time 2    Period Weeks    Status On-going            Plan - 06/19/20 1801    Clinical Impression Statement Natalie Ford is a pleasant, 49 y.o. female who presents with mild cognitive communication impairment and wordfinding difficulties s/p 2 Covid-19 infections. Natalie Ford is able to generate strategies to assist with attention, working memory and recall  at work, however requires ongoing training for implementation/carryover. I recommend skilled ST to train pt in compensations and strategies to improve her attention, recall and organization skills in order to improve function at work and home.    Speech Therapy Frequency 2x / week    Duration --   6 weeks or 13 total visits   Treatment/Interventions Language facilitation;Environmental controls;SLP instruction and feedback;Cognitive reorganization;Compensatory techniques;Functional tasks;Compensatory strategies;Internal/external aids;Multimodal communcation approach;Patient/family education    Potential to Achieve Goals Good    Consulted and Agree with Plan of Care Patient           Patient will benefit from skilled therapeutic intervention in order to improve the following deficits and impairments:   Cognitive communication deficit    Problem List Patient Active Problem List   Diagnosis Date Noted  . History of COVID-19 11/01/2019  . FUO (fever of unknown origin) 11/01/2019  . Cough 11/01/2019  . Shoulder impingement syndrome, left 06/22/2018  . Left leg pain 11/18/2017  . Carpal tunnel syndrome 10/01/2017  . Kidney stones 03/30/2014  . Physical exam, annual 06/29/2013  . Anxiety 06/29/2013  . Borderline hypercholesterolemia 05/14/2010  . DYSPEPSIA 05/14/2010  . LUMBOSACRAL STRAIN 05/14/2010  . Allergic rhinitis 05/15/2009  . UPPER RESPIRATORY INFECTION 05/09/2009  . GASTROENTERITIS 12/25/2007  . IRRITABLE BOWEL SYNDROME 12/25/2007   Rondel Baton, MS, CCC-SLP Speech-Language Pathologist  Arlana Lindau 06/19/2020, 6:02 PM  [place] [facility] [address] [place], [place], [address] Phone: [phone]   Fax:  [phone]   Name: Natalie Ford MRN: [id number] Date of Birth: [birth year]

## 2020-06-21 ENCOUNTER — Other Ambulatory Visit: Payer: Self-pay

## 2020-06-21 ENCOUNTER — Ambulatory Visit: Payer: No Typology Code available for payment source | Admitting: Speech Pathology

## 2020-06-21 DIAGNOSIS — R41841 Cognitive communication deficit: Secondary | ICD-10-CM

## 2020-06-21 DIAGNOSIS — R4701 Aphasia: Secondary | ICD-10-CM

## 2020-06-21 NOTE — Patient Instructions (Addendum)
Use your Dragon!  For wordfinding practice: Heads up, Stop! Categories, Taboo  Lots of great apps for Language, Memory, Attention, Planning/problem solving https://www.barrowneuro.org/centers-programs/neurorehabilitation/resources/neuro-rehabilitation-apps-and-games/

## 2020-06-22 NOTE — Therapy (Signed)
[facility] [address] [place], [place], [address] Phone: [phone]   Fax:  [phone]  Speech Language Pathology Treatment  Patient Details  Name: Natalie Ford MRN: [id number] Date of Birth: 19-Oct-1970 Referring Provider (SLP): Tillman Sers, FNP   Encounter Date: 06/21/2020   End of Session - 06/22/20 1304    Visit Number 8    Number of Visits 13    Date for SLP Re-Evaluation 07/14/20    SLP Start Time 1706    SLP Stop Time  1745    SLP Time Calculation (min) 39 min           Past Medical History:  Diagnosis Date  . Allergic rhinitis   . COVID-19   . Dyspepsia   . Hypercholesteremia   . IBS (irritable bowel syndrome)   . Lumbosacral strain     Past Surgical History:  Procedure Laterality Date  . CARPAL TUNNEL RELEASE Right   . CESAREAN SECTION     x 2    There were no vitals filed for this visit.   Subjective Assessment - 06/21/20 1706    Subjective "You wake up and you never know."    Currently in Pain? Yes    Pain Score 6     Pain Location --   generalized                ADULT SLP TREATMENT - 06/22/20 1256      General Information   Behavior/Cognition Alert;Cooperative;Pleasant mood      Treatment Provided   Treatment provided Cognitive-Linquistic      Cognitive-Linquistic Treatment   Treatment focused on Aphasia;Cognition    Skilled Treatment Patient reports consolidating her to-do list and adding to do items to her daily agenda. It was helpful to see everything in one place. We focused on wordfinding strategies and compensations to help with spelling errors when typing notes, such as using her phone to look up a word or using her dictation system. She notices her words are "coming quicker," and it helps her to make verbal associations as trained in previous session. Provided handout for semantic feature analysis and demonstrated how pt could use this to "workshop" a  communication breakdown or as a reminder for self-cuing. Pt used compensations independently in mod complex-complex conversation today (slower rate) and expressive skills were [facility]. Educated on family games, activities, and apps for home.       Assessment / Recommendations / Plan   Plan Continue with current plan of care      Progression Toward Goals   Progression toward goals Progressing toward goals            SLP Education - 06/22/20 1303    Education Details comps for anomia            SLP Short Term Goals - 06/19/20 1801      SLP SHORT TERM GOAL #1   Title pt will ID memory and organization system that is helpful for her and bring to 3 sessions    Baseline 05/24/20, 06/05/20 06/12/20    Time 1    Period Weeks    Status Achieved      SLP SHORT TERM GOAL #2   Title pt will tell SLP 3 memory/attention compensations she can use to improve her recall/efficiency in documentation at work    Time 1    Period Weeks    Status Achieved  SLP Long Term Goals - 06/22/20 1301      SLP LONG TERM GOAL #1   Title pt will use compensations for anomia in 15 minutes mod complex-complex conversation x 3 visits.    Baseline 06/21/20    Time 2    Period Weeks    Status On-going      SLP LONG TERM GOAL #2   Title Pt will report carryover of comps for attention and memory at work x 3 visits    Baseline 06/21/20    Time 2    Period Weeks    Status On-going      SLP LONG TERM GOAL #3   Title use executive function system for organization/planning of work and home responsibilities    Time 2    Period Weeks    Status On-going            Plan - 06/22/20 1304    Clinical Impression Statement Natalie Ford is a pleasant, 49 y.o. female who presents with mild cognitive communication impairment and wordfinding difficulties s/p 2 Covid-19 infections. Natalie Ford is able to generate strategies to assist with attention, working memory and recall at work, however requires ongoing  training for implementation/carryover. I recommend skilled ST to train pt in compensations and strategies to improve her attention, recall and organization skills in order to improve function at work and home.    Speech Therapy Frequency 2x / week    Duration --   6 weeks or 13 total visits   Treatment/Interventions Language facilitation;Environmental controls;SLP instruction and feedback;Cognitive reorganization;Compensatory techniques;Functional tasks;Compensatory strategies;Internal/external aids;Multimodal communcation approach;Patient/family education    Potential to Achieve Goals Good    Consulted and Agree with Plan of Care Patient           Patient will benefit from skilled therapeutic intervention in order to improve the following deficits and impairments:   Aphasia  Cognitive communication deficit    Problem List Patient Active Problem List   Diagnosis Date Noted  . History of COVID-19 11/01/2019  . FUO (fever of unknown origin) 11/01/2019  . Cough 11/01/2019  . Shoulder impingement syndrome, left 06/22/2018  . Left leg pain 11/18/2017  . Carpal tunnel syndrome 10/01/2017  . Kidney stones 03/30/2014  . Physical exam, annual 06/29/2013  . Anxiety 06/29/2013  . Borderline hypercholesterolemia 05/14/2010  . DYSPEPSIA 05/14/2010  . LUMBOSACRAL STRAIN 05/14/2010  . Allergic rhinitis 05/15/2009  . UPPER RESPIRATORY INFECTION 05/09/2009  . GASTROENTERITIS 12/25/2007  . IRRITABLE BOWEL SYNDROME 12/25/2007   Rondel Baton, MS, CCC-SLP Speech-Language Pathologist  Natalie Ford 06/22/2020, 1:05 PM  [facility] [address] [place], [place], [address] Phone: [phone]   Fax:  [phone]   Name: Natalie Ford MRN: [id number] Date of Birth: 1971/08/20

## 2020-06-26 ENCOUNTER — Other Ambulatory Visit: Payer: Self-pay

## 2020-06-26 ENCOUNTER — Ambulatory Visit: Payer: No Typology Code available for payment source | Attending: Family Medicine | Admitting: Speech Pathology

## 2020-06-26 DIAGNOSIS — R41841 Cognitive communication deficit: Secondary | ICD-10-CM | POA: Diagnosis not present

## 2020-06-26 DIAGNOSIS — R4701 Aphasia: Secondary | ICD-10-CM | POA: Insufficient documentation

## 2020-06-26 NOTE — Therapy (Signed)
[facility] [address] [place], [place], [address] Phone: [phone]   Fax:  [phone]  Speech Language Pathology Treatment and Discharge Summary  Patient Details  Name: Natalie Ford MRN: [id number] Date of Birth: Apr 23, 1971 Referring Provider (SLP): Everette Rank, FNP   Encounter Date: 06/26/2020   End of Session - 06/26/20 1750    Visit Number 9    Number of Visits 13    Date for SLP Re-Evaluation 07/14/20    Authorization Type [employer]- [place]    SLP Start Time 1707    SLP Stop Time  1750    SLP Time Calculation (min) 43 min    Activity Tolerance Patient tolerated treatment well           Past Medical History:  Diagnosis Date  . Allergic rhinitis   . COVID-19   . Dyspepsia   . Hypercholesteremia   . IBS (irritable bowel syndrome)   . Lumbosacral strain     Past Surgical History:  Procedure Laterality Date  . CARPAL TUNNEL RELEASE Right   . CESAREAN SECTION     x 2    There were no vitals filed for this visit.   Subjective Assessment - 06/26/20 1759    Subjective "I think I'm staying on task."    Currently in Pain? Yes    Pain Score 6     Pain Location --   generalized                ADULT SLP TREATMENT - 06/26/20 1800      General Information   Behavior/Cognition Alert;Cooperative;Pleasant mood      Treatment Provided   Treatment provided Cognitive-Linquistic      Cognitive-Linquistic Treatment   Treatment focused on Cognition;Aphasia    Skilled Treatment Natalie Ford continues to carry over use of her agenda and to-do list to manage work schedule and home responsibilities. She has also started using dictation some at work to help her with documentation. Her son had trouble thinking of a word and she cued him to describe it: "It works!" She downloaded some of the apps suggested last session and is looking forward to working with them. Readministered portion of Multifactorial Memory  Questionnaire: "How I Feel About My Memory." Patient t score=60 compared with 42 at time of evaluation (improvement from "average" to "above average.") She has met LTGs and is in agreement with d/c.      Assessment / Recommendations / Plan   Plan Discharge SLP treatment due to (comment);All goals met      Progression Toward Goals   Progression toward goals Goals met, education completed, patient discharged from [facility] - 06/19/20 [place] #1   Title pt will ID memory and organization system that is helpful for her and bring to 3 sessions    Baseline 05/24/20, 06/05/20 06/12/20    Time 1    Period Weeks    Status Achieved      SLP SHORT TERM GOAL #2   Title pt will tell SLP 3 memory/attention compensations she can use to improve her recall/efficiency in documentation at work    Time 1    Period Weeks    Status Achieved            SLP Long Term Goals - 06/26/20 [place]  GOAL #1   Title pt will use compensations for anomia in 15 minutes mod complex-complex conversation x 3 visits.    Baseline 06/21/20    Time 2    Period Weeks    Status Achieved      SLP LONG TERM GOAL #2   Title Pt will report carryover of comps for attention and memory at work x 3 visits    Baseline 06/21/20, 06/26/20    Time 1    Period Weeks    Status Achieved      SLP LONG TERM GOAL #3   Title use executive function system for organization/planning of work and home responsibilities    Time 1    Period Weeks    Status Achieved            Plan - 06/26/20 1756    Clinical Impression Statement Natalie Ford is independently carrying over compensations for attention, memory, organization and wordfinding at work and at home.  She reports she is more confident in her ability to focus and remember things. She is pleased with current functional level and is in agreement with d/c today.    Speech Therapy Frequency --   d/c   Duration --    d/c   Treatment/Interventions Language facilitation;Environmental controls;SLP instruction and feedback;Cognitive reorganization;Compensatory techniques;Functional tasks;Compensatory strategies;Internal/external aids;Multimodal communcation approach;Patient/family education    Potential to Achieve Goals Good    Consulted and Agree with Plan of Care Patient           Patient will benefit from skilled therapeutic intervention in order to improve the following deficits and impairments:   Cognitive communication deficit  Aphasia    Problem List Patient Active Problem List   Diagnosis Date Noted  . History of COVID-19 11/01/2019  . FUO (fever of unknown origin) 11/01/2019  . Cough 11/01/2019  . Shoulder impingement syndrome, left 06/22/2018  . Left leg pain 11/18/2017  . Carpal tunnel syndrome 10/01/2017  . Kidney stones 03/30/2014  . Physical exam, annual 06/29/2013  . Anxiety 06/29/2013  . Borderline hypercholesterolemia 05/14/2010  . DYSPEPSIA 05/14/2010  . LUMBOSACRAL STRAIN 05/14/2010  . Allergic rhinitis 05/15/2009  . UPPER RESPIRATORY INFECTION 05/09/2009  . GASTROENTERITIS 12/25/2007  . IRRITABLE BOWEL SYNDROME 12/25/2007    SPEECH THERAPY DISCHARGE SUMMARY  Visits from Start of Care: 9  Current functional level related to goals / functional outcomes: All goals met. Patient is pleased with current functional level. She is carrying over compensations for attention, recall, organization and wordfinding. 20 minutes mod complex-complex conversation was WNL today.    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 Patient is compensating for this using external aids and strategies.   Education / Equipment: Compensations and strategies to improve cognitive function Plan: Patient agrees to discharge.  Patient goals were met. Patient is being discharged due to meeting the stated rehab goals.  ?????         Deneise Lever, [place], CCC-SLP Speech-Language Pathologist  [name] 06/26/2020, [facility] [address] [place], [place], [address] Phone: [phone]   Fax:  [phone]   Name: Natalie Ford MRN: [id number] Date of Birth: 12/23/1970

## 2020-06-26 NOTE — Patient Instructions (Signed)
"  Talking to yourself" is a good strategy! This might help you when you're typing: verbalize what you're writing.

## 2020-06-28 ENCOUNTER — Ambulatory Visit: Payer: No Typology Code available for payment source | Admitting: Speech Pathology

## 2020-06-28 MED FILL — HUMIRA 40 MG/0.8ML PSKT: 40 | 28 days supply | Qty: 2 | Fill #1

## 2020-07-26 MED FILL — HUMIRA 40 MG/0.8ML PSKT: 40 | 28 days supply | Qty: 2 | Fill #2

## 2020-08-05 ENCOUNTER — Other Ambulatory Visit: Payer: Self-pay

## 2020-08-05 ENCOUNTER — Emergency Department (HOSPITAL_BASED_OUTPATIENT_CLINIC_OR_DEPARTMENT_OTHER): Payer: No Typology Code available for payment source

## 2020-08-05 ENCOUNTER — Inpatient Hospital Stay (HOSPITAL_BASED_OUTPATIENT_CLINIC_OR_DEPARTMENT_OTHER)
Admission: EM | Admit: 2020-08-05 | Discharge: 2020-08-07 | DRG: 103 | Disposition: A | Discharge status: Home / Self Care | Payer: No Typology Code available for payment source | Attending: Neurology | Admitting: Neurology

## 2020-08-05 ENCOUNTER — Encounter (HOSPITAL_BASED_OUTPATIENT_CLINIC_OR_DEPARTMENT_OTHER): Payer: Self-pay

## 2020-08-05 DIAGNOSIS — R251 Tremor, unspecified: Secondary | ICD-10-CM | POA: Diagnosis present

## 2020-08-05 DIAGNOSIS — Z8616 Personal history of COVID-19: Secondary | ICD-10-CM | POA: Diagnosis not present

## 2020-08-05 DIAGNOSIS — R531 Weakness: Secondary | ICD-10-CM | POA: Diagnosis present

## 2020-08-05 DIAGNOSIS — Z7722 Contact with and (suspected) exposure to environmental tobacco smoke (acute) (chronic): Secondary | ICD-10-CM | POA: Diagnosis present

## 2020-08-05 DIAGNOSIS — I639 Cerebral infarction, unspecified: Secondary | ICD-10-CM | POA: Diagnosis present

## 2020-08-05 DIAGNOSIS — H547 Unspecified visual loss: Secondary | ICD-10-CM | POA: Diagnosis present

## 2020-08-05 DIAGNOSIS — I6389 Other cerebral infarction: Secondary | ICD-10-CM | POA: Diagnosis not present

## 2020-08-05 DIAGNOSIS — G43109 Migraine with aura, not intractable, without status migrainosus: Principal | ICD-10-CM | POA: Diagnosis present

## 2020-08-05 DIAGNOSIS — E7800 Pure hypercholesterolemia, unspecified: Secondary | ICD-10-CM | POA: Diagnosis present

## 2020-08-05 DIAGNOSIS — Z20822 Contact with and (suspected) exposure to covid-19: Secondary | ICD-10-CM | POA: Diagnosis present

## 2020-08-05 DIAGNOSIS — K589 Irritable bowel syndrome without diarrhea: Secondary | ICD-10-CM | POA: Diagnosis present

## 2020-08-05 DIAGNOSIS — H538 Other visual disturbances: Secondary | ICD-10-CM | POA: Diagnosis present

## 2020-08-05 DIAGNOSIS — J309 Allergic rhinitis, unspecified: Secondary | ICD-10-CM | POA: Diagnosis present

## 2020-08-05 DIAGNOSIS — Z9282 Status post administration of tPA (rtPA) in a different facility within the last 24 hours prior to admission to current facility: Secondary | ICD-10-CM | POA: Diagnosis not present

## 2020-08-05 DIAGNOSIS — M069 Rheumatoid arthritis, unspecified: Secondary | ICD-10-CM | POA: Diagnosis present

## 2020-08-05 DIAGNOSIS — Z880 Allergy status to penicillin: Secondary | ICD-10-CM

## 2020-08-05 DIAGNOSIS — Z7952 Long term (current) use of systemic steroids: Secondary | ICD-10-CM

## 2020-08-05 DIAGNOSIS — G459 Transient cerebral ischemic attack, unspecified: Secondary | ICD-10-CM | POA: Diagnosis not present

## 2020-08-05 DIAGNOSIS — Z7951 Long term (current) use of inhaled steroids: Secondary | ICD-10-CM | POA: Diagnosis not present

## 2020-08-05 DIAGNOSIS — Z79899 Other long term (current) drug therapy: Secondary | ICD-10-CM

## 2020-08-05 HISTORY — DX: Cerebral infarction, unspecified: I63.9

## 2020-08-05 LAB — COMPREHENSIVE METABOLIC PANEL
ALT: 19 U/L (ref 0–44)
ALT: 19 U/L (ref 0–44)
AST: 19 U/L (ref 15–41)
AST: 21 U/L (ref 15–41)
Albumin: 4.6 g/dL (ref 3.5–5.0)
Albumin: 4.6 g/dL (ref 3.5–5.0)
Alkaline Phosphatase: 34 U/L — ABNORMAL LOW (ref 38–126)
Alkaline Phosphatase: 33 U/L — ABNORMAL LOW (ref 38–126)
Anion gap: 10 (ref 5–15)
Anion gap: 10 (ref 5–15)
BUN: 10 mg/dL (ref 6–20)
BUN: 10 mg/dL (ref 6–20)
CO2: 23 mmol/L (ref 22–32)
CO2: 24 mmol/L (ref 22–32)
Calcium: 8.8 mg/dL — ABNORMAL LOW (ref 8.9–10.3)
Calcium: 9 mg/dL (ref 8.9–10.3)
Chloride: 102 mmol/L (ref 98–111)
Chloride: 102 mmol/L (ref 98–111)
Creatinine, Ser: 0.8 mg/dL (ref 0.44–1.00)
Creatinine, Ser: 0.87 mg/dL (ref 0.44–1.00)
GFR, Estimated: >60 mL/min (ref >60)
GFR, Estimated: >60 mL/min (ref >60)
Glucose, Bld: 97 mg/dL (ref 70–99)
Glucose, Bld: 90 mg/dL (ref 70–99)
Potassium: 3.9 mmol/L (ref 3.5–5.1)
Potassium: 3.7 mmol/L (ref 3.5–5.1)
Sodium: 135 mmol/L (ref 135–145)
Sodium: 136 mmol/L (ref 135–145)
Total Bilirubin: 0.6 mg/dL (ref 0.3–1.2)
Total Bilirubin: 0.7 mg/dL (ref 0.3–1.2)
Total Protein: 6.7 g/dL (ref 6.5–8.1)
Total Protein: 6.9 g/dL (ref 6.5–8.1)

## 2020-08-05 LAB — CBC
HCT: 40 % (ref 36.0–46.0)
Hemoglobin: 13.7 g/dL (ref 12.0–15.0)
MCH: 33.7 pg (ref 26.0–34.0)
MCHC: 34.3 g/dL (ref 30.0–36.0)
MCV: 98.3 fL (ref 80.0–100.0)
Platelets: 291 10*3/uL (ref 150–400)
RBC: 4.07 MIL/uL (ref 3.87–5.11)
RDW: 11.9 % (ref 11.5–15.5)
WBC: 9.9 10*3/uL (ref 4.0–10.5)
nRBC: 0 % (ref 0.0–0.2)

## 2020-08-05 LAB — ETHANOL: Alcohol, Ethyl (B): <10 mg/dL (ref <10)

## 2020-08-05 LAB — RAPID URINE DRUG SCREEN, HOSP PERFORMED
Amphetamines: NOT DETECTED
Barbiturates: NOT DETECTED
Benzodiazepines: NOT DETECTED
Cocaine: NOT DETECTED
Opiates: NOT DETECTED
Tetrahydrocannabinol: NOT DETECTED

## 2020-08-05 LAB — URINALYSIS, ROUTINE W REFLEX MICROSCOPIC
Bilirubin Urine: NEGATIVE
Glucose, UA: NEGATIVE mg/dL
Hgb urine dipstick: NEGATIVE
Ketones, ur: NEGATIVE mg/dL
Leukocytes,Ua: NEGATIVE
Nitrite: NEGATIVE
Protein, ur: NEGATIVE mg/dL
Specific Gravity, Urine: <1.005 — ABNORMAL LOW (ref 1.005–1.030)
pH: 6.5 (ref 5.0–8.0)

## 2020-08-05 LAB — DIFFERENTIAL
Abs Immature Granulocytes: 0.05 K/uL (ref 0.00–0.07)
Basophils Absolute: 0.1 K/uL (ref 0.0–0.1)
Basophils Relative: 1 %
Eosinophils Absolute: 0.1 K/uL (ref 0.0–0.5)
Eosinophils Relative: 1 %
Immature Granulocytes: 1 %
Lymphocytes Relative: 23 %
Lymphs Abs: 2.3 K/uL (ref 0.7–4.0)
Monocytes Absolute: 0.9 K/uL (ref 0.1–1.0)
Monocytes Relative: 9 %
Neutro Abs: 6.5 K/uL (ref 1.7–7.7)
Neutrophils Relative %: 65 %

## 2020-08-05 LAB — PROTIME-INR
INR: 0.9 (ref 0.8–1.2)
Prothrombin Time: 12 seconds (ref 11.4–15.2)

## 2020-08-05 LAB — APTT: aPTT: 24 s (ref 24–36)

## 2020-08-05 LAB — RESP PANEL BY RT-PCR (FLU A&B, COVID) ARPGX2
Influenza A by PCR: NEGATIVE
Influenza B by PCR: NEGATIVE
SARS Coronavirus 2 by RT PCR: NEGATIVE

## 2020-08-05 LAB — PREGNANCY, URINE: Preg Test, Ur: NEGATIVE

## 2020-08-05 LAB — CBG MONITORING, ED: Glucose-Capillary: 81 mg/dL (ref 70–99)

## 2020-08-05 LAB — TROPONIN I (HIGH SENSITIVITY)
Troponin I (High Sensitivity): 4 ng/L (ref <18)
Troponin I (High Sensitivity): 6 ng/L (ref <18)

## 2020-08-05 MED ORDER — SENNOSIDES-DOCUSATE SODIUM 8.6-50 MG PO TABS
1.0000 | ORAL_TABLET | Freq: Every evening | ORAL | Status: DC | PRN
  Filled 2020-08-05: qty 1

## 2020-08-05 MED ORDER — SODIUM CHLORIDE 0.9 % IV SOLN
50.0000 mL | Freq: Once | INTRAVENOUS | Status: AC
  Administered 2020-08-05: 50 mL via INTRAVENOUS

## 2020-08-05 MED ORDER — ACETAMINOPHEN 160 MG/5ML PO SOLN: 650.0000 mg | ORAL | Status: DC | PRN

## 2020-08-05 MED ORDER — ACETAMINOPHEN 650 MG RE SUPP: 650.0000 mg | RECTAL | Status: DC | PRN

## 2020-08-05 MED ORDER — PANTOPRAZOLE SODIUM 40 MG IV SOLR
40.0000 mg | Freq: Every day | INTRAVENOUS | Status: DC
  Administered 2020-08-05 – 2020-08-06 (×2): 40 mg via INTRAVENOUS
  Filled 2020-08-05 (×2): qty 40

## 2020-08-05 MED ORDER — ACETAMINOPHEN 325 MG PO TABS
650.0000 mg | ORAL_TABLET | ORAL | Status: DC | PRN
  Administered 2020-08-06: 650 mg via ORAL
  Filled 2020-08-05: qty 2

## 2020-08-05 MED ORDER — IOHEXOL 350 MG/ML SOLN
100.0000 mL | Freq: Once | INTRAVENOUS | Status: AC | PRN
  Administered 2020-08-05: 100 mL via INTRAVENOUS

## 2020-08-05 MED ORDER — ALTEPLASE (STROKE) FULL DOSE INFUSION
0.9000 mg/kg | Freq: Once | INTRAVENOUS | Status: AC
  Administered 2020-08-05: 46.5 mg via INTRAVENOUS
  Filled 2020-08-05: qty 100

## 2020-08-05 MED ORDER — ALTEPLASE 100 MG IV SOLR
INTRAVENOUS | Status: AC
  Filled 2020-08-05: qty 100

## 2020-08-05 MED ORDER — STROKE: EARLY STAGES OF RECOVERY BOOK
Freq: Once | Status: DC
  Filled 2020-08-05 (×2): qty 1

## 2020-08-05 NOTE — ED Provider Notes (Addendum)
[facility] EMERGENCY DEPARTMENT Provider Note   CSN: [id number] Arrival date & time: 08/05/20  1827     History Chief Complaint  Patient presents with  . Tremors  . Dizziness    Natalie Ford is a 49 y.o. female.  Patient was wrapping Christmas gifts when suddenly at 5 PM patient developed significant vertigo dizziness room spinning. Also at the same time had right arm numbness and seem to have weakness to the right leg. She has had some visual problems for the past few weeks which they thought was may be related to her rheumatoid arthritis. But it does appear to be worse here tonight. Patient felt completely fine earlier today. Based on patient's symptoms and onset will call code stroke.        Past Medical History:  Diagnosis Date  . Allergic rhinitis   . COVID-19   . Dyspepsia   . Hypercholesteremia   . IBS (irritable bowel syndrome)   . Lumbosacral strain     Patient Active Problem List   Diagnosis Date Noted  . History of COVID-19 11/01/2019  . FUO (fever of unknown origin) 11/01/2019  . Cough 11/01/2019  . Shoulder impingement syndrome, left 06/22/2018  . Left leg pain 11/18/2017  . Carpal tunnel syndrome 10/01/2017  . Kidney stones 03/30/2014  . Physical exam, annual 06/29/2013  . Anxiety 06/29/2013  . Borderline hypercholesterolemia 05/14/2010  . DYSPEPSIA 05/14/2010  . LUMBOSACRAL STRAIN 05/14/2010  . Allergic rhinitis 05/15/2009  . UPPER RESPIRATORY INFECTION 05/09/2009  . GASTROENTERITIS 12/25/2007  . IRRITABLE BOWEL SYNDROME 12/25/2007    Past Surgical History:  Procedure Laterality Date  . CARPAL TUNNEL RELEASE Right   . CESAREAN SECTION     x 2     OB History   No obstetric history on file.     Family History  Problem Relation Age of Onset  . Diabetes Mother   . Heart disease Mother   . Leukemia Father   . Stomach cancer Paternal Grandfather   . Kidney disease Paternal Aunt     Social History   Tobacco Use  .  Smoking status: Never Smoker  . Smokeless tobacco: Never Used  . Tobacco comment: exposed to second hand smoke  Vaping Use  . Vaping Use: Never used  Substance Use Topics  . Alcohol use: Yes    Comment: social use  . Drug use: No    Home Medications Prior to Admission medications   Medication Sig Start Date End Date Taking? Authorizing Provider  Adalimumab (HUMIRA) 40 MG/0.8ML PSKT Inject 0.8 mls under the skin once every two weeks. 05/30/20   Quentin Angst, MD  albuterol (VENTOLIN HFA) 108 (90 Base) MCG/ACT inhaler Inhale 1-2 puffs into the lungs every 6 (six) hours as needed for wheezing or shortness of breath. 12/20/19   Charlott Holler, MD  buPROPion (WELLBUTRIN XL) 150 MG 24 hr tablet bupropion HCl XL 150 mg 24 hr tablet, extended release  TAKE 3 TABLETS BY MOUTH IN THE MORNING    [provider]  cetirizine (ZYRTEC) 10 MG tablet Take 10 mg by mouth daily.    [provider]  fluticasone (FLONASE) 50 MCG/ACT nasal spray Place 2 sprays into both nostrils daily.    [provider]  fluticasone-salmeterol (ADVAIR HFA) 230-21 MCG/ACT inhaler Inhale 2 puffs into the lungs 2 (two) times daily. 01/26/20   Charlott Holler, MD  gabapentin (NEURONTIN) 300 MG capsule Take 300 mg by mouth 3 (three) times daily as  needed. 02/24/20   [provider]  ibuprofen (ADVIL,MOTRIN) 200 MG tablet Take 200 mg by mouth every 6 (six) hours as needed.    [provider]  modafinil (PROVIGIL) 200 MG tablet Take 200 mg by mouth daily. 02/23/20   [provider]  Multiple Vitamin (MULTIVITAMIN) capsule Take 1 capsule by mouth daily.     [provider]  prednisoLONE 5 MG TABS tablet Take one and a half tablet by mouth (7.5 mg total) once daily    [provider]  predniSONE (DELTASONE) 5 MG tablet Take by mouth. Patient not taking: Reported on 05/15/2020 02/20/20   [provider]  spironolactone (ALDACTONE) 50 MG tablet Take 50 mg by  mouth daily. 03/06/20   [provider]    Allergies    Penicillins  Review of Systems   Review of Systems  Constitutional: Negative for chills and fever.  HENT: Negative for rhinorrhea and sore throat.   Eyes: Positive for visual disturbance.  Respiratory: Negative for cough and shortness of breath.   Cardiovascular: Negative for chest pain and leg swelling.  Gastrointestinal: Negative for abdominal pain, diarrhea, nausea and vomiting.  Genitourinary: Negative for dysuria.  Musculoskeletal: Negative for back pain and neck pain.  Skin: Negative for rash.  Neurological: Positive for dizziness, weakness and numbness. Negative for light-headedness and headaches.  Hematological: Does not bruise/bleed easily.  Psychiatric/Behavioral: Negative for confusion.    Physical Exam Updated Vital Signs BP (!) 155/87 (BP Location: Left Arm)   Pulse 98   Temp 98.7 F (37.1 C) (Oral)   Resp 18   Ht 1.524 m (5')   Wt 51.7 kg   LMP 07/22/2020   SpO2 100%   BMI 22.26 kg/m   Physical Exam Vitals and nursing note reviewed.  Constitutional:      General: She is not in acute distress.    Appearance: Normal appearance. She is well-developed and well-nourished.  HENT:     Head: Normocephalic and atraumatic.  Eyes:     Extraocular Movements: Extraocular movements intact.     Conjunctiva/sclera: Conjunctivae normal.     Pupils: Pupils are equal, round, and reactive to light.  Cardiovascular:     Rate and Rhythm: Normal rate and regular rhythm.     Heart sounds: No murmur heard.   Pulmonary:     Effort: Pulmonary effort is normal. No respiratory distress.     Breath sounds: Normal breath sounds.  Abdominal:     Palpations: Abdomen is soft.     Tenderness: There is no abdominal tenderness.  Musculoskeletal:        General: No edema. Normal range of motion.     Cervical back: Normal range of motion and neck supple.  Skin:    General: Skin is warm and dry.     Capillary Refill:  Capillary refill takes less than 2 seconds.  Neurological:     General: No focal deficit present.     Mental Status: She is alert and oriented to person, place, and time.     Cranial Nerves: Cranial nerve deficit present.     Sensory: No sensory deficit.     Motor: Weakness present.     Comments: Seems to be peripheral vision deficit. There is weakness to the right lower extremity compared to left. Subjective numbness to the right arm. But no weakness of any of the upper extremities.  Psychiatric:        Mood and Affect: Mood and affect normal.  ED Results / Procedures / Treatments   Labs (all labs ordered are listed, but only abnormal results are displayed) Labs Reviewed  COMPREHENSIVE METABOLIC PANEL - Abnormal; Notable for the following components:      Result Value   Calcium 8.8 (*)    Alkaline Phosphatase 34 (*)    All other components within normal limits  CBC  DIFFERENTIAL  PREGNANCY, URINE  ETHANOL  PROTIME-INR  APTT  COMPREHENSIVE METABOLIC PANEL  RAPID URINE DRUG SCREEN, HOSP PERFORMED  URINALYSIS, ROUTINE W REFLEX MICROSCOPIC  CBG MONITORING, ED  TROPONIN I (HIGH SENSITIVITY)    EKG None  Radiology CT HEAD CODE STROKE WO CONTRAST  Result Date: 08/05/2020 CLINICAL DATA:  Code stroke. 49 year old female with dizziness, right hand numbness and tremor, word finding difficulty. EXAM: CT HEAD WITHOUT CONTRAST TECHNIQUE: Contiguous axial images were obtained from the base of the skull through the vertex without intravenous contrast. COMPARISON:  Brain MRI and head CT 03/21/2019. FINDINGS: Brain: Cerebral volume appears stable and within normal limits. No midline shift, ventriculomegaly, mass effect, evidence of mass lesion, intracranial hemorrhage or evidence of cortically based acute infarction. Gray-white matter differentiation is within normal limits throughout the brain. Small perivascular space at the left inferior lentiform (normal variant). Vascular: No  suspicious intracranial vascular hyperdensity. Skull: Negative. Sinuses/Orbits: Tympanic cavities and Visualized paranasal sinuses and mastoids are clear. Other: Visualized orbits and scalp soft tissues are within normal limits. ASPECTS [facility] Stroke Program Early CT Score) Total score (0-10 with 10 being normal): 10 IMPRESSION: Stable and normal noncontrast CT appearance of the brain. ASPECTS 10. Study discussed by telephone with Dr. Vanetta Mulders on 08/05/2020 at 20:08 . Electronically Signed   By: Odessa Fleming M.D.   On: 08/05/2020 20:09    Procedures Procedures (including critical care time)  CRITICAL CARE Performed by: Vanetta Mulders Total critical care time: 60 minutes Critical care time was exclusive of separately billable procedures and treating other patients. Critical care was necessary to treat or prevent imminent or life-threatening deterioration. Critical care was time spent personally by me on the following activities: development of treatment plan with patient and/or surrogate as well as nursing, discussions with consultants, evaluation of patient's response to treatment, examination of patient, obtaining history from patient or surrogate, ordering and performing treatments and interventions, ordering and review of laboratory studies, ordering and review of radiographic studies, pulse oximetry and re-evaluation of patient's condition.   Medications Ordered in ED Medications  alteplase (ACTIVASE) 1 mg/mL infusion 46.5 mg (46.5 mg Intravenous New Bag/Given 08/05/20 2032)    Followed by  0.9 %  sodium chloride infusion (has no administration in time range)  alteplase (ACTIVASE) 1 mg/mL injection (has no administration in time range)  iohexol (OMNIPAQUE) 350 MG/ML injection 100 mL (has no administration in time range)    ED Course  I have reviewed the triage vital signs and the nursing notes.  Pertinent labs & imaging results that were available during my care of the patient were  reviewed by me and considered in my medical decision making (see chart for details).    MDM Rules/Calculators/A&P                          Code stroke initiated. Head CT without acute findings. Patient seen by [facility] teleneurology service. Felt patient meets criteria for TPA. tPA will be given. Patient will be admitted to [facility] they will arrange the admission. CT angio head and neck has been ordered  and results pending. Covid testing negative.  Following TPA patient peripheral vision is better. Still has weakness to the right lower extremity. Patient is currently getting CT angio.     Final Clinical Impression(s) / ED Diagnoses Final diagnoses:  Cerebrovascular accident (CVA), unspecified mechanism [facility])    Rx / DC Orders ED Discharge Orders    None       Vanetta Mulders, MD 08/05/20 2138    Vanetta Mulders, MD 08/05/20 2142    Vanetta Mulders, MD 08/05/20 2143   Teleneurologist had put her in for progressive bed.  Noel Gerold will not excepted post TPA patient to a progressive bed.  Discussed with Dr. Thomasena Edis in hospital neuro hospitalist tonight.  Patient will need ICU admission bed control stillness there is no ICU bed.  Discussed with Dr. Silverio Lay emergency department, he will accept patient.  When patient gets to [facility] they will let Dr. Thomasena Edis know.      Vanetta Mulders, MD 08/05/20 2233  About 20 minutes ago patient developed anterior headache.  Her neuro exam is actually showing almost complete resolution of the numbness in the right upper extremity.  And weakness is continued to improve in the right lower extremity.  Visions improved as well.  Patient sent over for quick CT scan of head due to the onset of the headache.  I reviewed it myself.  No evidence of any major hemorrhage at this time.  [facility] is here to transport patient to the [facility] emergency department since there is no ICU beds available.    Vanetta Mulders, MD 08/05/20 2248

## 2020-08-05 NOTE — ED Notes (Signed)
In room to do assessment.  Now c/o 9/10 headache and pupils noted to be 60mm and reactive to light.  Previously pupils were 58mm and reactive.  Physician notified.  Order received to repeat CT.

## 2020-08-05 NOTE — ED Triage Notes (Signed)
Pt arrives stating around 1700 she became dizzy like the room was spinning. Then started having right hand numbness and tremors. States has trouble finding words. Trouble lifting bilateral legs in triage. No unilateral weakness noted

## 2020-08-05 NOTE — ED Notes (Addendum)
Spoke to [place], [place] about patient during triage. States to order basic labs and troponin

## 2020-08-05 NOTE — ED Notes (Signed)
Code Stroke initiated on Stroke cart for teleneuro per EDP instruction.

## 2020-08-05 NOTE — H&P (Signed)
[facility] TELE[facility]ICINE  CONSULT   Date of service: August 05, 2020 Patient Name: Natalie Ford MRN:  [id number] DOB:  [birth year] Requesting Provider: Vanetta Mulders Location of the provider: Home Office in [facility] Location of the patient: [facility], room number 5. Reason for consult: "Stroke code"   This consult was provided via telemedicine with 2-way video and audio communication. The patient/family was informed that care would be provided in this way and agreed to receive care in this manner.    History of Present Illness  Natalie Ford is a 49 y.o. female with PMH significant for Allergic rhinitis, COVID-19, Dyspepsia, Hypercholesteremia, IBS (irritable bowel syndrome), and Lumbosacral strain who presents with  A sudden onset episode of R arm numbness and weakness, R upper quadrant vision deficit and spinning with a sensation of her feet are not on her.  The spinning and RUE weakness improved, she continued to have R vision deficit on peripheral vision testing. She does endorse that she has had blurred vision for a month but the R upper quadrant vision loss is new and started at 1700 on 08/05/20.  Even thou her NIHSS was low, given that vision loss can be debilitating, I offered her tPA.  Discussed risks and benefits of tPA includign about a 30% chance of improvement and 2-3% risk of ICH and about a total of 6% risk of systemic bleeding.   Stroke Measures   Last Known Well: 1700 on 08/05/20 TPA Given: Yes, bolus started at 1735 IR Thrombectomy: No LVO mRS: Modified Rankin Scale: 0-Completely asymptomatic and back to baseline post- stroke Time of teleneurologist evaluation: 1959   Vitals   Vitals:   08/05/20 1847 08/05/20 1855 08/05/20 2023  BP: (!) 155/87    Pulse: 98    Resp: 18    Temp: 98.7 F (37.1 C)    TempSrc: Oral    SpO2: 100%    Weight:  52.2 kg 51.7 kg  Height:  5' (1.524 m)      Body mass index is 22.26  kg/m.   Tele-neuro Exam and NIHSS   NIHSS components Score: Comment  1a Level of Conscious 0[x]  1[]  2[]  3[]      1b LOC Questions 0[x]  1[]  2[]       1c LOC Commands 0[x]  1[]  2[]       2 Best Gaze 0[x]  1[]  2[]       3 Visual 0[]  1[x]  2[]  3[]      4 Facial Palsy 0[]  1[]  2[]  3[]      5a Motor Arm - left 0[x]  1[]  2[]  3[]  4[]  UN[]    5b Motor Arm - Right 0[x]  1[]  2[]  3[]  4[]  UN[]    6a Motor Leg - Left 0[x]  1[]  2[]  3[]  4[]  UN[]    6b Motor Leg - Right 0[x]  1[]  2[]  3[]  4[]  UN[]    7 Limb Ataxia 0[x]  1[]  2[]  3[]  UN[]     8 Sensory 0[]  1[x]  2[]  UN[]    Decreased to 80% on light touch in RUE, R face and R Leg  9 Best Language 0[x]  1[]  2[]  3[]      10 Dysarthria 0[x]  1[]  2[]  UN[]      11 Extinct. and Inattention 0[x]  1[]  2[]       TOTAL: 2     Imaging and Labs   CBC:  Recent Labs  Lab 08/05/20 1912  WBC 9.9  NEUTROABS 6.5  HGB 13.7  HCT 40.0  MCV 98.3  PLT 291    Basic Metabolic Panel:  Lab Results  Component Value  Date   NA 135 08/05/2020   K 3.9 08/05/2020   CO2 23 08/05/2020   GLUCOSE 97 08/05/2020   BUN 10 08/05/2020   CREATININE 0.80 08/05/2020   CALCIUM 8.8 (L) 08/05/2020   GFRNONAA >60 08/05/2020   GFRAA 91 11/15/2019   Lipid Panel:  Lab Results  Component Value Date   LDLCALC 118 (H) 10/02/2017   HgbA1c: No results found for: HGBA1C Urine Drug Screen: No results found for: LABOPIA, COCAINSCRNUR, LABBENZ, AMPHETMU, THCU, LABBARB  Alcohol Level No results found for: [facility]  Results for orders placed during the hospital encounter of 08/05/20  CTH without contrast: CTH was negative for a large hypodensity concerning for a large territory infarct or hyperdensity concerning for an ICH  CT angio Head and neck: No LVO on CT angio   Impression   Natalie Ford is a 49 y.o. female with PMH significant for Allergic rhinitis, COVID-19, Dyspepsia, Hypercholesteremia, IBS (irritable bowel syndrome), and Lumbosacral strain who presents with sudden onset episode of R arm numbness  and weakness, R upper quadrant vision deficit and spinning with a sensation of her feet are not on her with a LKW of 1700 on 08/05/20. Given persistence of vision deficit, tPA was offered at 20:20 and orders for tPA placed at 20:24 after patient's weight was verified, tPA bolus started at 20:35. . Her limited tele-neurologic examination is notable for R face, RUE and RLE numbness to 80% compared to left side and R superior temporal quadrant vision loss.  No LVO on CT angio, therefore not a candidate for any intervention.  Recommendations   Plan: - Admit to [facility]. - Frequent NeuroChecks for post tPA care per stroke unit protocol: - Initial CTH demonstrated no acute hemorrhage or mass - MRI Brain - pending - CTA - pending - TTE - pending - Lipid Panel: LDL - pending - HbA1c: pending - Antithrombotic: Start ASA 81 mg daily if 24 h CTH does not show acute hemorrhage - DVT prophylaxis: SCDs. Pharmacologic prophylaxis if 24 h CTH does not demonstrate acute hemorrhage - Systolic Blood Pressure goal: < 180 mm Hg - Telemetry monitoring for arrhythmia: 72 hours - Swallow screen - ordered - PT/OT/SLP consults  ______________________________________________________________________    This patient is receiving care for possible acute neurological changes. There was 45 minutes of care by this provider at the time of service, including time for direct evaluation via telemedicine, review of medical records, imaging studies and discussion of findings with providers, the patient and/or family.  Erick Blinks [facility] Pager Number [phone]  If 7pm- 7am, please page neurology on call as listed in AMION.

## 2020-08-05 NOTE — Progress Notes (Signed)
Code Stroek Times:   1955 We called ER to state readiness after discovery of order  1957 Pt arrived  2000 Exam begun 2001 Exam finished, images sent  2002 [facility] called 2003 Exam completed in Epic  Delay in scanning bc had to remove numerous ear piercing's

## 2020-08-06 ENCOUNTER — Inpatient Hospital Stay (HOSPITAL_COMMUNITY): Payer: No Typology Code available for payment source

## 2020-08-06 DIAGNOSIS — I6389 Other cerebral infarction: Secondary | ICD-10-CM

## 2020-08-06 DIAGNOSIS — Z9282 Status post administration of tPA (rtPA) in a different facility within the last 24 hours prior to admission to current facility: Secondary | ICD-10-CM

## 2020-08-06 DIAGNOSIS — Z8616 Personal history of COVID-19: Secondary | ICD-10-CM

## 2020-08-06 DIAGNOSIS — E7800 Pure hypercholesterolemia, unspecified: Secondary | ICD-10-CM

## 2020-08-06 DIAGNOSIS — G43109 Migraine with aura, not intractable, without status migrainosus: Principal | ICD-10-CM

## 2020-08-06 LAB — ECHOCARDIOGRAM COMPLETE
Area-P 1/2: 5.66 cm2
Height: 60 in
S' Lateral: 2.8 cm
Weight: 1823.65 oz

## 2020-08-06 LAB — LIPID PANEL
Cholesterol: 229 mg/dL — ABNORMAL HIGH (ref 0–200)
Cholesterol: 207 mg/dL — ABNORMAL HIGH (ref 0–200)
HDL: 87 mg/dL (ref >40)
HDL: 73 mg/dL (ref >40)
LDL Cholesterol: 128 mg/dL — ABNORMAL HIGH (ref 0–99)
LDL Cholesterol: 117 mg/dL — ABNORMAL HIGH (ref 0–99)
Total CHOL/HDL Ratio: 2.6 RATIO
Total CHOL/HDL Ratio: 2.8 RATIO
Triglycerides: 70 mg/dL (ref <150)
Triglycerides: 85 mg/dL (ref <150)
VLDL: 14 mg/dL (ref 0–40)
VLDL: 17 mg/dL (ref 0–40)

## 2020-08-06 LAB — HEMOGLOBIN A1C
Hgb A1c MFr Bld: 4.9 % (ref 4.8–5.6)
Hgb A1c MFr Bld: 4.8 % (ref 4.8–5.6)
Mean Plasma Glucose: 93.93 mg/dL
Mean Plasma Glucose: 91.06 mg/dL

## 2020-08-06 MED ORDER — MODAFINIL 100 MG PO TABS
200.0000 mg | ORAL_TABLET | Freq: Every day | ORAL | Status: DC
  Administered 2020-08-06 – 2020-08-07 (×2): 200 mg via ORAL
  Filled 2020-08-06: qty 2
  Filled 2020-08-06: qty 1
  Filled 2020-08-06: qty 2

## 2020-08-06 MED ORDER — ALBUTEROL SULFATE HFA 108 (90 BASE) MCG/ACT IN AERS: 1.0000 | INHALATION_SPRAY | Freq: Four times a day (QID) | RESPIRATORY_TRACT | Status: DC | PRN

## 2020-08-06 MED ORDER — SODIUM CHLORIDE 0.9 % IV SOLN: INTRAVENOUS | Status: DC

## 2020-08-06 MED ORDER — SPIRONOLACTONE 25 MG PO TABS
50.0000 mg | ORAL_TABLET | Freq: Every day | ORAL | Status: DC
  Administered 2020-08-06 – 2020-08-07 (×2): 50 mg via ORAL
  Filled 2020-08-06 (×2): qty 2

## 2020-08-06 MED ORDER — PROPRANOLOL HCL 20 MG PO TABS
20.0000 mg | ORAL_TABLET | Freq: Every day | ORAL | Status: DC
  Administered 2020-08-06 – 2020-08-07 (×2): 20 mg via ORAL
  Filled 2020-08-06 (×2): qty 1

## 2020-08-06 MED ORDER — FLUTICASONE PROPIONATE 50 MCG/ACT NA SUSP
2.0000 | Freq: Every day | NASAL | Status: DC
  Administered 2020-08-06 – 2020-08-07 (×2): 2 via NASAL
  Filled 2020-08-06: qty 16

## 2020-08-06 MED ORDER — ALBUTEROL SULFATE (2.5 MG/3ML) 0.083% IN NEBU: 2.5000 mg | INHALATION_SOLUTION | Freq: Four times a day (QID) | RESPIRATORY_TRACT | Status: DC | PRN

## 2020-08-06 MED ORDER — DIVALPROEX SODIUM ER 500 MG PO TB24: 500.0000 mg | ORAL_TABLET | Freq: Every day | ORAL | Status: DC

## 2020-08-06 MED ORDER — PREDNISOLONE 5 MG PO TABS
10.0000 mg | ORAL_TABLET | Freq: Every day | ORAL | Status: DC
  Administered 2020-08-06 – 2020-08-07 (×2): 10 mg via ORAL
  Filled 2020-08-06 (×3): qty 2

## 2020-08-06 MED ORDER — ATORVASTATIN CALCIUM 10 MG PO TABS
20.0000 mg | ORAL_TABLET | Freq: Every day | ORAL | Status: DC
  Filled 2020-08-06 (×2): qty 2

## 2020-08-06 MED ORDER — MULTIVITAMINS PO CAPS: 1.0000 | ORAL_CAPSULE | Freq: Every day | ORAL | Status: DC

## 2020-08-06 MED ORDER — ADULT MULTIVITAMIN W/MINERALS CH
1.0000 | ORAL_TABLET | Freq: Every day | ORAL | Status: DC
  Administered 2020-08-06: 1 via ORAL
  Filled 2020-08-06 (×2): qty 1

## 2020-08-06 MED ORDER — BUPROPION HCL ER (XL) 150 MG PO TB24
450.0000 mg | ORAL_TABLET | Freq: Every day | ORAL | Status: DC
  Administered 2020-08-06 – 2020-08-07 (×2): 450 mg via ORAL
  Filled 2020-08-06 (×3): qty 3

## 2020-08-06 MED ORDER — BUTALBITAL-APAP-CAFFEINE 50-325-40 MG PO TABS
1.0000 | ORAL_TABLET | Freq: Two times a day (BID) | ORAL | Status: DC | PRN
  Administered 2020-08-06 – 2020-08-07 (×2): 1 via ORAL
  Filled 2020-08-06 (×2): qty 1

## 2020-08-06 MED FILL — Fentanyl Citrate Preservative Free (PF) Inj 100 MCG/2ML: INTRAMUSCULAR | Qty: 2 | Status: AC

## 2020-08-06 NOTE — Progress Notes (Signed)
Pt complained of headache of 9/10.  Had previously c/o of h/a in ER and was medicated with fioricet.  States little improvement and that it began after CTA with contrast.  Dr. Roda Shutters notified and received order for depakote, however, patient refused stating that she had never taken it and will wait for fioricet.  24 hr CT scheduled for 8p.m.

## 2020-08-06 NOTE — Progress Notes (Signed)
STROKE TEAM PROGRESS NOTE   INTERVAL HISTORY Her daughter is at the bedside.  Patient stated that last night after CT head and neck, she started have frontal headache, throbbing, not improving with Tylenol.  Received 1 dose of the Fioricet, still not getting better.  Still feeling right arm and leg decreased light touch sensation, feeling tingling.  However, visual field deficit resolved, but still have some blurry vision started 4 weeks ago.  MRI negative for stroke.  Vitals:   08/06/20 0729 08/06/20 0730 08/06/20 0830 08/06/20 0900  BP: 96/66 100/72 98/69 (!) 86/67  Pulse: 76 79 73 73  Resp: Temp: 98.8 F (37.1 C)   98.8 F (37.1 C)  TempSrc: Oral   Oral  SpO2: 99% 100% 97% 100%  Weight:      Height:       CBC:  Recent Labs  Lab 08/05/20 1912  WBC 9.9  NEUTROABS 6.5  HGB 13.7  HCT 40.0  MCV 98.3  PLT 291   Basic Metabolic Panel:  Recent Labs  Lab 08/05/20 1912 08/05/20 2000  NA 135 136  K 3.9 3.7  CL 102 102  CO2 23 24  GLUCOSE 97 90  BUN 10 10  CREATININE 0.80 0.87  CALCIUM 8.8* 9.0   Lipid Panel:  Recent Labs  Lab 08/06/20 0336  CHOL 207*  TRIG 85  HDL 73  CHOLHDL 2.8  VLDL 17  LDLCALC 191*   HgbA1c:  Recent Labs  Lab 08/06/20 0336  HGBA1C 4.8   Urine Drug Screen:  Recent Labs  Lab 08/05/20 1919  LABOPIA NONE DETECTED  COCAINSCRNUR NONE DETECTED  LABBENZ NONE DETECTED  AMPHETMU NONE DETECTED  THCU NONE DETECTED  LABBARB NONE DETECTED    Alcohol Level  Recent Labs  Lab 08/05/20 2000  ETH <10    IMAGING past 24 hours CT HEAD WO CONTRAST  Result Date: 08/06/2020 CLINICAL DATA:  Stroke, status post IV tPA, new onset headache EXAM: CT HEAD WITHOUT CONTRAST TECHNIQUE: Contiguous axial images were obtained from the base of the skull through the vertex without intravenous contrast. COMPARISON:  08/05/2020 FINDINGS: Brain: Normal anatomic configuration. Dilated perivascular space again noted within the left basal ganglia. No  abnormal intra or extra-axial mass lesion or fluid collection. No abnormal mass effect or midline shift. No evidence of acute intracranial hemorrhage or infarct. Ventricular size is normal. Cerebellum unremarkable. Vascular: Unremarkable Skull: Intact Sinuses/Orbits: Paranasal sinuses are clear. Orbits are unremarkable. Other: Mastoid air cells and middle ear cavities are clear. IMPRESSION: No evidence of acute intracranial hemorrhage or infarct. No interval change. Electronically Signed   By: Helyn Numbers MD   On: 08/06/2020 00:20   CT Head Wo Contrast  Result Date: 08/05/2020 CLINICAL DATA:  Headache status post tPA EXAM: CT HEAD WITHOUT CONTRAST TECHNIQUE: Contiguous axial images were obtained from the base of the skull through the vertex without intravenous contrast. COMPARISON:  None. FINDINGS: Brain: No acute intracranial abnormality. Specifically, no hemorrhage, hydrocephalus, mass lesion, acute infarction, or significant intracranial injury. Vascular: Contrast material seen within the dural sinuses and vessels from prior contrasted study. Skull: Insert calvarial Sinuses/Orbits: Visualized paranasal sinuses and mastoids clear. Orbital soft tissues unremarkable. Other: None IMPRESSION: No acute intracranial abnormality. Electronically Signed   By: Charlett Nose M.D.   On: 08/05/2020 22:51   MR BRAIN WO CONTRAST  Result Date: 08/06/2020 CLINICAL DATA:  Initial evaluation for acute neuro deficit, stroke suspected. EXAM: MRI HEAD WITHOUT CONTRAST TECHNIQUE: Multiplanar, multiecho  pulse sequences of the brain and surrounding structures were obtained without intravenous contrast. COMPARISON:  Comparison made with prior CT from earlier the same day as well as previous CT and CTA from 08/05/2020. FINDINGS: Brain: Cerebral volume within normal limits for age. No focal parenchymal signal abnormality or significant cerebral white matter disease. Dilated perivascular space noted at the inferior left basal  ganglia. No abnormal foci of restricted diffusion to suggest acute or subacute ischemia. Gray-white matter differentiation well maintained. No encephalomalacia to suggest chronic cortical infarction. No foci of susceptibility artifact to suggest acute or chronic intracranial hemorrhage. No mass lesion, midline shift or mass effect. Ventricles normal size without hydrocephalus. No extra-axial fluid collection. Pituitary gland and suprasellar region within normal limits. Midline structures intact and normal. Vascular: Major intracranial vascular flow voids are well maintained and normal. Skull and upper cervical spine: Craniocervical junction within normal limits. Upper cervical spine normal. Bone marrow signal intensity within normal limits. No scalp soft tissue abnormality. Sinuses/Orbits: Globes and orbital soft tissues within normal limits. Mild mucosal thickening noted within the ethmoidal air cells. Paranasal sinuses are otherwise clear. No mastoid effusion. Inner ear structures grossly normal. Other: None. IMPRESSION: Normal brain MRI. No acute intracranial infarct or other abnormality. Electronically Signed   By: Rise Mu M.D.   On: 08/06/2020 02:22   CT HEAD CODE STROKE WO CONTRAST  Result Date: 08/05/2020 CLINICAL DATA:  Code stroke. 49 year old female with dizziness, right hand numbness and tremor, word finding difficulty. EXAM: CT HEAD WITHOUT CONTRAST TECHNIQUE: Contiguous axial images were obtained from the base of the skull through the vertex without intravenous contrast. COMPARISON:  Brain MRI and head CT 03/21/2019. FINDINGS: Brain: Cerebral volume appears stable and within normal limits. No midline shift, ventriculomegaly, mass effect, evidence of mass lesion, intracranial hemorrhage or evidence of cortically based acute infarction. Gray-white matter differentiation is within normal limits throughout the brain. Small perivascular space at the left inferior lentiform (normal variant).  Vascular: No suspicious intracranial vascular hyperdensity. Skull: Negative. Sinuses/Orbits: Tympanic cavities and Visualized paranasal sinuses and mastoids are clear. Other: Visualized orbits and scalp soft tissues are within normal limits. ASPECTS [facility] Stroke Program Early CT Score) Total score (0-10 with 10 being normal): 10 IMPRESSION: Stable and normal noncontrast CT appearance of the brain. ASPECTS 10. Study discussed by telephone with Dr. Vanetta Mulders on 08/05/2020 at 20:08 . Electronically Signed   By: Odessa Fleming M.D.   On: 08/05/2020 20:09   CT ANGIO HEAD CODE STROKE  Result Date: 08/05/2020 CLINICAL DATA:  Initial evaluation for acute stroke, right-sided weakness. EXAM: CT ANGIOGRAPHY HEAD AND NECK TECHNIQUE: Multidetector CT imaging of the head and neck was performed using the standard protocol during bolus administration of intravenous contrast. Multiplanar CT image reconstructions and MIPs were obtained to evaluate the vascular anatomy. Carotid stenosis measurements (when applicable) are obtained utilizing NASCET criteria, using the distal internal carotid diameter as the denominator. CONTRAST:  OMNIPAQUE IOHEXOL 350 MG/ML SOLN COMPARISON:  Prior head CT from earlier the same day. FINDINGS: CTA NECK FINDINGS Aortic arch: Visualized aortic arch of normal caliber with normal 3 vessel morphology. No hemodynamically significant stenosis about the origin of the great vessels. Right carotid system: Right common and internal carotid arteries widely patent without stenosis, dissection or occlusion. Right ICA mildly tortuous just prior to the skull base. Left carotid system: Left common and internal carotid arteries widely patent without stenosis, dissection or occlusion. Left ICA tortuous just prior to the skull base. Vertebral arteries: Both  vertebral arteries arise from subclavian arteries. No proximal subclavian artery stenosis. Vertebral arteries widely patent without stenosis, dissection or  occlusion. Skeleton: No visible acute osseous abnormality. No discrete or worrisome osseous lesions. Moderate cervical spondylosis noted at C5-6 and C6-7 without high-grade stenosis. Other neck: No other acute soft tissue abnormality within the neck. No mass or adenopathy. Upper chest: Visualized upper chest demonstrates no acute finding. Review of the MIP images confirms the above findings CTA HEAD FINDINGS Anterior circulation: Both internal carotid arteries widely patent to the termini without stenosis. A1 segments widely patent. Normal anterior communicating artery complex. Anterior cerebral arteries widely patent their distal aspects without stenosis. No M1 stenosis or occlusion. Normal MCA bifurcations. Distal MCA branches well perfused and symmetric. Posterior circulation: Both V4 segments widely patent to the vertebrobasilar junction without stenosis. Both PICA origins patent and normal. Basilar widely patent to its distal aspect without stenosis. Superior cerebral arteries patent bilaterally. Both PCAs primarily supplied via the basilar well perfused or distal aspects. Venous sinuses: Patent. Anatomic variants: None significant.  No aneurysm. Review of the MIP images confirms the above findings IMPRESSION: Normal CTA of the head and neck. No large vessel occlusion, hemodynamically significant stenosis, or other acute vascular abnormality. Case discussed by telephone at approximately 10:15 p.m. on 08/05/2020 with provider [facility]. Electronically Signed   By: Rise Mu M.D.   On: 08/05/2020 23:06   CT ANGIO NECK CODE STROKE  Result Date: 08/05/2020 CLINICAL DATA:  Initial evaluation for acute stroke, right-sided weakness. EXAM: CT ANGIOGRAPHY HEAD AND NECK TECHNIQUE: Multidetector CT imaging of the head and neck was performed using the standard protocol during bolus administration of intravenous contrast. Multiplanar CT image reconstructions and MIPs were obtained to evaluate the  vascular anatomy. Carotid stenosis measurements (when applicable) are obtained utilizing NASCET criteria, using the distal internal carotid diameter as the denominator. CONTRAST:  OMNIPAQUE IOHEXOL 350 MG/ML SOLN COMPARISON:  Prior head CT from earlier the same day. FINDINGS: CTA NECK FINDINGS Aortic arch: Visualized aortic arch of normal caliber with normal 3 vessel morphology. No hemodynamically significant stenosis about the origin of the great vessels. Right carotid system: Right common and internal carotid arteries widely patent without stenosis, dissection or occlusion. Right ICA mildly tortuous just prior to the skull base. Left carotid system: Left common and internal carotid arteries widely patent without stenosis, dissection or occlusion. Left ICA tortuous just prior to the skull base. Vertebral arteries: Both vertebral arteries arise from subclavian arteries. No proximal subclavian artery stenosis. Vertebral arteries widely patent without stenosis, dissection or occlusion. Skeleton: No visible acute osseous abnormality. No discrete or worrisome osseous lesions. Moderate cervical spondylosis noted at C5-6 and C6-7 without high-grade stenosis. Other neck: No other acute soft tissue abnormality within the neck. No mass or adenopathy. Upper chest: Visualized upper chest demonstrates no acute finding. Review of the MIP images confirms the above findings CTA HEAD FINDINGS Anterior circulation: Both internal carotid arteries widely patent to the termini without stenosis. A1 segments widely patent. Normal anterior communicating artery complex. Anterior cerebral arteries widely patent their distal aspects without stenosis. No M1 stenosis or occlusion. Normal MCA bifurcations. Distal MCA branches well perfused and symmetric. Posterior circulation: Both V4 segments widely patent to the vertebrobasilar junction without stenosis. Both PICA origins patent and normal. Basilar widely patent to its distal aspect  without stenosis. Superior cerebral arteries patent bilaterally. Both PCAs primarily supplied via the basilar well perfused or distal aspects. Venous sinuses: Patent. Anatomic variants: None significant.  No aneurysm. Review of the MIP images confirms the above findings IMPRESSION: Normal CTA of the head and neck. No large vessel occlusion, hemodynamically significant stenosis, or other acute vascular abnormality. Case discussed by telephone at approximately 10:15 p.m. on 08/05/2020 with provider [facility]. Electronically Signed   By: Rise Mu M.D.   On: 08/05/2020 23:06    PHYSICAL EXAM  Temp:  [97.7 F (36.5 C)-98.8 F (37.1 C)] 97.7 F (36.5 C) (12/13 1451) Pulse Rate:  [68-99] 68 (12/13 1451) Resp:  [12-26] 16 (12/13 1451) BP: (82-134)/(57-92) 127/92 (12/13 1451) SpO2:  [95 %-100 %] 99 % (12/13 1451) Weight:  [51.7 kg] 51.7 kg (12/12 2023)  General - Well nourished, well developed, in no apparent distress.  Ophthalmologic - fundi not visualized due to noncooperation.  Cardiovascular - Regular rhythm and rate.  Mental Status -  Level of arousal and orientation to time, place, and person were intact. Language including expression, naming, repetition, comprehension was assessed and found intact. Fund of Knowledge was assessed and was intact.  Cranial Nerves II - XII - II - Visual field intact OU. III, IV, VI - Extraocular movements intact. V - Facial sensation intact bilaterally. VII - Facial movement intact bilaterally. VIII - Hearing & vestibular intact bilaterally. X - Palate elevates symmetrically. XI - Chin turning & shoulder shrug intact bilaterally. XII - Tongue protrusion intact.  Motor Strength - The patient's strength was normal in all extremities and pronator drift was absent.  Some giveaway weakness low checking bilateral foot DF/PF. Bulk was normal and fasciculations were absent.   Motor Tone - Muscle tone was assessed at the neck and appendages  and was normal.  Reflexes - The patient's reflexes were symmetrical in all extremities and she had no pathological reflexes.  Sensory - Light touch, temperature/pinprick were assessed and were decreased light touch sensation on right upper and lower extremity.    Coordination - The patient had normal movements in the hands with no ataxia or dysmetria.  Tremor was absent.  Gait and Station - deferred.   ASSESSMENT/PLAN [name] is a 49 y.o. female with history of Allergic rhinitis, COVID-19, Dyspepsia, Hypercholesteremia, IBS (irritable bowel syndrome), and Lumbosacral strain with multiple episodes of care presenting with transient R arm numbness and weakness and a spinning sensation, along with RUQ vision deficit which continued. Received tPA 08/05/2020 at 2035.  Likely complicated migraine, less likely TIA Stroke-like symptoms s/p tPA    Code Stroke CT head No acute abnormality. ASPECTS 10.     CTA head & neck Unremarkable   CT head x 2 for HA no acute abnormality.  MRI  Unremarkable   CT head pending at 24h of tPA  2D Echo EF 55-60%. No source of embolus   LDL 117  HgbA1c 4.8  VTE prophylaxis - SCDs   No antithrombotic prior to admission, now on No antithrombotic as within 24h of tPA administration.    Therapy recommendations:  No therapy needs  Disposition:  Return home  Hx of COVID  COVID infection March 2020. Myalgias, SOB and palpitations following.   Suspected reinfection Jan 2021.  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 Followed by  Pulmonology  Ongoing low-grade fevers w/ SOB. Followed by [facility]  Hospital day # 1  Marvel Plan, MD PhD Stroke  Neurology 08/06/2020 7:45 PM    To contact Stroke Continuity provider, please refer to [email or web address]. After hours, contact General Neurology

## 2020-08-06 NOTE — ED Notes (Signed)
C/o a headache 

## 2020-08-06 NOTE — Progress Notes (Signed)
  Echocardiogram 2D Echocardiogram with 3D has been performed.  Natalie Ford M 08/06/2020, 2:13 PM

## 2020-08-06 NOTE — ED Notes (Signed)
Still has a headache

## 2020-08-06 NOTE — ED Notes (Signed)
Speech is clear she moves all 4 extremities

## 2020-08-06 NOTE — Progress Notes (Signed)
Pt returned from CT °

## 2020-08-06 NOTE — ED Notes (Signed)
Pt given information sheet for [employer] and agrees to participate.

## 2020-08-06 NOTE — ED Notes (Signed)
bp is less call to the admitting doctor

## 2020-08-06 NOTE — ED Notes (Signed)
Med for headache

## 2020-08-06 NOTE — ED Notes (Signed)
Pt transferred from [facility] just arrived for report myself  Have not seen her yet and c-t and mri have been calling for her.  c-t arrived to take her there

## 2020-08-06 NOTE — ED Notes (Signed)
Pt has increased headache

## 2020-08-06 NOTE — ED Notes (Signed)
To mri 

## 2020-08-06 NOTE — ED Notes (Signed)
I was attempting a neuro check when the neurologist came in he remains at  The bedside  Mri has been calling wanting to take her  I need to do a nih before she leaves here

## 2020-08-06 NOTE — ED Notes (Signed)
Neuro has called back  bp ok  He has seen the sl difference in the lt pupil size  No change

## 2020-08-06 NOTE — Progress Notes (Addendum)
Pt admitted to 3W26 from ER; Post tpa on the [employer].  Spoke with Dr. Roda Shutters who verified patient was on q 4 hour neuro checks and NIHSS.  Pt has no complaints at present.  Alert and oriented. Pt does admit to slightly less sharp sensation to both rt foot and hand.  No change from ER. Tele box 8 placed on patient and CCMD called and second verification completed.  Bed alarm set; call bell within reach; pt verbalizes understanding of bed rest d/t tpa administration.

## 2020-08-07 DIAGNOSIS — G459 Transient cerebral ischemic attack, unspecified: Secondary | ICD-10-CM

## 2020-08-07 MED ORDER — ASPIRIN EC 81 MG PO TBEC
81.0000 mg | DELAYED_RELEASE_TABLET | Freq: Every day | ORAL | Status: DC
  Administered 2020-08-07: 81 mg via ORAL
  Filled 2020-08-07: qty 1

## 2020-08-07 MED ORDER — ASPIRIN 81 MG PO TBEC: 81.0000 mg | DELAYED_RELEASE_TABLET | Freq: Every day | ORAL | 11 refills | Status: AC

## 2020-08-07 MED ORDER — PANTOPRAZOLE SODIUM 40 MG PO TBEC: 40.0000 mg | DELAYED_RELEASE_TABLET | Freq: Every day | ORAL | Status: DC

## 2020-08-07 NOTE — Progress Notes (Signed)
Patient declined lipitor; wants to talk to the MD.

## 2020-08-07 NOTE — Evaluation (Signed)
Physical Therapy Evaluation and DISCHARGE Patient Details Name: Natalie Ford MRN: [id number] DOB: [birth year] Today's Date: 08/07/2020   History of Present Illness  49 yo female s/p TPA with symptoms of R arm numbness and weakness including R UQ vision deficits. MRI and CT noted as normal PMH post COVID syndrome, allergic rhinitis, IBS lumbosacral strain  And RA.   Clinical Impression  Pt admitted with above. Pt's R sided weakness and impaired sensation is no longer present. Pt functioning independently and at minimal falls risk as indicated my score of 24/24 on DGI. Pt with no further skilled PT needs at this time. ACUTE PT SIGNING OFF.    Follow Up Recommendations No PT follow up    Equipment Recommendations  None recommended by PT    Recommendations for Other Services       Precautions / Restrictions Precautions Precautions: None Restrictions Weight Bearing Restrictions: No      Mobility  Bed Mobility Overal bed mobility: Independent             General bed mobility comments: no difficulty with HOB flat    Transfers Overall transfer level: Independent Equipment used: None             General transfer comment: no difficulty  Ambulation/Gait Ambulation/Gait assistance: Independent Gait Distance (Feet): 600 Feet Assistive device: None Gait Pattern/deviations: WFL(Within Functional Limits) Gait velocity: wfl Gait velocity interpretation: >4.37 ft/sec, indicative of normal walking speed General Gait Details: no LOB, able to maintain balance against moderate pertebations in all directions  Stairs Stairs: Yes Stairs assistance: Modified independent (Device/Increase time) Stair Management: One rail Left;Alternating pattern;Forwards Number of Stairs: 10 General stair comments: no difficulty or LOB  Wheelchair Mobility    Modified Rankin (Stroke Patients Only) Modified Rankin (Stroke Patients Only) Pre-Morbid Rankin Score: No symptoms Modified  Rankin: No symptoms     Balance Overall balance assessment: Independent                               Standardized Balance Assessment Standardized Balance Assessment : Dynamic Gait Index   Dynamic Gait Index Level Surface: Normal Change in Gait Speed: Normal Gait with Horizontal Head Turns: Normal Gait with Vertical Head Turns: Normal Gait and Pivot Turn: Normal Step Over Obstacle: Normal Step Around Obstacles: Normal Steps: Normal Total Score: 24       Pertinent Vitals/Pain Pain Assessment: 0-10 Pain Location: generalized discomfort due to her RA and being in the bed so long Pain Descriptors / Indicators: Discomfort (stiff) Pain Intervention(s): Monitored during session    Home Living Family/patient expects to be discharged to:: Private residence Living Arrangements: Spouse/significant other Available Help at Discharge: Family;Available 24 hours/day (husband, 2 children 23yo and 38 yo) Type of Home: House Home Access: Stairs to enter Entrance Stairs-Rails: None Entrance Stairs-Number of Steps: 3 Home Layout: Two level Home Equipment: Shower seat      Prior Function Level of Independence: Independent         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     Hand Dominance   Dominant Hand: Right    Extremity/Trunk Assessment   Upper Extremity Assessment Upper Extremity Assessment: Overall WFL for tasks assessed    Lower Extremity Assessment Lower Extremity Assessment: Overall WFL for tasks assessed    Cervical / Trunk Assessment Cervical / Trunk Assessment: Normal  Communication   Communication: No difficulties  Cognition Arousal/Alertness: Awake/alert Behavior During Therapy: WFL for tasks assessed/performed  Overall Cognitive Status: Within Functional Limits for tasks assessed                                        General Comments General comments (skin integrity, edema, etc.): VSS, pt reports blurred vision starting 4  weeks ago, was suppose to see optamologist today, suspect it may be due to RA    Exercises     Assessment/Plan    PT Assessment Patent does not need any further PT services  PT Problem List         PT Treatment Interventions      PT Goals (Current goals can be found in the Care Plan section)  Acute Rehab PT Goals Patient Stated Goal: go home PT Goal Formulation: All assessment and education complete, DC therapy    Frequency     Barriers to discharge        Co-evaluation               AM-PAC PT "6 Clicks" Mobility  Outcome Measure Help needed turning from your back to your side while in a flat bed without using bedrails?: None Help needed moving from lying on your back to sitting on the side of a flat bed without using bedrails?: None Help needed moving to and from a bed to a chair (including a wheelchair)?: None Help needed standing up from a chair using your arms (e.g., wheelchair or bedside chair)?: None Help needed to walk in hospital room?: None Help needed climbing 3-5 steps with a railing? : None 6 Click Score: 24    End of Session Equipment Utilized During Treatment: Gait belt Activity Tolerance: Patient tolerated treatment well Patient left: in chair;with call bell/phone within reach Nurse Communication: Mobility status PT Visit Diagnosis: Unsteadiness on feet (R26.81)    Time: 9811-9147 PT Time Calculation (min) (ACUTE ONLY): 25 min   Charges:   PT Evaluation $PT Eval Low Complexity: 1 Low PT Treatments $Gait Training: 8-22 mins        Lewis Shock, PT, DPT Acute Rehabilitation Services Pager #: [phone] Office #: [phone]   Iona Hansen 08/07/2020, 10:38 AM

## 2020-08-07 NOTE — TOC Transition Note (Signed)
Transition of Care [facility]) - CM/SW Discharge Note   Patient Details  Name: Natalie Ford MRN: [id number] Date of Birth: 10/30/1970  Transition of Care [facility] CM/SW Contact:  Kermit Balo, RN Phone Number: 08/07/2020, 3:26 PM   Clinical Narrative:    Pt is discharging home with self care. No f/u per PT/OT and no DME needs.  Pt has transportation home.   Final next level of care: Home/Self Care Barriers to Discharge: No Barriers Identified   Patient Goals and CMS Choice        Discharge Placement                       Discharge Plan and Services                                     Social Determinants of Health (SDOH) Interventions     Readmission Risk Interventions No flowsheet data found.

## 2020-08-07 NOTE — Progress Notes (Signed)
Discharge instructions given and reviewed;RX sent electronically.  Stroke education completed; patient dressing to go home; awaiting her ride from family member.

## 2020-08-07 NOTE — Discharge Instructions (Signed)
Please review your medications and dosages at your next office visit with Dr Renne Crigler.

## 2020-08-07 NOTE — Evaluation (Signed)
SLP Cancellation Note  Patient Details Name: Natalie Ford MRN: [id number] DOB: 1970-09-27   Cancelled treatment:       Reason Eval/Treat Not Completed: SLP screened, no needs identified, will sign off Pt notes cognitive linguistic abilities at baseline; states recently completed outpatient SLP services and is aware of strategies   Jamear Carbonneau E Jaliyah Fotheringham MA, CCC-SLP 08/07/2020, 3:31 PM

## 2020-08-07 NOTE — Discharge Summary (Addendum)
Patient ID: Natalie Ford   MRN: [id number]      DOB: 07-Jul-1971  Date of Admission: 08/05/2020 Date of Discharge: 08/07/2020  Attending Physician:  Marvel Plan, MD, Stroke MD Consultant(s):    none Patient's PCP:  Merri Brunette, MD  DISCHARGE DIAGNOSIS:   Stroke like syndrome treated with tPA  Probable complicated migraine  Hx of COVID-19  Hypercholesteremia  IBS (irritable bowel syndrome)  Lumbosacral strain hx   Past Medical History:  Diagnosis Date  . Allergic rhinitis   . COVID-19   . Dyspepsia   . Hypercholesteremia   . IBS (irritable bowel syndrome)   . Lumbosacral strain    Past Surgical History:  Procedure Laterality Date  . CARPAL TUNNEL RELEASE Right   . CESAREAN SECTION     x 2    Family History Family History  Problem Relation Age of Onset  . Diabetes Mother   . Heart disease Mother   . Leukemia Father   . Stomach cancer Paternal Grandfather   . Kidney disease Paternal Aunt     Social History  reports that she has never smoked. She has never used smokeless tobacco. She reports current alcohol use. She reports that she does not use drugs.  Allergies as of 08/07/2020      Reactions   Penicillins    rash      Medication List    STOP taking these medications   predniSONE 5 MG tablet Commonly known as: DELTASONE     TAKE these medications   Advair HFA 230-21 MCG/ACT inhaler Generic drug: fluticasone-salmeterol Inhale 2 puffs into the lungs 2 (two) times daily.   albuterol 108 (90 Base) MCG/ACT inhaler Commonly known as: VENTOLIN HFA Inhale 1-2 puffs into the lungs every 6 (six) hours as needed for wheezing or shortness of breath.   aspirin 81 MG EC tablet Take 1 tablet (81 mg total) by mouth daily. Swallow whole. Start taking on: August 08, 2020   buPROPion 150 MG 24 hr tablet Commonly known as: WELLBUTRIN XL Take 450 mg by mouth daily.   cetirizine 10 MG tablet Commonly known as: ZYRTEC Take 10 mg by mouth daily as  needed for allergies.   Fish Oil 1000 MG Caps Take 2 capsules by mouth daily.   fluticasone 50 MCG/ACT nasal spray Commonly known as: FLONASE Place 2 sprays into both nostrils daily.   gabapentin 300 MG capsule Commonly known as: NEURONTIN Take 300 mg by mouth 3 (three) times daily as needed.   Humira 40 MG/0.8ML Pskt Generic drug: Adalimumab Inject 0.8 mls under the skin once every two weeks.   ibuprofen 200 MG tablet Commonly known as: ADVIL Take 200 mg by mouth every 6 (six) hours as needed for headache or moderate pain.   modafinil 200 MG tablet Commonly known as: PROVIGIL Take 200 mg by mouth daily.   multivitamin capsule Take 1 capsule by mouth at bedtime.   prednisoLONE 5 MG Tabs tablet Take 10 mg by mouth daily.   propranolol 20 MG tablet Commonly known as: INDERAL Take 20 mg by mouth daily.   spironolactone 50 MG tablet Commonly known as: ALDACTONE Take 50 mg by mouth daily.   SYSTANE HYDRATION PF OP Place 1 drop into both eyes every 3 (three) hours as needed (dry eyes).       HOME MEDICATIONS PRIOR TO ADMISSION Medications Prior to Admission  Medication Sig Dispense Refill  . Adalimumab (HUMIRA) 40 MG/0.8ML PSKT Inject 0.8 mls under the  skin once every two weeks. 2 each 2  . albuterol (VENTOLIN HFA) 108 (90 Base) MCG/ACT inhaler Inhale 1-2 puffs into the lungs every 6 (six) hours as needed for wheezing or shortness of breath. 18 g 5  . buPROPion (WELLBUTRIN XL) 150 MG 24 hr tablet Take 450 mg by mouth daily.    . cetirizine (ZYRTEC) 10 MG tablet Take 10 mg by mouth daily as needed for allergies.    . fluticasone (FLONASE) 50 MCG/ACT nasal spray Place 2 sprays into both nostrils daily.    . fluticasone-salmeterol (ADVAIR HFA) 230-21 MCG/ACT inhaler Inhale 2 puffs into the lungs 2 (two) times daily. 1 Inhaler 12  . gabapentin (NEURONTIN) 300 MG capsule Take 300 mg by mouth 3 (three) times daily as needed.    [name] ibuprofen (ADVIL,MOTRIN) 200 MG tablet Take  200 mg by mouth every 6 (six) hours as needed for headache or moderate pain.    . modafinil (PROVIGIL) 200 MG tablet Take 200 mg by mouth daily.    . Multiple Vitamin (MULTIVITAMIN) capsule Take 1 capsule by mouth at bedtime.    . Omega-3 Fatty Acids (FISH OIL) 1000 MG CAPS Take 2 capsules by mouth daily.    Bertram Gala Glycol-Propyl Glycol (SYSTANE HYDRATION PF OP) Place 1 drop into both eyes every 3 (three) hours as needed (dry eyes).    . prednisoLONE 5 MG TABS tablet Take 10 mg by mouth daily.    . propranolol (INDERAL) 20 MG tablet Take 20 mg by mouth daily.    [name] spironolactone (ALDACTONE) 50 MG tablet Take 50 mg by mouth daily.    . predniSONE (DELTASONE) 5 MG tablet Take by mouth. (Patient not taking: No sig reported)       HOSPITAL MEDICATIONS .  stroke: mapping our early stages of recovery book   Does not apply Once  . aspirin EC  81 mg Oral Daily  . atorvastatin  20 mg Oral Daily  . buPROPion  450 mg Oral Daily  . divalproex  500 mg Oral QHS  . fluticasone  2 spray Each Nare Daily  . modafinil  200 mg Oral Daily  . multivitamin with minerals  1 tablet Oral Daily  . pantoprazole  40 mg Oral QHS  . prednisoLONE  10 mg Oral Daily  . propranolol  20 mg Oral Daily  . spironolactone  50 mg Oral Daily    LABORATORY STUDIES CBC    Component Value Date/Time   WBC 9.9 08/05/2020 1912   RBC 4.07 08/05/2020 1912   HGB 13.7 08/05/2020 1912   HCT 40.0 08/05/2020 1912   PLT 291 08/05/2020 1912   MCV 98.3 08/05/2020 1912   MCH 33.7 08/05/2020 1912   MCHC 34.3 08/05/2020 1912   RDW 11.9 08/05/2020 1912   LYMPHSABS 2.3 08/05/2020 1912   MONOABS 0.9 08/05/2020 1912   EOSABS 0.1 08/05/2020 1912   BASOSABS 0.1 08/05/2020 1912   CMP    Component Value Date/Time   NA 136 08/05/2020 2000   K 3.7 08/05/2020 2000   CL 102 08/05/2020 2000   CO2 24 08/05/2020 2000   GLUCOSE 90 08/05/2020 2000   GLUCOSE 94 08/21/2006 0923   BUN 10 08/05/2020 2000   CREATININE 0.87 08/05/2020 2000    CREATININE 0.87 11/15/2019 1546   CALCIUM 9.0 08/05/2020 2000   PROT 6.9 08/05/2020 2000   ALBUMIN 4.6 08/05/2020 2000   AST 21 08/05/2020 2000   ALT 19 08/05/2020 2000   ALKPHOS 33 (L) 08/05/2020  2000   BILITOT 0.7 08/05/2020 2000   GFRNONAA >60 08/05/2020 2000   GFRNONAA 79 11/15/2019 1546   GFRAA 91 11/15/2019 1546   COAGS Lab Results  Component Value Date   INR 0.9 08/05/2020   INR 1.0 10/19/2019   Lipid Panel    Component Value Date/Time   CHOL 207 (H) 08/06/2020 0336   TRIG 85 08/06/2020 0336   TRIG 65 08/21/2006 0923   HDL 73 08/06/2020 0336   CHOLHDL 2.8 08/06/2020 0336   VLDL 17 08/06/2020 0336   LDLCALC 117 (H) 08/06/2020 0336   HgbA1C  Lab Results  Component Value Date   HGBA1C 4.8 08/06/2020   Urinalysis    Component Value Date/Time   COLORURINE YELLOW 08/05/2020 1919   APPEARANCEUR CLEAR 08/05/2020 1919   LABSPEC <1.005 (L) 08/05/2020 1919   PHURINE 6.5 08/05/2020 1919   GLUCOSEU NEGATIVE 08/05/2020 1919   GLUCOSEU NEGATIVE 04/03/2014 1429   HGBUR NEGATIVE 08/05/2020 1919   BILIRUBINUR NEGATIVE 08/05/2020 1919   KETONESUR NEGATIVE 08/05/2020 1919   PROTEINUR NEGATIVE 08/05/2020 1919   UROBILINOGEN 0.2 04/03/2014 1429   NITRITE NEGATIVE 08/05/2020 1919   LEUKOCYTESUR NEGATIVE 08/05/2020 1919   Urine Drug Screen     Component Value Date/Time   LABOPIA NONE DETECTED 08/05/2020 1919   COCAINSCRNUR NONE DETECTED 08/05/2020 1919   LABBENZ NONE DETECTED 08/05/2020 1919   AMPHETMU NONE DETECTED 08/05/2020 1919   THCU NONE DETECTED 08/05/2020 1919   LABBARB NONE DETECTED 08/05/2020 1919    Alcohol Level    Component Value Date/Time   ETH <10 08/05/2020 2000     SIGNIFICANT DIAGNOSTIC STUDIES  CT HEAD WO CONTRAST 08/06/2020 IMPRESSION:  Normal head CT.   CT HEAD WO CONTRAST 08/06/2020 IMPRESSION:  No evidence of acute intracranial hemorrhage or infarct. No interval change.   CT Head Wo Contrast  08/05/2020 IMPRESSION:  No acute  intracranial abnormality.   MR BRAIN WO CONTRAST 08/06/2020 IMPRESSION:  Normal brain MRI. No acute intracranial infarct or other abnormality.    CT HEAD CODE STROKE WO CONTRAST 08/05/2020 IMPRESSION:  Stable and normal noncontrast CT appearance of the brain. ASPECTS 10.   CT ANGIO HEAD CODE STROKE CT ANGIO NECK CODE STROKE 08/05/2020 IMPRESSION:  Normal CTA of the head and neck. No large vessel occlusion, hemodynamically significant stenosis, or other acute vascular abnormality.   ECHOCARDIOGRAM COMPLETE 08/06/2020 IMPRESSIONS   1. Left ventricular ejection fraction, by estimation, is 55 to 60%. The left ventricle has normal function. The left ventricle has no regional wall motion abnormalities. Left ventricular diastolic parameters were normal.   2. Right ventricular systolic function is normal. The right ventricular size is normal. There is normal pulmonary artery systolic pressure. The estimated right ventricular systolic pressure is 21.5 mmHg.   3. The mitral valve is normal in structure. Mild mitral valve regurgitation. No evidence of mitral stenosis.   4. The aortic valve is tricuspid. Aortic valve regurgitation is not visualized. No aortic stenosis is present.   5. The inferior vena cava is normal in size with greater than 50% respiratory variability, suggesting right atrial pressure of 3 mmHg.   HISTORY OF PRESENT ILLNESS Natalie Ford is a 49 y.o. female with PMH significant for Allergic rhinitis, COVID-19, Dyspepsia, Hypercholesteremia, IBS (irritable bowel syndrome), and Lumbosacral strain who presents with  a sudden onset episode of R arm numbness and weakness, R upper quadrant vision deficit and spinning with a sensation of her feet are not on her. The spinning and RUE weakness improved,  she continued to have R vision deficit on peripheral vision testing. She does endorse that she has had blurred vision for a month but the R upper quadrant vision loss is new and started  at 1700 on 08/05/20. Even thou her NIHSS was low, given that vision loss can be debilitating, I offered her tPA. Discussed risks and benefits of tPA includign about a 30% chance of improvement and 2-3% risk of ICH and about a total of 6% risk of systemic bleeding. Last Known Well: 1700 on 08/05/20 TPA Given: Yes, bolus started at 1735 IR Thrombectomy: No LVO mRS: Modified Rankin Scale: 0-Completely asymptomatic and back to baseline post- stroke Time of teleneurologist evaluation: 1959  HOSPITAL COURSE [name] is a 49 y.o. female with history of Allergic rhinitis, COVID-19, Dyspepsia, Hypercholesteremia, IBS (irritable bowel syndrome), and Lumbosacral strain with multiple episodes of care presenting with transient R arm numbness and weakness and a spinning sensation, along with RUQ vision deficit which continued. Received tPA 08/05/2020 at 2035.  Likely complicated migraine, less likely TIA Stroke-like symptoms s/p tPA    Code Stroke CT head No acute abnormality. ASPECTS 10.     CTA head & neck Unremarkable   CT head x 2 for HA no acute abnormality.  MRI  Unremarkable   CT head - normal  2D Echo EF 55-60%. No source of embolus   LDL 117  HgbA1c 4.8  VTE prophylaxis - SCDs   No antithrombotic prior to admission, now on ASA 81. Continue on discharge.   Therapy recommendations:  No therapy needs  Disposition:  Return home  Hx of COVID  COVID infection March 2020. Myalgias, SOB and palpitations following.   Suspected reinfection Jan 2021.  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 Followed by Pulmonology  DISCHARGE EXAM Vitals:   08/06/20 2300  08/07/20 0310 08/07/20 0825 08/07/20 1208  BP: 92/65 102/62 110/85 122/87  Pulse: 76 80 68 75  Resp: 18 19 14 16   Temp: 98 F (36.7 C) 97.9 F (36.6 C) 98.4 F (36.9 C) 98.4 F (36.9 C)  TempSrc: Oral Oral Oral Oral  SpO2: 97% 100% 100% 100%  Weight:      Height:       General - Well nourished, well developed, in no apparent distress.  Ophthalmologic - fundi not visualized due to noncooperation.  Cardiovascular - Regular rhythm and rate.  Mental Status -  Level of arousal and orientation to time, place, and person were intact. Language including expression, naming, repetition, comprehension was assessed and found intact. Fund of Knowledge was assessed and was intact.  Cranial Nerves II - XII - II - Visual field intact OU. III, IV, VI - Extraocular movements intact. V - Facial sensation intact bilaterally. VII - Facial movement intact bilaterally. VIII - Hearing & vestibular intact bilaterally. X - Palate elevates symmetrically. XI - Chin turning & shoulder shrug intact bilaterally. XII - Tongue protrusion intact.  Motor Strength - The patient's strength was normal in all extremities and pronator drift was absent. Bulk was normal and fasciculations were absent.   Motor Tone - Muscle tone was assessed at the neck and appendages and  was normal.  Reflexes - The patient's reflexes were symmetrical in all extremities and she had no pathological reflexes.  Sensory - Light touch, temperature/pinprick were assessed and were symmetrical    Coordination - The patient had normal movements in the hands with no ataxia or dysmetria.  Tremor was absent.  Gait and Station - deferred.   DISCHARGE DIET   Diet Order            Diet regular Room service appropriate? Yes; Fluid consistency: Thin  Diet effective now                liquids  DISCHARGE PLAN  Disposition: Discharge to home - no f/u therapy recommended  aspirin 81 mg daily for secondary stroke  prevention.  Ongoing risk factor control by Primary Care Physician at time of discharge  Follow-up Merri Brunette, MD in 2 weeks.  Follow-up in [facility] in 4 weeks, office to schedule an appointment.   40 minutes were spent preparing discharge.  Marvel Plan, MD PhD Stroke Neurology 08/07/2020 11:31 PM

## 2020-08-07 NOTE — Plan of Care (Signed)

## 2020-08-07 NOTE — Progress Notes (Signed)
Patient discharged out via wheelchair; accompanied home by her daughter and son.

## 2020-08-07 NOTE — Evaluation (Addendum)
Occupational Therapy Evaluation/ DISCHARGE Patient Details Name: Natalie Ford MRN: [id number] DOB: 07/28/1971 Today's Date: 08/07/2020    History of Present Illness 49 yo female s/p TPA with symptoms of R arm numbness and weakness including R UQ vision deficits. MRI and CT noted as normal PMH post COVID syndrome, allergic rhinitis, IBS lumbosacral strain   Clinical Impression   Patient evaluated by Occupational Therapy with no further acute OT needs identified. All education has been completed and the patient has no further questions. See below for any follow-up Occupational Therapy or equipment needs. OT to sign off. Thank you for referral.      Follow Up Recommendations  No OT follow up    Equipment Recommendations  None recommended by OT    Recommendations for Other Services       Precautions / Restrictions Precautions Precautions: None Restrictions Weight Bearing Restrictions: No      Mobility Bed Mobility Overal bed mobility: Independent             General bed mobility comments: in chair on arrival    Transfers Overall transfer level: Independent Equipment used: None             General transfer comment: no difficulty    Balance Overall balance assessment: Independent                               Standardized Balance Assessment Standardized Balance Assessment : Dynamic Gait Index;Berg Balance Test Berg Balance Test Sit to Stand: Able to stand without using hands and stabilize independently Standing Unsupported: Able to stand safely 2 minutes Sitting with Back Unsupported but Feet Supported on Floor or Stool: Able to sit safely and securely 2 minutes Stand to Sit: Sits safely with minimal use of hands Transfers: Able to transfer safely, minor use of hands Standing Unsupported with Eyes Closed: Able to stand 10 seconds safely Standing Ubsupported with Feet Together: Able to place feet together independently and stand 1 minute  safely From Standing, Reach Forward with Outstretched Arm: Can reach confidently >25 cm (10") From Standing Position, Pick up Object from Floor: Able to pick up shoe safely and easily From Standing Position, Turn to Look Behind Over each Shoulder: Looks behind from both sides and weight shifts well Turn 360 Degrees: Able to turn 360 degrees safely in 4 seconds or less Standing Unsupported, Alternately Place Feet on Step/Stool: Able to stand independently and safely and complete 8 steps in 20 seconds Standing Unsupported, One Foot in Front: Able to place foot tandem independently and hold 30 seconds Standing on One Leg: Able to lift leg independently and hold > 10 seconds Total Score: 56 Dynamic Gait Index Level Surface: Normal Change in Gait Speed: Normal Gait with Horizontal Head Turns: Normal Gait with Vertical Head Turns: Normal Gait and Pivot Turn: Normal Step Over Obstacle: Normal Step Around Obstacles: Normal Steps: Normal Total Score: 24     ADL either performed or assessed with clinical judgement   ADL Overall ADL's : Independent                                             Vision Baseline Vision/History: Wears glasses Wears Glasses: Reading only Vision Assessment?: Yes Eye Alignment: Within Functional Limits Ocular Range of Motion: Within Functional Limits Alignment/Gaze Preference: Within Defined Limits Tracking/Visual Pursuits:  Able to track stimulus in all quads without difficulty Additional Comments: pt reports that peripheral vision has improved     Perception     Praxis      Pertinent Vitals/Pain Pain Assessment: No/denies pain Pain Location: generalized discomfort due to her RA and being in the bed so long Pain Descriptors / Indicators: Discomfort (stiff) Pain Intervention(s): Monitored during session (reports feeling stiff from sitting due to RA)     Hand Dominance Right   Extremity/Trunk Assessment Upper Extremity  Assessment Upper Extremity Assessment: Overall WFL for tasks assessed   Lower Extremity Assessment Lower Extremity Assessment: Overall WFL for tasks assessed   Cervical / Trunk Assessment Cervical / Trunk Assessment: Normal   Communication Communication Communication: No difficulties   Cognition Arousal/Alertness: Awake/alert Behavior During Therapy: WFL for tasks assessed/performed Overall Cognitive Status: Within Functional Limits for tasks assessed                                     General Comments  spoke with RN and cleared for Indep movement in the room    Exercises     Shoulder Instructions      Home Living Family/patient expects to be discharged to:: Private residence Living Arrangements: Spouse/significant other Available Help at Discharge: Family;Available 24 hours/day (husband, 2 children 23yo and 93 yo) Type of Home: House Home Access: Stairs to enter Secretary/administrator of Steps: 3 Entrance Stairs-Rails: None Home Layout: Two level Alternate Level Stairs-Number of Steps: flight Alternate Level Stairs-Rails: Left Bathroom Shower/Tub: Walk-in shower (large, just re-done)   Firefighter: Standard     Home Equipment: Information systems manager          Prior Functioning/Environment Level of Independence: Independent        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        OT Problem List:        OT Treatment/Interventions:      OT Goals(Current goals can be found in the care plan section) Acute Rehab OT Goals Patient Stated Goal: go home  OT Frequency:     Barriers to D/C:            Co-evaluation              AM-PAC OT "6 Clicks" Daily Activity     Outcome Measure Help from another person eating meals?: None Help from another person taking care of personal grooming?: None Help from another person toileting, which includes using toliet, bedpan, or urinal?: None Help from another person bathing (including washing, rinsing,  drying)?: None Help from another person to put on and taking off regular upper body clothing?: None Help from another person to put on and taking off regular lower body clothing?: None 6 Click Score: 24   End of Session Nurse Communication: Mobility status  Activity Tolerance: Patient tolerated treatment well Patient left: in chair;with call bell/phone within reach;with nursing/sitter in room                   Time: 1202-1215 OT Time Calculation (min): 13 min Charges:  OT General Charges $OT Visit: 1 Visit OT Evaluation $OT Eval Low Complexity: 1 Low   Brynn, OTR/L  Acute Rehabilitation Services Pager: [phone] Office: [phone] .   Mateo Flow 08/07/2020, 1:14 PM

## 2020-08-08 ENCOUNTER — Encounter: Payer: Self-pay | Admitting: *Deleted

## 2020-08-08 ENCOUNTER — Other Ambulatory Visit: Payer: Self-pay | Admitting: *Deleted

## 2020-08-08 MED FILL — ADVAIR HFA 230-21 MCG INH: 230-21 | 30 days supply | Qty: 12 | Fill #2

## 2020-08-08 NOTE — Patient Outreach (Signed)
[facility]) Care Management  08/08/2020  Natalie Ford 08/06/1971 [id number]   Transition of care call/case closure   Referral received:08/05/20 Initial outreach:08/08/20 Insurance: [place] [employer]   Subjective: Initial successful telephone call to patient's preferred number in order to complete transition of care assessment; 2 HIPAA identifiers verified. Explained purpose of call and completed transition of care assessment.  Leone states that she is doing better, she denies any new concerns for weakness or visual disturbances.  She is  tolerating diet, denies bowel or bladder problems. She denies having headache, Reviewed signs of Stroke with [facility] to seek medication attention for .  Spouse/children are assisting with her  recovery. She discussed plans to follow up with her   Reviewed accessing the following [place] Benefits :  She does  not need a referral to one of the [place] chronic disease management programs.  She is unsure is she has  the hospital indemnity plan provided contact number to Natalie Ford [phone] She uses  a [facility] outpatient pharmacy, at [facility] . Patient denies additional educational or resource information .    Objective:  Natalie Ford  was hospitalized at [facility] 12/12-12/14 for Stroke like syndrome , right arm numbness weakness, right upper quadrant vision deficit.   treated with tPA, probable complicated migraine   Comorbidities include:Hx of Covid 19, IBS, Lumbosacral strain, Hyperlipidemia , Rheumatoid arthritis   She was discharged to home on 08/07/20 1without the need for home health services or DME.     Assessment:  Patient voices good understanding of all discharge instructions.  See transition of care flowsheet for assessment details.   Plan:  Reviewed hospital discharge diagnosis of stroke like syndrome, treated with tPA probable complicated migraine   and discharge treatment plan using  hospital discharge instructions, assessing medication adherence, reviewing problems requiring provider notification, and discussing the importance of follow up with surgeon, primary care provider and/or specialists as directed.  Reviewed  healthy lifestyle program information to receive discounted premium for  2022   Step 1: Get  your annual physical  Step 2: Complete your health assessment  Step 3:Identify your current health status and complete the corresponding action step between January 1, and April 25, 2020.       No ongoing care management needs identified so will close case to [facility] Care Management services and route successful outreach letter with [facility] Care Management pamphlet and 24 Hour Nurse Line Magnet to [employer] Care Management clinical pool to be mailed to patient's home address.  Thanked patient for their services to [facility].  Egbert Garibaldi, RN, BSN  [facility] Care Management,Care Management Coordinator  [phone]- Mobile [phone]- Toll Free Main Office

## 2020-08-13 ENCOUNTER — Other Ambulatory Visit: Payer: Self-pay | Admitting: *Deleted

## 2020-08-13 NOTE — Patient Outreach (Signed)
[facility]) Care Management  08/13/2020  Natalie Ford [birth year] [id number]   Red on EMMI Alert  Date:08/11/20 Day:3 Reason :  Who reached Patient  Questions/problems with meds? Yes    Outreach Attempt #1 Subjective: Successful outreach call to patient, explained reason for the call , EMMI automated discharge calls.  Addressed EMMI red flag, Questions problems with medications?. Patient discussed conversation with MD during hospital stay regarding being started on statin, she reports declined at that time, she now states that after reading and researching she understands why important after having a stroke and is willing to try medication. She discussed being hesitant due to current rheumatoid condition. Discussed with patient importance of follow up with PCP after discharge for review of medication and hospital visit. She is agreeable to scheduling appointment.  Patient reports being okay on today, she has returned to work , a few hours, reports being really tired after getting dressed and routine of getting to work.  Patient denies any new stroke symptoms on review that effected right side, she reports having discomfort , heaviness to left side that she attributes to rheumatoid arthritis. She reports Humira injection due this week, she plans to notify Rheumatology office prior to taking to inform of recent stroke.    Discussed with patient EMMI discharge calls 5 calls over 3 weeks and depending on response it may trigger follow up call, she voiced understanding.   Plan Patient agreeable to follow up call in the next week regarding scheduling PCP visit post discharge EMMI call follow up. Patient has been sent [facility] successful outreach letter in the last week.    Egbert Garibaldi, RN, BSN  [facility] Care Management,Care Management Coordinator  [phone]- Mobile [phone]- Toll Free Main Office

## 2020-08-22 ENCOUNTER — Other Ambulatory Visit: Payer: Self-pay | Admitting: *Deleted

## 2020-08-22 NOTE — Patient Outreach (Signed)
[facility]) Care Management  08/22/2020  Natalie Ford [birth year] [id number]   Red on EMMI Alert Follow up call  Date:08/11/20 Day:3 Reason :  Who reached Patient  Questions/problems with meds? Yes   Red on EMMI Alert  Date:08/21/20 Day:13 Reason :  Micah Flesher to Appointment ? No    Subjective: Outreach call to patient, she reports that she is feeling about her usual. Explained reason for the call and EMMI automated call responses, patient voiced understanding.  Addressed reason for alert for 12/18 regarding questions regarding medications, she had visit with PCP and able to discuss starting cholesterol medication and she has been prescribed Crestor.  Addressed Red Flag Alert from 12/28, Went to Appointment ? NO ,  patient discussed that she has scheduled appointment with neurology  and visit scheduled for February.She states she will follow up sooner if new concerns, denies due concerns for stroke symptoms voiced understanding of warning symptoms to seek medical attention for.   Patient denies any further concerns at this time, she has returned to limited work schedule.   Plan Will close case to [facility] care management , EMMI response reasons addressed.    Egbert Garibaldi, RN, BSN  [facility] Care Management,Care Management Coordinator  [phone]- Mobile [phone]- Toll Free Main Office

## 2020-08-28 DIAGNOSIS — J45909 Unspecified asthma, uncomplicated: Secondary | ICD-10-CM | POA: Diagnosis not present

## 2020-08-28 DIAGNOSIS — M8580 Other specified disorders of bone density and structure, unspecified site: Secondary | ICD-10-CM | POA: Diagnosis not present

## 2020-08-28 DIAGNOSIS — M2550 Pain in unspecified joint: Secondary | ICD-10-CM | POA: Diagnosis not present

## 2020-08-28 DIAGNOSIS — M25539 Pain in unspecified wrist: Secondary | ICD-10-CM | POA: Diagnosis not present

## 2020-08-28 DIAGNOSIS — U099 Post covid-19 condition, unspecified: Secondary | ICD-10-CM | POA: Diagnosis not present

## 2020-08-28 DIAGNOSIS — K589 Irritable bowel syndrome without diarrhea: Secondary | ICD-10-CM | POA: Diagnosis not present

## 2020-08-28 DIAGNOSIS — M0609 Rheumatoid arthritis without rheumatoid factor, multiple sites: Secondary | ICD-10-CM | POA: Diagnosis not present

## 2020-08-28 DIAGNOSIS — Z79899 Other long term (current) drug therapy: Secondary | ICD-10-CM | POA: Diagnosis not present

## 2020-08-28 DIAGNOSIS — Z13820 Encounter for screening for osteoporosis: Secondary | ICD-10-CM | POA: Diagnosis not present

## 2020-08-28 DIAGNOSIS — R5383 Other fatigue: Secondary | ICD-10-CM | POA: Diagnosis not present

## 2020-08-28 DIAGNOSIS — M791 Myalgia, unspecified site: Secondary | ICD-10-CM | POA: Diagnosis not present

## 2020-09-11 ENCOUNTER — Other Ambulatory Visit: Payer: Self-pay

## 2020-09-11 ENCOUNTER — Ambulatory Visit (HOSPITAL_BASED_OUTPATIENT_CLINIC_OR_DEPARTMENT_OTHER): Payer: No Typology Code available for payment source | Admitting: Pharmacist

## 2020-09-11 ENCOUNTER — Other Ambulatory Visit (HOSPITAL_COMMUNITY): Payer: Self-pay | Admitting: Internal Medicine

## 2020-09-11 ENCOUNTER — Encounter: Payer: Self-pay | Admitting: Pharmacist

## 2020-09-11 DIAGNOSIS — Z79899 Other long term (current) drug therapy: Secondary | ICD-10-CM

## 2020-09-11 MED ORDER — ENBREL MINI 50 MG/ML ~~LOC~~ SOCT: SUBCUTANEOUS | 4 refills | Status: DC

## 2020-09-11 NOTE — Progress Notes (Signed)
   S: Patient presents for review of their specialty medication therapy.  Patient is currently taking Enbrel for RA. Patient is managed by Dr. Renne Crigler for this.   Adherence: has not yet started  Efficacy: has not started. Was on Humira but this was ineffective.   Dosing: Ankylosing spondylitis, psoriatic arthritis, rheumatoid arthritis: SubQ: Note: May continue methotrexate, glucocorticoids, salicylates, NSAIDs, or analgesics during etanercept therapy. Once-weekly dosing: 50 mg once weekly; maximum dose (rheumatoid arthritis): 50 mg/week. Twice-weekly dosing (off-label dose): 25 mg twice weekly (Bathon 2000; Calin 2004; Davis 2003; Genovese 2002; Mease 2000; Mease 2002)  Screening: TB test: completed per pt Hepatitis B: completed per pt   Monitoring: Injection site reactions: has not yet started; counseling given S/sx of infections: has not yet started; counseling given S/sx of malignancy: has not yet started; counseling given GI upset: has not yet started; counseling given    O:  Lab Results  Component Value Date   WBC 9.9 08/05/2020   HGB 13.7 08/05/2020   HCT 40.0 08/05/2020   MCV 98.3 08/05/2020   PLT 291 08/05/2020      Chemistry      Component Value Date/Time   NA 136 08/05/2020 2000   K 3.7 08/05/2020 2000   CL 102 08/05/2020 2000   CO2 24 08/05/2020 2000   BUN 10 08/05/2020 2000   CREATININE 0.87 08/05/2020 2000   CREATININE 0.87 11/15/2019 1546      Component Value Date/Time   CALCIUM 9.0 08/05/2020 2000   ALKPHOS 33 (L) 08/05/2020 2000   AST 21 08/05/2020 2000   ALT 19 08/05/2020 2000   BILITOT 0.7 08/05/2020 2000       A/P: 1. Medication review: patient currently prescribed Enbrel for RA.She was previously on Humira. Reviewed the medication with the patient, including the following: Enbrel (etanercept) binds tumor necrosis factor (TNF) and blocks its interaction with cell surface receptors. TNF plays an important role in the inflammatory processes  of many diseases. Patient educated on purpose, proper use and potential adverse effects of Enbrel. SubQ: Administer subcutaneously. Rotate injection sites; may inject into the thigh (preferred), abdomen (avoiding the 2-inch area around the navel), or outer areas of upper arm. New injections should be given at least 1 inch from an old site and never into areas where the skin is tender, bruised, red, or hard; any raised thick, red, or scaly skin patches or lesions; or areas with scars or stretch marks. For a more comfortable injection, allow autoinjectors, prefilled syringes, and dose trays to reach room temperature for 15 to 30 minutes (?30 minutes for autoinjector) prior to injection; do not remove the needle cover while allowing product to reach room temperature. There may be small white particles of protein in the solution; this is not unusual for proteinaceous solutions. Possible adverse effects include rash, GI upset, increased risk of infection, and injection site reactions. Patients should stop Enbrel if they develop a serious infection. There is a possible increased risk in lymphoma and other malignancies. No recommendations for any changes.    Butch Penny, PharmD, P[place]y [place], CPP Clinical Pharmacist [facility] [phone]

## 2020-09-14 ENCOUNTER — Other Ambulatory Visit (HOSPITAL_COMMUNITY): Payer: Self-pay | Admitting: Surgery

## 2020-09-14 DIAGNOSIS — H04123 Dry eye syndrome of bilateral lacrimal glands: Secondary | ICD-10-CM | POA: Diagnosis not present

## 2020-09-17 DIAGNOSIS — Z79899 Other long term (current) drug therapy: Secondary | ICD-10-CM | POA: Diagnosis not present

## 2020-09-18 ENCOUNTER — Other Ambulatory Visit (HOSPITAL_COMMUNITY): Payer: Self-pay | Admitting: Surgery

## 2020-09-19 DIAGNOSIS — F339 Major depressive disorder, recurrent, unspecified: Secondary | ICD-10-CM | POA: Diagnosis not present

## 2020-09-19 DIAGNOSIS — L658 Other specified nonscarring hair loss: Secondary | ICD-10-CM | POA: Diagnosis not present

## 2020-09-19 DIAGNOSIS — K9289 Other specified diseases of the digestive system: Secondary | ICD-10-CM | POA: Diagnosis not present

## 2020-09-19 DIAGNOSIS — G4719 Other hypersomnia: Secondary | ICD-10-CM | POA: Diagnosis not present

## 2020-09-19 MED FILL — EYSUVIS 0.25 % SUSP: 0.25 | 14 days supply | Qty: 8 | Fill #0

## 2020-10-03 ENCOUNTER — Encounter: Payer: Self-pay | Admitting: Neurology

## 2020-10-03 ENCOUNTER — Ambulatory Visit: Payer: 59 | Admitting: Neurology

## 2020-10-03 VITALS — BP 138/88 | HR 89 | Ht 59.5 in | Wt 117.4 lb

## 2020-10-03 DIAGNOSIS — R200 Anesthesia of skin: Secondary | ICD-10-CM

## 2020-10-03 DIAGNOSIS — R2990 Unspecified symptoms and signs involving the nervous system: Secondary | ICD-10-CM

## 2020-10-03 NOTE — Progress Notes (Signed)
[facility] [address] [place]. [place] [address] [phone]       OFFICE CONSULT NOTE  [name] Date of Birth:  May 17, 1971 Medical Record Number:  [id number]   Referring MD: Natalie Champagne, [name] Reason for Referral: Stroke like episode HPI: [name] is a 50 year old Caucasian lady seen today for initial office consultation visit for strokelike episode.  History is obtained from the patient, review of electronic medical records and I personally reviewed available imaging films in PACS.  She has past medical history of hyperlipidemia, dyspepsia, irritable bowel syndrome, COVID-19 infection who presented on 08/05/2020 with sudden onset of right-sided weakness numbness right upper quadrant vision difficulties and spinning sensation.  His symptoms initially improved but she continued to have focal deficits since she was given IV TPA after discussion of risk benefit.  Her symptoms resolved after that.  She was kept in the neurological intensive care unit and blood pressure tightly controlled.  Subsequently in the CT angiogram and head and neck were obtained prior to TPA which were unremarkable.  Subsequently MRI scan of the brain was obtained which was normal.  2D echo showed ejection fraction of 55 to 60% without cardiac source of embolism.  LDL cholesterol was elevated at 1 1 7  mg percent and hemoglobin A1c was 4.8.  She is started on aspirin and diet controlled for lipids.  Patient states that she has done well since discharge she has had no recurrent episodes.  She has had a lot of muscle aches and pains and has been diagnosed with rheumatoid arthritis and is planning on starting Enbrel soon.  She continues to have some blurred vision and has seen an ophthalmologist was prescribed Eysuvis eyedrops which she is using.  She also feels easy fatigability and feels a lot of brain fog.  She has no significant vascular risk factors except hyperlipidemia.  She is  tolerating aspirin well without bruising or bleeding.  She has no new complaints.  She denies any prior history of migraine headaches similar episodes in the past.  ROS:   14 system review of systems is positive for numbness, weakness, blurred vision, room spinning, muscle aches and pains and all other systems negative  PMH:  Past Medical History:  Diagnosis Date  . Allergic rhinitis   . COVID-19   . Dyspepsia   . Hypercholesteremia   . IBS (irritable bowel syndrome)   . Lumbosacral strain   . Stroke [facility]) 08/05/2020   tia    Social History:  Social History   Socioeconomic History  . Marital status: Divorced    Spouse name: Not on file  . Number of children: 2  . Years of education: Not on file  . Highest education level: Not on file  Occupational History  . 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 Occupation: Psychologist, forensic, Charity fundraiser    Comment: [employer]  Tobacco Use  . Smoking status: Never Smoker  . Smokeless tobacco: Never Used  . Tobacco comment: exposed to second hand smoke  Vaping Use  . Vaping Use: Never used  Substance and Sexual Activity  . Alcohol use: Yes    Comment: social use  . Drug use: No  . Sexual activity: Not on file  Other Topics Concern  . Not on file  Social History Narrative   Lives with 2 children and husband   Right Handed   Drinks 1-2 cups caffeine daily   Social Determinants of Health  Financial Resource Strain: Not on file  Food Insecurity: Not on file  Transportation Needs: Not on file  Physical Activity: Not on file  Stress: Not on file  Social Connections: Not on file  Intimate Partner Violence: Not on file    Medications:   Current Outpatient Medications on File Prior to Visit  Medication Sig Dispense Refill  . albuterol (VENTOLIN HFA) 108 (90 Base) MCG/ACT inhaler Inhale 1-2 puffs into the lungs every 6 (six) hours as needed for wheezing or shortness of breath. 18 g 5  .  amphetamine-dextroamphetamine (ADDERALL) 20 MG tablet Take 10 mg by mouth in the morning, at noon, and at bedtime.    [name] aspirin EC 81 MG EC tablet Take 1 tablet (81 mg total) by mouth daily. Swallow whole. 30 tablet 11  . buPROPion (WELLBUTRIN XL) 150 MG 24 hr tablet Take 450 mg by mouth daily.    . cetirizine (ZYRTEC) 10 MG tablet Take 10 mg by mouth daily as needed for allergies.    . Etanercept (ENBREL MINI) 50 MG/ML SOCT Inject 50 mg under the skin once weekly as directed. 4 mL 4  . fluticasone (FLONASE) 50 MCG/ACT nasal spray Place 2 sprays into both nostrils daily.    . fluticasone-salmeterol (ADVAIR HFA) 230-21 MCG/ACT inhaler Inhale 2 puffs into the lungs 2 (two) times daily. 1 Inhaler 12  . gabapentin (NEURONTIN) 300 MG capsule Take 300 mg by mouth 3 (three) times daily as needed.    [name] ibuprofen (ADVIL,MOTRIN) 200 MG tablet Take 200 mg by mouth every 6 (six) hours as needed for headache or moderate pain.    . modafinil (PROVIGIL) 200 MG tablet Take 200 mg by mouth daily.    . Multiple Vitamin (MULTIVITAMIN) capsule Take 1 capsule by mouth at bedtime.    . Omega-3 Fatty Acids (FISH OIL) 1000 MG CAPS Take 2 capsules by mouth daily.    Bertram Gala Glycol-Propyl Glycol (SYSTANE HYDRATION PF OP) Place 1 drop into both eyes every 3 (three) hours as needed (dry eyes).    . prednisoLONE 5 MG TABS tablet Take 10 mg by mouth daily.    . propranolol (INDERAL) 20 MG tablet Take 20 mg by mouth daily.    . rosuvastatin (CRESTOR) 10 MG tablet Take 10 mg by mouth daily.    [name] spironolactone (ALDACTONE) 50 MG tablet Take 75 mg by mouth daily.     No current facility-administered medications on file prior to visit.    Allergies:   Allergies  Allergen Reactions  . Penicillins     rash    Physical Exam General: Pleasant well developed, well nourished middle-aged Caucasian lady, seated, in no evident distress Head: head normocephalic and atraumatic.   Neck: supple with no carotid or supraclavicular  bruits Cardiovascular: regular rate and rhythm, no murmurs Musculoskeletal: no deformity Skin:  no rash/petichiae Vascular:  Normal pulses all extremities  Neurologic Exam Mental Status: Awake and fully alert. Oriented to place and time. Recent and remote memory intact. Attention span, concentration and fund of knowledge appropriate. Mood and affect appropriate.  Cranial Nerves: Fundoscopic exam reveals sharp disc margins. Pupils equal, briskly reactive to light. Extraocular movements full without nystagmus. Visual fields full to confrontation. Hearing intact. Facial sensation intact. Face, tongue, palate moves normally and symmetrically.  Motor: Normal bulk and tone. Normal strength in all tested extremity muscles. Sensory.: intact to touch , pinprick , position and vibratory sensation.  Coordination: Rapid alternating movements normal in all extremities. Finger-to-nose and heel-to-shin performed  accurately bilaterally. Gait and Station: Arises from chair without difficulty. Stance is normal. Gait demonstrates normal stride length and balance . Able to heel, toe and tandem walk without difficulty.  Reflexes: 1+ and symmetric. Toes downgoing.   NIHSS  0 Modified Rankin 0   ASSESSMENT: 50 year old Caucasian lady with transient episode of right-sided weakness numbness, spinning sensation and vision deficits nightly stroke like episode possibly complicated migraine treated with IV TPA with full recovery.  Brain imaging negative for acute stroke.  Vascular risk factors of hyperlipidemia only.     PLAN: I had a long d/w patient about recent strokelike episode, risk for recurrent stroke/TIAs, personally independently reviewed imaging studies and stroke evaluation results and answered questions.Continue aspirin 81 mg daily  for secondary stroke prevention and maintain strict control of hypertension with blood pressure goal below 130/90, diabetes with hemoglobin A1c goal below 6.5% and lipids with  LDL cholesterol goal below 70 mg/dL. I also advised the patient to eat a healthy diet with plenty of whole grains, cereals, fruits and vegetables, exercise regularly and maintain ideal body weight Followup in the future with me in 6 months or call earlier if necessary.  Greater than 50% time during this 45-minute consultation visit was spent on counseling and coordination of care about TIA and complicated migraine and strokelike episode discussion and answering questions. Delia Heady, MD  [facility] [address] [place], [place] [address]  Phone [phone] Fax [phone] Note: This document was prepared with digital dictation and possible smart phrase technology. Any transcriptional errors that result from this process are unintentional.

## 2020-10-03 NOTE — Patient Instructions (Signed)
I had a long d/w patient about recent strokelike episode, risk for recurrent stroke/TIAs, personally independently reviewed imaging studies and stroke evaluation results and answered questions.Continue aspirin 81 mg daily  for secondary stroke prevention and maintain strict control of hypertension with blood pressure goal below 130/90, diabetes with hemoglobin A1c goal below 6.5% and lipids with LDL cholesterol goal below 70 mg/dL. I also advised the patient to eat a healthy diet with plenty of whole grains, cereals, fruits and vegetables, exercise regularly and maintain ideal body weight Followup in the future with me in 6 months or call earlier if necessary.  Stroke Prevention Some medical conditions and behaviors are associated with a higher chance of having a stroke. You can help prevent a stroke by making nutrition, lifestyle, and other changes, including managing any medical conditions you may have. What nutrition changes can be made?  Eat healthy foods. You can do this by: ? Choosing foods high in fiber, such as fresh fruits and vegetables and whole grains. ? Eating at least 5 or more servings of fruits and vegetables a day. Try to fill half of your plate at each meal with fruits and vegetables. ? Choosing lean protein foods, such as lean cuts of meat, poultry without skin, fish, tofu, beans, and nuts. ? Eating low-fat dairy products. ? Avoiding foods that are high in salt (sodium). This can help lower blood pressure. ? Avoiding foods that have saturated fat, trans fat, and cholesterol. This can help prevent high cholesterol. ? Avoiding processed and premade foods.  Follow your health care provider's specific guidelines for losing weight, controlling high blood pressure (hypertension), lowering high cholesterol, and managing diabetes. These may include: ? Reducing your daily calorie intake. ? Limiting your daily sodium intake to 1,500 milligrams (mg). ? Using only healthy fats for cooking,  such as olive oil, canola oil, or sunflower oil. ? Counting your daily carbohydrate intake.   What lifestyle changes can be made?  Maintain a healthy weight. Talk to your health care provider about your ideal weight.  Get at least 30 minutes of moderate physical activity at least 5 days a week. Moderate activity includes brisk walking, biking, and swimming.  Do not use any products that contain nicotine or tobacco, such as cigarettes and e-cigarettes. If you need help quitting, ask your health care provider. It may also be helpful to avoid exposure to secondhand smoke.  Limit alcohol intake to no more than 1 drink a day for nonpregnant women and 2 drinks a day for men. One drink equals 12 oz of beer, 5 oz of wine, or 1 oz of hard liquor.  Stop any illegal drug use.  Avoid taking birth control pills. Talk to your health care provider about the risks of taking birth control pills if: ? You are over 35 years old. ? You smoke. ? You get migraines. ? You have ever had a blood clot. What other changes can be made?  Manage your cholesterol levels. ? Eating a healthy diet is important for preventing high cholesterol. If cholesterol cannot be managed through diet alone, you may also need to take medicines. ? Take any prescribed medicines to control your cholesterol as told by your health care provider.  Manage your diabetes. ? Eating a healthy diet and exercising regularly are important parts of managing your blood sugar. If your blood sugar cannot be managed through diet and exercise, you may need to take medicines. ? Take any prescribed medicines to control your diabetes as told by  your health care provider.  Control your hypertension. ? To reduce your risk of stroke, try to keep your blood pressure below 130/80. ? Eating a healthy diet and exercising regularly are an important part of controlling your blood pressure. If your blood pressure cannot be managed through diet and exercise, you  may need to take medicines. ? Take any prescribed medicines to control hypertension as told by your health care provider. ? Ask your health care provider if you should monitor your blood pressure at home. ? Have your blood pressure checked every year, even if your blood pressure is normal. Blood pressure increases with age and some medical conditions.  Get evaluated for sleep disorders (sleep apnea). Talk to your health care provider about getting a sleep evaluation if you snore a lot or have excessive sleepiness.  Take over-the-counter and prescription medicines only as told by your health care provider. Aspirin or blood thinners (antiplatelets or anticoagulants) may be recommended to reduce your risk of forming blood clots that can lead to stroke.  Make sure that any other medical conditions you have, such as atrial fibrillation or atherosclerosis, are managed. What are the warning signs of a stroke? The warning signs of a stroke can be easily remembered as BEFAST.  B is for balance. Signs include: ? Dizziness. ? Loss of balance or coordination. ? Sudden trouble walking.  E is for eyes. Signs include: ? A sudden change in vision. ? Trouble seeing.  F is for face. Signs include: ? Sudden weakness or numbness of the face. ? The face or eyelid drooping to one side.  A is for arms. Signs include: ? Sudden weakness or numbness of the arm, usually on one side of the body.  S is for speech. Signs include: ? Trouble speaking (aphasia). ? Trouble understanding.  T is for time. ? These symptoms may represent a serious problem that is an emergency. Do not wait to see if the symptoms will go away. Get medical help right away. Call your local emergency services (911 in the [place] Do not drive yourself to the hospital.  Other signs of stroke may include: ? A sudden, severe headache with no known cause. ? Nausea or vomiting. ? Seizure. Where to find more information For more information,  visit:  American Stroke Association: www.strokeassociation.org  National Stroke Association: www.stroke.org Summary  You can prevent a stroke by eating healthy, exercising, not smoking, limiting alcohol intake, and managing any medical conditions you may have.  Do not use any products that contain nicotine or tobacco, such as cigarettes and e-cigarettes. If you need help quitting, ask your health care provider. It may also be helpful to avoid exposure to secondhand smoke.  Remember BEFAST for warning signs of stroke. Get help right away if you or a loved one has any of these signs. This information is not intended to replace advice given to you by your health care provider. Make sure you discuss any questions you have with your health care provider. Document Revised: 07/24/2017 Document Reviewed: 09/16/2016 Elsevier Patient Education  2021 [employer].

## 2020-10-08 ENCOUNTER — Other Ambulatory Visit (HOSPITAL_COMMUNITY): Payer: Self-pay | Admitting: Internal Medicine

## 2020-10-08 ENCOUNTER — Other Ambulatory Visit: Payer: Self-pay | Admitting: Pharmacist

## 2020-10-08 MED ORDER — ENBREL 50 MG/ML ~~LOC~~ SOSY: PREFILLED_SYRINGE | SUBCUTANEOUS | 1 refills | Status: DC

## 2020-10-08 MED FILL — ENBREL 50 MG/ML SOSY: 50 | 28 days supply | Qty: 4 | Fill #0

## 2020-10-18 ENCOUNTER — Encounter: Payer: Self-pay | Admitting: Neurology

## 2020-10-18 ENCOUNTER — Ambulatory Visit: Payer: 59 | Admitting: Neurology

## 2020-10-18 VITALS — BP 118/78 | HR 79 | Ht 59.5 in | Wt 117.0 lb

## 2020-10-18 DIAGNOSIS — J4520 Mild intermittent asthma, uncomplicated: Secondary | ICD-10-CM

## 2020-10-18 DIAGNOSIS — R5382 Chronic fatigue, unspecified: Secondary | ICD-10-CM | POA: Diagnosis not present

## 2020-10-18 DIAGNOSIS — U099 Post covid-19 condition, unspecified: Secondary | ICD-10-CM | POA: Diagnosis not present

## 2020-10-18 DIAGNOSIS — Z789 Other specified health status: Secondary | ICD-10-CM | POA: Diagnosis not present

## 2020-10-18 DIAGNOSIS — E271 Primary adrenocortical insufficiency: Secondary | ICD-10-CM | POA: Insufficient documentation

## 2020-10-18 DIAGNOSIS — G9332 Myalgic encephalomyelitis/chronic fatigue syndrome: Secondary | ICD-10-CM

## 2020-10-18 DIAGNOSIS — G9331 Postviral fatigue syndrome: Secondary | ICD-10-CM

## 2020-10-18 DIAGNOSIS — G933 Postviral fatigue syndrome: Secondary | ICD-10-CM

## 2020-10-18 DIAGNOSIS — G4719 Other hypersomnia: Secondary | ICD-10-CM | POA: Diagnosis not present

## 2020-10-18 NOTE — Patient Instructions (Signed)
Chronic Fatigue Syndrome Chronic fatigue syndrome (CFS) is a condition that causes extreme tiredness (fatigue). This condition is also known as myalgic encephalomyelitis (ME). The fatigue in CFS does not improve with rest, and it gets worse with physical or mental activity. Several other symptoms may occur along with fatigue. Symptoms may come and go, but they generally last for months. Sometimes, CFS gets better over time. In other cases, it can be a lifelong condition. There is no cure, but there are many possible treatments. You will need to work with your health care providers to find a treatment plan that works best for you. What are the causes? The cause of this condition is not known. CFS may be caused by a combination of things. Possible causes include:  An infection.  An abnormal body defense system (abnormal immune system).  Low blood pressure.  Poor diet.  Living with a lot of physical or emotional stress. What increases the risk? The following factors may make you more likely to develop this condition:  Being female.  Being 9-86 years old.  Having a family history of CFS. What are the signs or symptoms? The main symptom of this condition is fatigue that is severe enough to interfere with day-to-day activities. This fatigue does not get better with rest, and it gets worse with physical or mental activity. There are eight other major symptoms of CFS:  Discomfort and lack of energy (malaise) that lasts more than 24 hours after physical activity.  Sleep that does not relieve fatigue (unrefreshing sleep).  Short-term memory loss or confusion.  Joint pain without redness or swelling.  Muscle aches.  Headaches.  Painful and swollen glands (lymph nodes) in the neck or under the arms.  Sore throat. Other symptoms can include:  Cramps in the abdomen, constipation, or diarrhea (irritable bowel syndrome).  Chills and night sweats.  Vision changes.  Dizziness and  mental confusion (brain fog).  Clumsiness.  Sensitivity to food, noise, or odors.  Mood swings, depression, or anxiety attacks. How is this diagnosed? There are no tests that can diagnose this condition. Your health care provider will make the diagnosis based on:  Your symptoms and medical history.  A physical exam and a mental health exam.  Tests to rule out other conditions. It is important to make sure that your symptoms are not caused by another medical condition. Tests may include lab tests or X-rays. For your health care provider to diagnose CFS:  You must have had fatigue for at least 6 straight months.  Fatigue must be your first symptom, and it must be severe enough to interfere with day-to-day activities.  You must also have at least four of the eight other major symptoms of CFS.  There must be no other cause found for the fatigue. How is this treated? There is no cure for CFS. The condition affects everyone differently. You will need to work with your team of health care providers to find the best treatments for your symptoms. Your team may include your primary care provider, physical and exercise therapists, and mental health therapists. Treatment may include:  Having a regular bedtime routine to help improve your sleep.  Avoiding caffeine, alcohol, and tobacco or nicotine products.  Doing light exercise and stretching during the day. You may also want to try movement exercises, such as yoga or tai chi.  Taking medicines to help you sleep or to relieve joint or muscle pain.  Learning and practicing relaxation techniques, such as deep breathing and  muscle relaxation.  Using memory aids or doing brainteasers to improve memory and concentration.  Getting care for your body and mental well-being, such as: ? Seeing a mental health therapist to evaluate and treat depression, if necessary. ? Cognitive behavioral therapy (CBT). This therapy changes the way you think or  act in response to the fatigue. This may help improve how you feel. ? Trying massage therapy and acupuncture.   Follow these instructions at home: Eating and drinking  Avoid caffeine and alcohol.  Avoid heavy meals in the evening.  Eat a healthy diet that includes foods such as vegetables, fruits, fish, and lean meats.   Activity  Rest as told by your health care provider.  Avoid fatigue by pacing yourself during the day and getting enough sleep at night.  Exercise regularly, as told by your health care provider.  Go to bed and get up at the same time every day. Lifestyle  Ask your health care provider whether you should keep a diary. Your health care provider will tell you what information to write in the diary. This may include when you have fatigue and how medicines and other behaviors or treatments help to reduce the fatigue.  Consider joining a CFS support group.  Avoid stress and use stress-reducing techniques that you learn in therapy. General instructions  Take over-the-counter and prescription medicines only as told by your health care provider.  Do not use herbal or dietary supplements unless they are approved by your health care provider.  Maintain a healthy weight.  Do not use any products that contain nicotine or tobacco, such as cigarettes, e-cigarettes, and chewing tobacco. If you need help quitting, ask your health care provider.  Keep all follow-up visits as told by your health care provider. This is important.   Where to find more information Get more information or find a support group near you at one of these links:  American Myalgic Encephalomyelitis and Chronic Fatigue Syndrome Society: ammes.org  Centers for Disease Control and Prevention: http://www.wolf.info/ Contact a health care provider if:  Your symptoms do not get better or they get worse.  You feel angry, guilty, anxious, or depressed. Get help right away if:  You have thoughts of self-harm. If  you ever feel like you may hurt yourself or others, or have thoughts about taking your own life, get help right away. Go to your nearest emergency department or:  Call your local emergency services (911 in the [place]  Call a suicide crisis helpline, such as the [facility] at 832-169-6686. This is open 24 hours a day in the [place]  Text the Crisis Text Line at 7634573752 (in the [place].). Summary  Chronic fatigue syndrome (CFS) is a condition that causes extreme tiredness (fatigue). This fatigue does not improve with rest, and it gets worse with physical or mental activity.  There is no cure for CFS. The condition affects everyone differently. You will need to work with your team of health care providers to find the best treatments for your symptoms.  Exercise regularly, as told by your health care provider. Avoid stress and use stress-reducing techniques that you learn in therapy.  Contact a health care provider if your symptoms do not get better or they get worse. This information is not intended to replace advice given to you by your health care provider. Make sure you discuss any questions you have with your health care provider. Document Revised: 07/25/2019 Document Reviewed: 07/25/2019 Elsevier Patient Education  2021 [employer] Fatigue If you have fatigue, you feel tired all the time and have a lack of energy or a lack of motivation. Fatigue may make it difficult to start or complete tasks because of exhaustion. In general, occasional or mild fatigue is often a normal response to activity or life. However, long-lasting (chronic) or extreme fatigue may be a symptom of a medical condition. Follow these instructions at home: General instructions  Watch your fatigue for any changes.  Go to bed and get up at the same time every day.  Avoid fatigue by pacing yourself during the day and getting enough sleep at night.  Maintain a healthy weight. Medicines  Take  over-the-counter and prescription medicines only as told by your health care provider.  Take a multivitamin, if told by your health care provider.  Do not use herbal or dietary supplements unless they are approved by your health care provider. Activity  Exercise regularly, as told by your health care provider.  Use or practice techniques to help you relax, such as yoga, tai chi, meditation, or massage therapy.   Eating and drinking  Avoid heavy meals in the evening.  Eat a well-balanced diet, which includes lean proteins, whole grains, plenty of fruits and vegetables, and low-fat dairy products.  Avoid consuming too much caffeine.  Avoid the use of alcohol.  Drink enough fluid to keep your urine pale yellow.   Lifestyle  Change situations that cause you stress. Try to keep your work and personal schedule in balance.  Do not use any products that contain nicotine or tobacco, such as cigarettes and e-cigarettes. If you need help quitting, ask your health care provider.  Do not use drugs. Contact a health care provider if:  Your fatigue does not get better.  You have a fever.  You suddenly lose or gain weight.  You have headaches.  You have trouble falling asleep or sleeping through the night.  You feel angry, guilty, anxious, or sad.  You are unable to have a bowel movement (constipation).  Your skin is dry.  You have swelling in your legs or another part of your body. Get help right away if:  You feel confused.  Your vision is blurry.  You feel faint or you pass out.  You have a severe headache.  You have severe pain in your abdomen, your back, or the area between your waist and hips (pelvis).  You have chest pain, shortness of breath, or an irregular or fast heartbeat.  You are unable to urinate, or you urinate less than normal.  You have abnormal bleeding, such as bleeding from the rectum, vagina, nose, lungs, or nipples.  You vomit blood.  You  have thoughts about hurting yourself or others. If you ever feel like you may hurt yourself or others, or have thoughts about taking your own life, get help right away. You can go to your nearest emergency department or call:  Your local emergency services (911 in the [place]  A suicide crisis helpline, such as the National Suicide Prevention Lifeline at (305) 869-5673. This is open 24 hours a day. Summary  If you have fatigue, you feel tired all the time and have a lack of energy or a lack of motivation.  Fatigue may make it difficult to start or complete tasks because of exhaustion.  Long-lasting (chronic) or extreme fatigue may be a symptom of a medical condition.  Exercise regularly, as told by your health care provider.  Change situations that cause you stress.  Try to keep your work and personal schedule in balance. This information is not intended to replace advice given to you by your health care provider. Make sure you discuss any questions you have with your health care provider. Document Revised: 03/02/2019 Document Reviewed: 05/06/2017 Elsevier Patient Education  2021 [employer].

## 2020-10-18 NOTE — Progress Notes (Signed)
SLEEP MEDICINE CLINIC                 OFFICE CONSULT NOTE     Provider:  Melvyn Novas, MD  Primary Care Physician:  Merri Brunette, MD [address] [place] [address] [place] [address]     Referring Provider: Merri Brunette, Md [address] [place],  [place] [address]          Chief Complaint according to patient   Patient presents with:    . New Patient (Initial Visit)           HISTORY OF PRESENT ILLNESS:  Natalie Ford is a 50 y.o. right handed Caucasian female patient who is seen in CONSULTATION on 10/18/2020 from Dr Renne Crigler. She works as a Marine scientist for [employer].  Chief concern according to patient :"I wake up and I am tired" Prior Functioning: The patient had COVID twice in March 2020 but it was a fairly new disease and again  In December 20 -January 21.        The patient has not been vaccinated for Covid after consultation of infectious disease doctor and her rheumatologist.  She states that prior to her Covid infection at least 5 to the second 1 she did feel well and was able to prep dinner after work she was not nearly as exhausted throughout the day but now she is almost chronically fatigued.  She also was not so long ago diagnosed with rheumatoid arthritis and of course that can be a inflammatory autoimmune fatigue contributing as well.  But some of his fatigue seems to be post viral.  Her sleep has not had a good quality.  She has seen Dr. Pearlean Brownie on the ninth of this month for strokelike episode.   Saara Myrene Buddy Mccollam  has a past medical history of Allergic rhinitis, COVID-19 infection twice, Dyspepsia, Hypercholesteremia, IBS (irritable bowel syndrome), Lumbosacral strain, RA (rheumatoid arthritis) (HCC), and TIA (HCC) (08/05/2020) she was seen on [facility] on [address].     Family medical /sleep history:  mother died in her 28s of CVA,   Social history:  Patient is working as a Charity fundraiser and lives in a household with spouse  and 2 children. 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 Pets are not present. Tobacco use- never .  ETOH use ; none Caffeine intake in form of Coffee( 1-2 coffees) . Regular exercise in form of " I just have no energy'- "and: I was a runner before COVID".     Sleep habits are as follows: The patient's dinner time is between 6-7 PM. The patient goes to bed at 10 PM and continues to sleep for many hours, uninterrupted.    The preferred sleep position is prone , with the support of 1-2 pillows.  Dreams are reportedly rare. [name]  5 AM is the usual rise time. The patient wakes up with an alarm.  She reports not feeling refreshed or restored in AM, with symptoms such as dry mouth, morning headaches, and residual fatigue.  Naps are taken infrequently, rarely-     Review of Systems: Out of a complete 14 system review, the patient complains of only the following symptoms, and all other reviewed systems are negative.:  Night time fevers in feb 21 , pneumonia with covid.   Fatigue, sleepiness , no snoring, unfragmented sleep, no Insomnia  Fell asleep while waiting on the phone for pharmacy refills.  How likely are you to doze in the following situations: 0 = not likely, 1 = slight chance, 2 = moderate chance, 3 = high chance   Sitting and Reading? Watching Television? Sitting inactive in a public place (theater or meeting)? As a passenger in a car for an hour without a break? Lying down in the afternoon when circumstances permit? Sitting and talking to someone? Sitting quietly after lunch without alcohol? In a car, while stopped for a few minutes in traffic?   Total = 16/ 24 points   FSS endorsed at 55/ 63 points.   Social History   Socioeconomic History  . Marital status: Divorced    Spouse name: Not on file  . Number of children: 2  . Years of education: Not on file  . Highest education level: Not on file  Occupational History  . Occupation: Charity fundraiser, Scientist, research (physical sciences): [employer]  . Occupation: Charity fundraiser, Scientist, research (physical sciences)    Comment: [employer]  Tobacco Use  . Smoking status: Never Smoker  . Smokeless tobacco: Never Used  . Tobacco comment: exposed to second hand smoke  Vaping Use  . Vaping Use: Never used  Substance and Sexual Activity  . Alcohol use: Yes    Comment: social use  . Drug use: No  . Sexual activity: Not on file  Other Topics Concern  . Not on file  Social History Narrative   Lives with 2 children and husband   Right Handed   Drinks 1-2 cups caffeine daily   Social Determinants of Health   Financial Resource Strain: Not on file  Food Insecurity: Not on file  Transportation Needs: Not on file  Physical Activity: Not on file  Stress: Not on file  Social Connections: Not on file    Family History  Problem Relation Age of Onset  . Diabetes Mother   . Heart disease Mother   . Stroke Mother   . Diabetes Sister   . Leukemia Father   . Stomach cancer Paternal Grandfather   . Kidney disease Paternal Aunt     Past Medical History:  Diagnosis Date  . Allergic rhinitis   . COVID-19   . Dyspepsia   . Hypercholesteremia   . IBS (irritable bowel syndrome)   . Lumbosacral strain   . RA (rheumatoid arthritis) (HCC)   . Stroke [facility]) 08/05/2020   tia    Past Surgical History:  Procedure Laterality Date  . CARPAL TUNNEL RELEASE Right   . CESAREAN SECTION     x 2     Current Outpatient Medications on File Prior to Visit  Medication Sig Dispense Refill  . albuterol (VENTOLIN HFA) 108 (90 Base) MCG/ACT inhaler Inhale 1-2 puffs into the lungs every 6 (six) hours as needed for wheezing or shortness of breath. 18 g 5  . amphetamine-dextroamphetamine (ADDERALL) 20 MG tablet Take 10 mg by mouth in the morning, at noon, and at bedtime.    [name] aspirin EC 81 MG EC tablet Take 1 tablet (81 mg total) by mouth daily. Swallow whole. 30 tablet 11  . buPROPion (WELLBUTRIN XL) 150 MG 24 hr tablet Take 450 mg by mouth  daily.    [name] etanercept (ENBREL) 50 MG/ML injection 1 ml subcutaneous once a week 30 day(s) 4 mL 1  . fluticasone (FLONASE) 50 MCG/ACT nasal spray Place 2 sprays into both nostrils daily.    . fluticasone-salmeterol (ADVAIR HFA) 230-21 MCG/ACT inhaler Inhale 2 puffs into the lungs 2 (two) times  daily. 1 Inhaler 12  . gabapentin (NEURONTIN) 300 MG capsule Take 300-600 mg by mouth 3 (three) times daily as needed.    [name] ibuprofen (ADVIL,MOTRIN) 200 MG tablet Take 200 mg by mouth every 6 (six) hours as needed for headache or moderate pain.    . Multiple Vitamin (MULTIVITAMIN) capsule Take 1 capsule by mouth at bedtime.    . Omega-3 Fatty Acids (FISH OIL) 1000 MG CAPS Take 2 capsules by mouth daily.    Bertram Gala Glycol-Propyl Glycol (SYSTANE HYDRATION PF OP) Place 1 drop into both eyes every 3 (three) hours as needed (dry eyes).    . predniSONE (DELTASONE) 5 MG tablet Take 10 mg by mouth daily.    . propranolol (INDERAL) 20 MG tablet Take 20 mg by mouth daily.    . rosuvastatin (CRESTOR) 10 MG tablet Take 10 mg by mouth daily.    [name] spironolactone (ALDACTONE) 50 MG tablet Take 75 mg by mouth daily.     No current facility-administered medications on file prior to visit.    Allergies  Allergen Reactions  . Penicillins     rash    Physical exam:  Today's Vitals   10/18/20 0943  BP: 118/78  Pulse: 79  Weight: 117 lb (53.1 kg)  Height: 4' 11.5" (1.511 m)   Body mass index is 23.24 kg/m.   Wt Readings from Last 3 Encounters:  10/18/20 117 lb (53.1 kg)  10/03/20 117 lb 6.4 oz (53.3 kg)  08/05/20 113 lb 15.7 oz (51.7 kg)     Ht Readings from Last 3 Encounters:  10/18/20 4' 11.5" (1.511 m)  10/03/20 4' 11.5" (1.511 m)  08/05/20 5' (1.524 m)      General: The patient is awake, alert and appears not in acute distress. The patient is well groomed. Head: Normocephalic, atraumatic. Neck is supple. Mallampati  1. neck circumference:13.5  inches . Nasal airflow is patent.  Cardiovascular:   Regular rate and cardiac rhythm by pulse,  without distended neck veins. Respiratory: Lungs are clear to auscultation.  Skin:  Without evidence of ankle edema, or rash. Trunk: The patient's posture is erect.   Neurologic exam : The patient is awake and alert, oriented to place and time.   Memory subjective described as intact.  Attention span & concentration ability appears normal.  Speech is fluent, without  dysarthria, dysphonia or aphasia.  Mood and affect are bubbly, cooperative, friendly.    Cranial nerves: no loss of smell or taste reported - recovered.  Pupils are equal and briskly reactive to light. Funduscopic exam deferred.   Extraocular movements in vertical and horizontal planes were intact and without nystagmus. No Diplopia. Visual fields by finger perimetry are intact. Hearing was intact to soft voice and finger rubbing.   Facial sensation intact to fine touch.  Facial motor strength is symmetric and tongue and uvula move midline.  Neck ROM : rotation, tilt and flexion extension were normal for age and shoulder shrug was symmetrical.    Motor exam:  Symmetric bulk, tone and ROM.   Normal tone without cog wheeling, symmetric grip strength . Sensory:  Fine touch and vibration were tested  and  normal.  Proprioception tested in the upper extremities was normal.  Coordination: Rapid alternating movements in the fingers/hands were of normal speed.  Gait and station: Patient could rise unassisted from a seated position, walked without assistive device.  Toe and heel walk were deferred.  Deep tendon reflexes: in the  upper and lower extremities  are symmetric, brisk. No clonus.        After spending a total time of  35  minutes face to face time for physical and neurologic examination, review of laboratory studies,  personal review of imaging studies, reports and results of other testing and review of referral information / records as far as provided in visit, I have  established the following assessments:  1)  There is no evidence of hypersomnia and fatigue related to OSA, no snoring reported. physically low risk , BMI normal, open airways. 2) Myalgia and arthralgia , but no restless legs.   3)  Fatigue and EDS can be correlated in time to  Postviral syndrome with autoimmune disease- rheumatoid arthritis being diagnosed at the time. NOT NARCOLEPSY as neither symptoms of sleep paralysis, sleep attacks or vivid intrusive dreams are present.   4) dry eyes and mouth and headaches may correlate to non apnea hypoxemia or rheumatoid disease,SICCA.      My Plan is to proceed with:  1) I will screen for an organic sleep disorder, but the fatigue and sleepiness may not be related to heart dysfunction, pulmonary disease or OSA/ CSA nor PLMs.  2) continue postviral follow up with COVID clinic and discuss vaccine  3) if sleep study is normal = negative for organic sleep disorders such as apnea, no follow up is needed.  I would like to thank Merri Brunette, MD and Merri Brunette, Md [address] [place],  [place] [address] for allowing me to meet with and to take care of this pleasant patient.    I plan to follow up either personally or through our NP if sleep study is revealing any organic treatable cause of fatigue and EDS.  CC: I will share my notes with PCP and Dr. Pearlean Brownie, MD   Electronically signed by: Melvyn Novas, MD 10/18/2020 10:23 AM  [facility] Sleep Board certified by The [employer] of Sleep Medicine and  Fellow of the [employer] of Neurology. Medical Director of [employer].

## 2020-10-23 DIAGNOSIS — G4719 Other hypersomnia: Secondary | ICD-10-CM | POA: Diagnosis not present

## 2020-10-23 DIAGNOSIS — M791 Myalgia, unspecified site: Secondary | ICD-10-CM | POA: Diagnosis not present

## 2020-10-23 DIAGNOSIS — K588 Other irritable bowel syndrome: Secondary | ICD-10-CM | POA: Diagnosis not present

## 2020-10-23 DIAGNOSIS — R5383 Other fatigue: Secondary | ICD-10-CM | POA: Diagnosis not present

## 2020-10-26 ENCOUNTER — Other Ambulatory Visit (HOSPITAL_COMMUNITY): Payer: Self-pay | Admitting: Surgery

## 2020-10-26 DIAGNOSIS — H16223 Keratoconjunctivitis sicca, not specified as Sjogren's, bilateral: Secondary | ICD-10-CM | POA: Diagnosis not present

## 2020-10-26 DIAGNOSIS — H04123 Dry eye syndrome of bilateral lacrimal glands: Secondary | ICD-10-CM | POA: Diagnosis not present

## 2020-10-26 DIAGNOSIS — H524 Presbyopia: Secondary | ICD-10-CM | POA: Diagnosis not present

## 2020-10-26 MED FILL — XIIDRA 5 % SOLN: 5 | 90 days supply | Qty: 180 | Fill #0

## 2020-10-31 MED FILL — ADVAIR HFA 230-21 MCG INH: 230-21 | 30 days supply | Qty: 12 | Fill #3

## 2020-11-01 ENCOUNTER — Encounter: Payer: Self-pay | Admitting: *Deleted

## 2020-11-01 ENCOUNTER — Ambulatory Visit (INDEPENDENT_AMBULATORY_CARE_PROVIDER_SITE_OTHER): Payer: 59 | Admitting: Neurology

## 2020-11-01 ENCOUNTER — Other Ambulatory Visit: Payer: Self-pay

## 2020-11-01 DIAGNOSIS — G9331 Postviral fatigue syndrome: Secondary | ICD-10-CM

## 2020-11-01 DIAGNOSIS — G4710 Hypersomnia, unspecified: Secondary | ICD-10-CM | POA: Diagnosis not present

## 2020-11-01 DIAGNOSIS — G933 Postviral fatigue syndrome: Secondary | ICD-10-CM

## 2020-11-01 DIAGNOSIS — Z789 Other specified health status: Secondary | ICD-10-CM

## 2020-11-01 DIAGNOSIS — G4719 Other hypersomnia: Secondary | ICD-10-CM

## 2020-11-01 DIAGNOSIS — U099 Post covid-19 condition, unspecified: Secondary | ICD-10-CM

## 2020-11-01 DIAGNOSIS — R5382 Chronic fatigue, unspecified: Secondary | ICD-10-CM

## 2020-11-01 DIAGNOSIS — G9332 Myalgic encephalomyelitis/chronic fatigue syndrome: Secondary | ICD-10-CM

## 2020-11-02 ENCOUNTER — Encounter: Payer: Self-pay | Admitting: *Deleted

## 2020-11-02 NOTE — Progress Notes (Signed)
[facility] - 11/02/20 1100      Assessment    Assessment type Phone to patient    Is patient still in hospital? No    Date of hospital discharge after thrombolysis? 08/07/20      Final 90-Day Modified Rankin Score   Final 90-Day Modified Rankin Score: ([employer] [facility]) 1-Some symptoms from stroke remain, but able to carry out all usual activities      [id number]   Mobility 1- no problems in walking about    Self-care 1- no problems with Self-care    Usual activities 1- no problems with performing usual activities (e.g. work, study, housework, family or leisure activities)    Pain/discomfort 2- slight pain or discomfort    Anxiety/Depression --   Pt states pain may be from arthritis   What number between 0-100 best describes the patient's health state today (100 means the best health; 0 means the worst health)? 9      Hospital Admission   In the Past 3 months (since your initial hospitalisation for stroke), have you been admitted to hospital (including day-only procedures) for any reason? No      Doctor consultations   In the past 3 months (since your initial hospitalisation for stroke), have you seen any doctors or other health professional (for example physiotherapy, outpatient nurse, general practitioner) for any reason? Yes      a. Doctor consultations   a. Type of service Rheumatology    a. Condition or purpose arthritis    a. Date of appointment 08/28/20      b. Doctor consultations   b. Type of service Ophthalmology    b. Condition or purpose Dry Eyes    b. Date of appointment 09/14/20      c. Doctor consultations   c. Type of service Neurology    c. Condition or purpose F/U Stroke like episode    c. Date of appointment 10/03/20      d. Doctor consultation   d. Type of service Neurology    d. Condition or purpose Excessive Daytime Sleepiness    d. Date of appointment 10/18/20

## 2020-11-07 ENCOUNTER — Other Ambulatory Visit: Payer: Self-pay

## 2020-11-07 NOTE — Patient Outreach (Signed)
[facility]) Care Management  11/07/2020  Quinita Kostelecky Ditton March 14, 1971 [id number]   Telephone outreach by Darcel Bayley to patient to obtain mRS was successfully completed. MRS= 1 on 11/02/20  Thank you, Vanice Sarah [facility] Care Management Assistant

## 2020-11-14 NOTE — Progress Notes (Signed)
Audio and video analysis did not show any abnormal or unusual movements, behaviors, phonations or vocalizations.  Sleep architecture shows sleep fragmentation,  Very mild Snoring was noted. EKG was in keeping with normal sinus rhythm (NSR).   IMPRESSION: I can see a fragmented sleep architecture but no physiological cause for arousals.   1. No sleep disordered breathing, limb movements or cardiac irregular beats noted. Oxygen is at normal level throughout.  2. Diaphoresis is not related to cardiac rate / arrhythmia events,  3. Mild Primary Snoring   RECOMMENDATIONS:  I will send for HLA narcolepsy panel and consider hypocretin level testing to further explore the hypersomnia complaint.  I am also inclined to consider this a post viral fatigue syndrome related hypersomnia, and would consider modafinil to improve daytime sleepiness.

## 2020-11-14 NOTE — Procedures (Signed)
PATIENT'S NAME:  Natalie Ford, Natalie Ford DOB:      1971-05-24      MR#:    [id number]     DATE OF RECORDING: 11/01/2020 Natalie Ford REFERRING M.D.:  Natalie Pretty MD Study Performed:   Baseline Polysomnogram HISTORY:  Natalie Ford is a 50 y.o. right handed Caucasian female patient who is seen in [place] on 10/18/2020 from Dr Natalie Ford. 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.  Chief concern according to patient :"I wake up and I am just as tired", reports excessive daytime sleepiness.  The patient reportedly had COVID infection twice; in March 2020 (-but was not tested/ new disease), and again in December 20 -January 21.         The patient has not been vaccinated for Covid after consultation of infectious disease doctor and her rheumatologist.  She states, that prior to her (presumed second) Covid infection she did feel well and was able to prep dinner after work, she was not nearly as exhausted throughout the day, but now she is almost chronically fatigued.  She also was not so long ago newly diagnosed with rheumatoid arthritis and of course that can be an inflammatory autoimmune fatigue contributing as well.  But some of his fatigue seems to be post-viral.  Her sleep has not had a good quality.  She has seen Dr. Leonie Ford just on the 10/03/2020 for stroke-like episode.   Natalie Ford has a past medical history of Allergic rhinitis, COVID-19 infection twice, Dyspepsia, Hypercholesteremia, IBS (irritable bowel syndrome), Lumbosacral strain, RA (rheumatoid arthritis) ([place]), and TIA ([place]) (Dr Sethi-08/05/2020) she was seen on [facility] [address]. " I just have no energy'- "and: I was a runner before COVID".    The patient endorsed the Epworth Sleepiness Scale at 16/24 points.   The patient's weight 117 pounds with a height of 52 (inches), resulting in a BMI of 30.9 kg/m2. The patient's neck circumference measured 13.5 inches.  CURRENT MEDICATIONS: Ventolin  inhaler, Adderall, Aspirin, Wellbutrin, Flonase NS, Neurontin, Advil, Multivitamin, Fish oil, Systane Eyedrops for Hydration,   PROCEDURE:  This is a multichannel digital polysomnogram utilizing the Somnostar 11.2 system.  Electrodes and sensors were applied and monitored per AASM Specifications.   EEG, EOG, Chin and Limb EMG, were sampled at 200 Hz.  ECG, Snore and Nasal Pressure, Thermal Airflow, Respiratory Effort, CPAP Flow and Pressure, Oximetry was sampled at 50 Hz. Digital video and audio were recorded.      BASELINE STUDY: Lights Out was at 22:24 and Lights On at 05:17.  Total recording time (TRT) was 413.5 minutes, with a total sleep time (TST) of 395.5 minutes.   The patient's sleep latency was 6.5 minutes.  REM latency was 102.5 minutes.  The sleep efficiency was 95.6 %.     SLEEP ARCHITECTURE: WASO (Wake after sleep onset) was 15.5 minutes.  There were 18 minutes in Stage N1, 150 minutes Stage N2, 157 minutes Stage N3 and 70.5 minutes in Stage REM.  The percentage of Stage N1 was 4.6%, Stage N2 was 37.9%, Stage N3 was 39.7% and Stage R (REM sleep) was 17.8%.   RESPIRATORY ANALYSIS:  There were a total of 0 respiratory events. The patient also had 0 respiratory event related arousals (RERAs).     The total APNEA/HYPOPNEA INDEX (AHI) was 0/hour 0 events occurred in REM sleep and 0 events in NREM. The patient spent 395.5 minutes of total sleep time in the supine position and 0  minutes in non-supine.   OXYGEN SATURATION & C02:  The Wake baseline 02 saturation was 96%, with the lowest being 92%. Time spent below 89% saturation equaled 0 minutes.  The arousals were noted as: 45 were spontaneous, 0 were associated with PLMs, 0 were associated with respiratory events. The patient awoke more than once being diaphoretic, and sweat artefact impaired the electrodes. This occurred in non- REM sleep.  The patient had a total of 0 Periodic Limb Movements.    Audio and video analysis did not show any  abnormal or unusual movements, behaviors, phonations or vocalizations.  Sleep architecture shows sleep fragmentation,  Very mild Snoring was noted. EKG was in keeping with normal sinus rhythm (NSR).   IMPRESSION: I can see a fragmented sleep architecture but no physiological cause for arousals.   1. No sleep disordered breathing, limb movements or cardiac irregular beats noted. Oxygen is at normal level throughout.  2. Diaphoresis is not related to cardiac rate / arrhythmia events,  3. Mild Primary Snoring   RECOMMENDATIONS:  I will send for HLA narcolepsy panel and consider hypocretin level testing to further explore the hypersomnia complaint.  I am also inclined to consider this a post viral fatigue syndrome related hypersomnia, and would consider modafinil to improve daytime sleepiness.   I certify that I have reviewed the entire raw data recording prior to the issuance of this report in accordance with the Standards of Accreditation of the American Academy of Sleep Medicine (AASM)   Larey Seat, MD Diplomat, American Board of Psychiatry and Neurology  Diplomat, American Board of Sleep Medicine Market researcher, [employer] [facility] at [employer], Tax adviser of Neurology and Sleep Medicine (Neurology and Sleep Medicine)  '[]' Juanell Fairly, PhD Diplomat, American Board of Psychiatry and Neurology  Diplomat, American Board of Sleep Medicine

## 2020-11-15 ENCOUNTER — Encounter: Payer: Self-pay | Admitting: Neurology

## 2020-11-15 ENCOUNTER — Telehealth: Payer: Self-pay | Admitting: Neurology

## 2020-11-15 ENCOUNTER — Other Ambulatory Visit: Payer: Self-pay | Admitting: Neurology

## 2020-11-15 DIAGNOSIS — G9331 Postviral fatigue syndrome: Secondary | ICD-10-CM

## 2020-11-15 DIAGNOSIS — G933 Postviral fatigue syndrome: Secondary | ICD-10-CM

## 2020-11-15 DIAGNOSIS — G4719 Other hypersomnia: Secondary | ICD-10-CM

## 2020-11-15 NOTE — Telephone Encounter (Signed)
-----  Message from Larey Seat, MD sent at 11/14/2020  1:16 PM EDT ----- Audio and video analysis did not show any abnormal or unusual movements, behaviors, phonations or vocalizations.  Sleep architecture shows sleep fragmentation,  Very mild Snoring was noted. EKG was in keeping with normal sinus rhythm (NSR).   IMPRESSION: I can see a fragmented sleep architecture but no physiological cause for arousals.   1. No sleep disordered breathing, limb movements or cardiac irregular beats noted. Oxygen is at normal level throughout.  2. Diaphoresis is not related to cardiac rate / arrhythmia events,  3. Mild Primary Snoring   RECOMMENDATIONS:  I will send for HLA narcolepsy panel and consider hypocretin level testing to further explore the hypersomnia complaint.  I am also inclined to consider this a post viral fatigue syndrome related hypersomnia, and would consider modafinil to improve daytime sleepiness.

## 2020-11-15 NOTE — Telephone Encounter (Signed)
Called patient to discuss sleep study results. No answer at this time. LVM for the patient to call back.  Will send a mychart message as well. 

## 2020-11-22 ENCOUNTER — Other Ambulatory Visit (HOSPITAL_COMMUNITY): Payer: Self-pay

## 2020-11-28 ENCOUNTER — Other Ambulatory Visit (INDEPENDENT_AMBULATORY_CARE_PROVIDER_SITE_OTHER): Payer: Self-pay

## 2020-11-28 DIAGNOSIS — G933 Postviral fatigue syndrome: Secondary | ICD-10-CM

## 2020-11-28 DIAGNOSIS — G4719 Other hypersomnia: Secondary | ICD-10-CM | POA: Diagnosis not present

## 2020-11-28 DIAGNOSIS — Z0289 Encounter for other administrative examinations: Secondary | ICD-10-CM

## 2020-11-28 DIAGNOSIS — G9331 Postviral fatigue syndrome: Secondary | ICD-10-CM

## 2020-12-04 ENCOUNTER — Other Ambulatory Visit (HOSPITAL_COMMUNITY): Payer: Self-pay

## 2020-12-04 ENCOUNTER — Other Ambulatory Visit (HOSPITAL_COMMUNITY): Payer: Self-pay | Admitting: Rheumatology

## 2020-12-04 MED FILL — Etanercept Subcutaneous Solution Cartridge 50 MG/ML: SUBCUTANEOUS | 28 days supply | Qty: 4 | Fill #0 | Status: CN

## 2020-12-05 ENCOUNTER — Other Ambulatory Visit (HOSPITAL_COMMUNITY): Payer: Self-pay

## 2020-12-05 DIAGNOSIS — J45909 Unspecified asthma, uncomplicated: Secondary | ICD-10-CM | POA: Diagnosis not present

## 2020-12-05 DIAGNOSIS — M25539 Pain in unspecified wrist: Secondary | ICD-10-CM | POA: Diagnosis not present

## 2020-12-05 DIAGNOSIS — Z79899 Other long term (current) drug therapy: Secondary | ICD-10-CM | POA: Diagnosis not present

## 2020-12-05 DIAGNOSIS — M2550 Pain in unspecified joint: Secondary | ICD-10-CM | POA: Diagnosis not present

## 2020-12-05 DIAGNOSIS — M0609 Rheumatoid arthritis without rheumatoid factor, multiple sites: Secondary | ICD-10-CM | POA: Diagnosis not present

## 2020-12-05 DIAGNOSIS — M791 Myalgia, unspecified site: Secondary | ICD-10-CM | POA: Diagnosis not present

## 2020-12-05 DIAGNOSIS — K589 Irritable bowel syndrome without diarrhea: Secondary | ICD-10-CM | POA: Diagnosis not present

## 2020-12-05 DIAGNOSIS — U099 Post covid-19 condition, unspecified: Secondary | ICD-10-CM | POA: Diagnosis not present

## 2020-12-05 DIAGNOSIS — R5383 Other fatigue: Secondary | ICD-10-CM | POA: Diagnosis not present

## 2020-12-05 LAB — NARCOLEPSY EVALUATION
DQA1*01:02: NEGATIVE
DQB1*06:02: NEGATIVE

## 2020-12-05 NOTE — Progress Notes (Signed)
Negative HLA marker for narcolepsy

## 2020-12-06 ENCOUNTER — Other Ambulatory Visit (HOSPITAL_COMMUNITY): Payer: Self-pay

## 2020-12-06 ENCOUNTER — Other Ambulatory Visit (HOSPITAL_COMMUNITY): Payer: Self-pay | Admitting: Rheumatology

## 2020-12-06 ENCOUNTER — Encounter: Payer: Self-pay | Admitting: Neurology

## 2020-12-07 ENCOUNTER — Other Ambulatory Visit (HOSPITAL_COMMUNITY): Payer: Self-pay

## 2020-12-07 ENCOUNTER — Other Ambulatory Visit: Payer: Self-pay | Admitting: Rheumatology

## 2020-12-11 ENCOUNTER — Other Ambulatory Visit (HOSPITAL_COMMUNITY): Payer: Self-pay

## 2020-12-12 ENCOUNTER — Other Ambulatory Visit: Payer: Self-pay | Admitting: Pharmacist

## 2020-12-12 ENCOUNTER — Other Ambulatory Visit (HOSPITAL_COMMUNITY): Payer: Self-pay

## 2020-12-12 MED ORDER — ENBREL 50 MG/ML ~~LOC~~ SOSY
PREFILLED_SYRINGE | SUBCUTANEOUS | 1 refills | Status: DC
  Filled 2020-12-12: qty 4, 28d supply, fill #0
  Filled 2021-01-04: qty 4, 28d supply, fill #1

## 2020-12-12 MED ORDER — ENBREL 50 MG/ML ~~LOC~~ SOSY: PREFILLED_SYRINGE | SUBCUTANEOUS | 1 refills | Status: DC

## 2020-12-13 ENCOUNTER — Other Ambulatory Visit (HOSPITAL_COMMUNITY): Payer: Self-pay

## 2020-12-14 ENCOUNTER — Other Ambulatory Visit (HOSPITAL_COMMUNITY): Payer: Self-pay

## 2020-12-15 ENCOUNTER — Other Ambulatory Visit: Payer: Self-pay | Admitting: Psychiatry

## 2020-12-17 ENCOUNTER — Other Ambulatory Visit (HOSPITAL_COMMUNITY): Payer: Self-pay

## 2020-12-18 ENCOUNTER — Other Ambulatory Visit (HOSPITAL_COMMUNITY): Payer: Self-pay

## 2020-12-20 DIAGNOSIS — R5382 Chronic fatigue, unspecified: Secondary | ICD-10-CM | POA: Diagnosis not present

## 2020-12-20 DIAGNOSIS — U099 Post covid-19 condition, unspecified: Secondary | ICD-10-CM | POA: Diagnosis not present

## 2020-12-20 DIAGNOSIS — M0609 Rheumatoid arthritis without rheumatoid factor, multiple sites: Secondary | ICD-10-CM | POA: Diagnosis not present

## 2020-12-20 DIAGNOSIS — Z6821 Body mass index (BMI) 21.0-21.9, adult: Secondary | ICD-10-CM | POA: Diagnosis not present

## 2020-12-20 DIAGNOSIS — L659 Nonscarring hair loss, unspecified: Secondary | ICD-10-CM | POA: Diagnosis not present

## 2020-12-20 DIAGNOSIS — E041 Nontoxic single thyroid nodule: Secondary | ICD-10-CM | POA: Diagnosis not present

## 2020-12-20 DIAGNOSIS — J45909 Unspecified asthma, uncomplicated: Secondary | ICD-10-CM | POA: Diagnosis not present

## 2020-12-21 DIAGNOSIS — H16223 Keratoconjunctivitis sicca, not specified as Sjogren's, bilateral: Secondary | ICD-10-CM | POA: Diagnosis not present

## 2020-12-21 DIAGNOSIS — H04123 Dry eye syndrome of bilateral lacrimal glands: Secondary | ICD-10-CM | POA: Diagnosis not present

## 2020-12-22 ENCOUNTER — Other Ambulatory Visit (HOSPITAL_COMMUNITY): Payer: Self-pay

## 2020-12-28 ENCOUNTER — Other Ambulatory Visit: Payer: Self-pay

## 2020-12-28 ENCOUNTER — Emergency Department (HOSPITAL_BASED_OUTPATIENT_CLINIC_OR_DEPARTMENT_OTHER): Payer: 59

## 2020-12-28 ENCOUNTER — Emergency Department (HOSPITAL_BASED_OUTPATIENT_CLINIC_OR_DEPARTMENT_OTHER)
Admission: EM | Admit: 2020-12-28 | Discharge: 2020-12-28 | Disposition: A | Discharge status: Home / Self Care | Payer: 59 | Attending: Emergency Medicine | Admitting: Emergency Medicine

## 2020-12-28 ENCOUNTER — Encounter (HOSPITAL_BASED_OUTPATIENT_CLINIC_OR_DEPARTMENT_OTHER): Payer: Self-pay

## 2020-12-28 DIAGNOSIS — G43109 Migraine with aura, not intractable, without status migrainosus: Secondary | ICD-10-CM | POA: Diagnosis not present

## 2020-12-28 DIAGNOSIS — R519 Headache, unspecified: Secondary | ICD-10-CM | POA: Diagnosis present

## 2020-12-28 DIAGNOSIS — Z8616 Personal history of COVID-19: Secondary | ICD-10-CM | POA: Insufficient documentation

## 2020-12-28 DIAGNOSIS — H538 Other visual disturbances: Secondary | ICD-10-CM | POA: Diagnosis not present

## 2020-12-28 DIAGNOSIS — D72829 Elevated white blood cell count, unspecified: Secondary | ICD-10-CM | POA: Diagnosis not present

## 2020-12-28 DIAGNOSIS — J45909 Unspecified asthma, uncomplicated: Secondary | ICD-10-CM | POA: Insufficient documentation

## 2020-12-28 DIAGNOSIS — G4459 Other complicated headache syndrome: Secondary | ICD-10-CM | POA: Diagnosis not present

## 2020-12-28 DIAGNOSIS — R29898 Other symptoms and signs involving the musculoskeletal system: Secondary | ICD-10-CM | POA: Diagnosis not present

## 2020-12-28 DIAGNOSIS — R9431 Abnormal electrocardiogram [ECG] [EKG]: Secondary | ICD-10-CM | POA: Diagnosis not present

## 2020-12-28 DIAGNOSIS — Z7951 Long term (current) use of inhaled steroids: Secondary | ICD-10-CM | POA: Diagnosis not present

## 2020-12-28 DIAGNOSIS — Z7982 Long term (current) use of aspirin: Secondary | ICD-10-CM | POA: Diagnosis not present

## 2020-12-28 DIAGNOSIS — R202 Paresthesia of skin: Secondary | ICD-10-CM | POA: Diagnosis not present

## 2020-12-28 LAB — CBC WITH DIFFERENTIAL/PLATELET
Abs Immature Granulocytes: 0.04 10*3/uL (ref 0.00–0.07)
Basophils Absolute: 0 10*3/uL (ref 0.0–0.1)
Basophils Relative: 0 %
Eosinophils Absolute: 0 10*3/uL (ref 0.0–0.5)
Eosinophils Relative: 0 %
HCT: 42.3 % (ref 36.0–46.0)
Hemoglobin: 14.6 g/dL (ref 12.0–15.0)
Immature Granulocytes: 0 %
Lymphocytes Relative: 12 %
Lymphs Abs: 1.3 10*3/uL (ref 0.7–4.0)
MCH: 34 pg (ref 26.0–34.0)
MCHC: 34.5 g/dL (ref 30.0–36.0)
MCV: 98.6 fL (ref 80.0–100.0)
Monocytes Absolute: 0.6 10*3/uL (ref 0.1–1.0)
Monocytes Relative: 5 %
Neutro Abs: 9.1 10*3/uL — ABNORMAL HIGH (ref 1.7–7.7)
Neutrophils Relative %: 83 %
Platelets: 310 10*3/uL (ref 150–400)
RBC: 4.29 MIL/uL (ref 3.87–5.11)
RDW: 11.9 % (ref 11.5–15.5)
WBC: 11 10*3/uL — ABNORMAL HIGH (ref 4.0–10.5)
nRBC: 0 % (ref 0.0–0.2)

## 2020-12-28 LAB — BASIC METABOLIC PANEL
Anion gap: 10 (ref 5–15)
BUN: 12 mg/dL (ref 6–20)
CO2: 28 mmol/L (ref 22–32)
Calcium: 9.4 mg/dL (ref 8.9–10.3)
Chloride: 100 mmol/L (ref 98–111)
Creatinine, Ser: 1.07 mg/dL — ABNORMAL HIGH (ref 0.44–1.00)
GFR, Estimated: >60 mL/min (ref >60)
Glucose, Bld: 127 mg/dL — ABNORMAL HIGH (ref 70–99)
Potassium: 3.8 mmol/L (ref 3.5–5.1)
Sodium: 138 mmol/L (ref 135–145)

## 2020-12-28 MED ORDER — METOCLOPRAMIDE HCL 5 MG/ML IJ SOLN
10.0000 mg | Freq: Once | INTRAMUSCULAR | Status: AC
  Administered 2020-12-28: 10 mg via INTRAVENOUS
  Filled 2020-12-28: qty 2

## 2020-12-28 MED ORDER — DEXAMETHASONE SODIUM PHOSPHATE 10 MG/ML IJ SOLN
10.0000 mg | Freq: Once | INTRAMUSCULAR | Status: AC
  Administered 2020-12-28: 10 mg via INTRAVENOUS
  Filled 2020-12-28: qty 1

## 2020-12-28 MED ORDER — LACTATED RINGERS IV BOLUS
1000.0000 mL | Freq: Once | INTRAVENOUS | Status: AC
  Administered 2020-12-28: 1000 mL via INTRAVENOUS

## 2020-12-28 NOTE — ED Notes (Signed)
Report to EDP Plunkett and C. Merton Border, RN

## 2020-12-28 NOTE — Discharge Instructions (Signed)
Okay toWhen your headache goes away if the other symptoms are still there you should go to [facility] for MRI or if your symptoms become worse and you are having any difficulty walking or using your left side.  It is ok to take Tylenol as needed.

## 2020-12-28 NOTE — Care Plan (Signed)
Curb sided about this patient who recently presented in December 2021 with strokelike symptoms, most likely complex migraine (right-sided numbness, possible visual field cut, subjective difficulty speaking, received tPA, no residual deficits).  Also has a history of hyperlipidemia and migraine headaches but otherwise no significant stroke risk factors.  Presents today with pounding headache as well as numbness that started in her left arm and spread gradually into her face, with possibly some mild left leg ataxia on ED provider examination.  Overall story is most consistent with complex migraine given the gradual progression of her symptoms in association with headache as well as prior episode without MRI findings.  Additionally patient symptoms are improving with headache cocktail per ED provider.  She is already out of any tPA window and there are no signs of large vessel occlusion.  Therefore reasonable to discharge patient with migraine cocktails and counseling to re-present for evaluation to facility with MRI capabilities for evaluation should her symptoms not resolve.  Brooke Dare MD-PhD [facility] [phone]

## 2020-12-28 NOTE — ED Triage Notes (Addendum)
Pt c/o numbness to left UE and left LE and left side of face started ~830am-states she woke ~5am with a HA-NAD-steady gait

## 2020-12-28 NOTE — ED Provider Notes (Signed)
[facility] EMERGENCY DEPARTMENT Provider Note   CSN: [id number] Arrival date & time: 12/28/20  1259     History Chief Complaint  Patient presents with  . Numbness    Natalie Ford is a 50 y.o. female.  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  Patient woke up around 5 AM with a headache this morning and reports that headache is gradually worsened throughout the day.  Now her head is pounding.  It feels like a band all the way around her head and her eyes in the back of her head.  Earlier this morning around 830 she started noticing numbness in the left side of the face, arm and in the heel of her foot.  She felt intermittent episodes of feeling dizzy and still complains of blurry vision in both eyes.  She has had no sensitivity to light, nausea or vomiting.  She has not had difficulty with communicating but does note since her issues occurred in December that she occasionally has word finding difficulties.  She has no difficulty walking at this time.  She denies any recent illness with cough, fever, shortness of breath or congestion.  She has no abdominal pain.  She has been eating and drinking normally but does report that her doctors have told her she is dehydrated.        Past Medical History:  Diagnosis Date  . Allergic rhinitis   . COVID-19   . Dyspepsia   . Hypercholesteremia   . IBS (irritable bowel syndrome)   . Lumbosacral strain   . RA (rheumatoid arthritis) (HCC)   . Stroke [facility]) 08/05/2020   tia    Patient Active Problem List   Diagnosis Date Noted  . Excessive daytime sleepiness 10/18/2020  . COVID-19 long hauler manifesting chronic fatigue 10/18/2020  . Rheumatoid  factor negative 10/18/2020  . Autoimmune Addison's disease (HCC) 10/18/2020  . 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 Intermittent asthma 10/18/2020  . Stroke (HCC) 08/05/2020  . History of COVID-19 11/01/2019  . FUO (fever of unknown origin) 11/01/2019  . Cough 11/01/2019  . Shoulder impingement syndrome, left 06/22/2018  . Left leg pain 11/18/2017  . Carpal tunnel syndrome 10/01/2017  . Kidney stones 03/30/2014  . Physical exam, annual 06/29/2013  . Anxiety 06/29/2013  . Borderline hypercholesterolemia 05/14/2010  . DYSPEPSIA 05/14/2010  . LUMBOSACRAL STRAIN 05/14/2010  . Allergic rhinitis 05/15/2009  . UPPER RESPIRATORY INFECTION 05/09/2009  . GASTROENTERITIS 12/25/2007  . IRRITABLE BOWEL SYNDROME 12/25/2007    Past Surgical History:  Procedure Laterality Date  . CARPAL TUNNEL RELEASE Right   . CESAREAN SECTION     x 2     OB History   No obstetric history on file.     Family History  Problem Relation Age of Onset  . Diabetes Mother   . Heart disease Mother   . Stroke Mother   . Diabetes Sister   . Leukemia Father   . Stomach cancer Paternal Grandfather   . Kidney disease Paternal Aunt     Social History   Tobacco Use  . Smoking status: Never Smoker  . Smokeless tobacco: Never Used  . Tobacco comment: exposed to second hand smoke  Vaping Use  . Vaping Use: Never used  Substance Use  Topics  . Alcohol use: Yes    Comment: social use  . Drug use: No    Home Medications Prior to Admission medications   Medication Sig Start Date End Date Taking? Authorizing Provider  albuterol (VENTOLIN HFA) 108 (90 Base) MCG/ACT inhaler Inhale 1-2 puffs into the lungs every 6 (six) hours as needed for wheezing or shortness of breath. 12/20/19   Charlott Holler, MD  amphetamine-dextroamphetamine (ADDERALL) 20 MG tablet Take 10 mg by mouth in the morning, at noon, and at bedtime.    [provider]  aspirin EC 81 MG EC tablet Take 1 tablet (81 mg total) by mouth  daily. Swallow whole. 08/08/20   Rinehuls, Kinnie Scales, [name]  buPROPion (WELLBUTRIN XL) 150 MG 24 hr tablet Take 450 mg by mouth daily.    [provider]  etanercept (ENBREL) 50 MG/ML injection Inject 1 ml subcutaneously once a week 12/12/20   Quentin Angst, MD  fluticasone (FLONASE) 50 MCG/ACT nasal spray Place 2 sprays into both nostrils daily.    [provider]  fluticasone-salmeterol (ADVAIR HFA) 230-21 MCG/ACT inhaler Inhale 2 puffs into the lungs 2 (two) times daily. 01/26/20   Charlott Holler, MD  fluticasone-salmeterol (ADVAIR HFA) [phone] MCG/ACT inhaler INHALE 2 PUFFS INTO THE LUNGS TWICE DAILY 01/26/20 01/25/21  Charlott Holler, MD  gabapentin (NEURONTIN) 300 MG capsule Take 300-600 mg by mouth 3 (three) times daily as needed. 02/24/20   [provider]  ibuprofen (ADVIL,MOTRIN) 200 MG tablet Take 200 mg by mouth every 6 (six) hours as needed for headache or moderate pain.    [provider]  Lifitegrast 5 % SOLN INSTILL 1 DROP BOTH EYES TWICE A DAY 10/26/20 10/26/21  Dimitri Ped, MD  Loteprednol Etabonate 0.25 % SUSP PLACE 1 DROP INTO BOTH EYES 4 TIMES DAILY 09/18/20 09/18/21  Dimitri Ped, MD  Loteprednol Etabonate 0.25 % SUSP PLACE 1 DROP IN BOTH EYES 4 TIMES DAILY 09/14/20 09/14/21  Dimitri Ped, MD  Multiple Vitamin (MULTIVITAMIN) capsule Take 1 capsule by mouth at bedtime.    [provider]  Omega-3 Fatty Acids (FISH OIL) 1000 MG CAPS Take 2 capsules by mouth daily.    [provider]  Polyethyl Glycol-Propyl Glycol (SYSTANE HYDRATION PF OP) Place 1 drop into both eyes every 3 (three) hours as needed (dry eyes).    [provider]  predniSONE (DELTASONE) 5 MG tablet Take 10 mg by mouth daily. 10/16/20   [provider]  propranolol (INDERAL) 20 MG tablet Take 20 mg by mouth daily. 06/19/20   [provider]  rosuvastatin (CRESTOR) 10 MG tablet Take 10 mg by mouth daily.    [provider]  spironolactone (ALDACTONE) 50 MG tablet Take 75 mg by mouth daily. 03/06/20   [provider]    Allergies    Penicillins  Review of Systems   Review of Systems  All other systems reviewed and are negative.   Physical Exam Updated Vital Signs BP 90/61   Pulse 91   Temp 98.4 F (36.9 C) (Oral)   Resp 16   Ht  (1.499 m)   Wt 51.3 kg   LMP 11/23/2020   SpO2 100%   BMI 22.82 kg/m   Physical Exam Vitals and nursing note reviewed.  Constitutional:      General: She is not in acute distress.    Appearance: She is well-developed.  HENT:     Head: Normocephalic and atraumatic.  Eyes:     General: No visual field deficit.    Pupils: Pupils are equal, round, and reactive to light.  Cardiovascular:     Rate and Rhythm: Normal rate and regular rhythm.     Heart sounds: Normal heart sounds. No murmur heard. No friction rub.  Pulmonary:     Effort: Pulmonary effort is normal.     Breath sounds: Normal breath sounds. No wheezing or rales.  Abdominal:     General: Bowel sounds are normal. There is no distension.     Palpations: Abdomen is soft.     Tenderness: There is no abdominal tenderness. There is no guarding or rebound.  Musculoskeletal:        General: No tenderness. Normal range of motion.     Cervical back: Normal range of motion and neck supple. No tenderness.     Comments: No edema  Skin:    General: Skin is warm and dry.     Findings: No rash.  Neurological:     Mental Status: She is alert and oriented to person, place, and time.     Cranial Nerves: No cranial nerve deficit, dysarthria or facial asymmetry.     Motor: No weakness or pronator drift.     Coordination: Coordination normal. Heel to Shin Test abnormal. Finger-Nose-Finger Test normal.     Gait: Gait is intact.     Comments: Reports decreased sensation in the left arm and left side of the face.  No notable facial droop.  5 out of 5 strength in bilateral upper and lower  extremities.  No notable pronator drift.  Normal finger-to-nose testing.  Slight difficulty with heel-to-shin testing with the left foot but normal with the right foot.  Speech is clear and no evidence of aphasia  Psychiatric:        Mood and Affect: Mood normal.        Behavior: Behavior normal.     ED Results / Procedures / Treatments   Labs (all labs ordered are listed, but only abnormal results are displayed) Labs Reviewed  CBC WITH DIFFERENTIAL/PLATELET - Abnormal; Notable for the following components:      Result Value   WBC 11.0 (*)    Neutro Abs 9.1 (*)    All other components within normal limits  BASIC METABOLIC PANEL - Abnormal; Notable for the following components:   Glucose, Bld 127 (*)    Creatinine, Ser 1.07 (*)    All other components within normal limits    EKG EKG Interpretation  Date/Time:  Friday Dec 28 2020 13:51:46 EDT Ventricular Rate:  89 PR Interval:  145 QRS Duration: 85 QT Interval:  345 QTC Calculation: 420 R Axis:   71 Text Interpretation: Sinus rhythm Borderline T abnormalities, inferior leads No significant change since last tracing Confirmed by Gwyneth Sprout ([id number]) on 12/28/2020 1:59:26 PM   Radiology CT Head Wo Contrast  Result Date: 12/28/2020 CLINICAL DATA:  Left-sided weakness and numbness, initial encounter EXAM: CT HEAD WITHOUT CONTRAST TECHNIQUE: Contiguous axial images were obtained from the base of the skull through the vertex without intravenous contrast. COMPARISON:  08/06/2020 FINDINGS: Brain: No evidence of acute infarction, hemorrhage, hydrocephalus, extra-axial collection or mass lesion/mass effect. Stable prominent perivascular space in the left basal ganglia unchanged from the prior exam. Vascular: No hyperdense vessel or unexpected calcification. Skull: Normal. Negative for fracture or focal lesion. Sinuses/Orbits: No acute finding. Other: None. IMPRESSION: No acute intracranial abnormality noted. Electronically Signed   By:  Alcide Clever  M.D.   On: 12/28/2020 15:09    Procedures Procedures   Medications Ordered in ED Medications  dexamethasone (DECADRON) injection 10 mg (has no administration in time range)  metoCLOPramide (REGLAN) injection 10 mg (has no administration in time range)  lactated ringers bolus 1,000 mL (has no administration in time range)    ED Course  I have reviewed the triage vital signs and the nursing notes.  Pertinent labs & imaging results that were available during my care of the patient were reviewed by me and considered in my medical decision making (see chart for details).    MDM Rules/Calculators/A&P                          Patient presenting today with headache, numbness on the left side and some intermittent episodes of dizziness.  She is complaining of blurred vision but it is in both eyes and present even when 1 eye is closed at a time.  She was evaluated in December 2021 due to similar symptoms but numbness was on the right side and there was a right visual field cut.  She did receive tPA for the symptoms but subsequently MRI, CT and CTA were all negative.  There was thought of possible complicated migraine.  Patient was placed on 81 mg of aspirin daily which she continues to take.  Patient also has significant autoimmune issues with Addison's disease and rheumatoid arthritis.  She is immunocompromised with Enbrel and prednisone.  However she denies any recent infectious symptoms.  She is well-appearing on exam.  She does have slight difficulty with heel-to-shin testing with the left leg but otherwise no significant neurologic defects on exam.  Patient is outside of the tPA window as her symptoms started at 830 and she was evaluated 6 hours into having symptoms.  No LVO symptoms.  Concern for possible complicated migraine versus stroke versus electrolyte abnormality.  Low suspicion for ACS or dissection. Will give patient headache cocktail.  Head CT and labs are pending.  EKG  today without significant findings.   3:07 PM Patient has a minimal leukocytosis of 11, creatinine of 1.0 which is slightly higher than her baseline.  EKG without acute findings.  Head CT is still pending.  On reevaluation after having the medication she feels that the headache as improved.  She still feels slightly dizzy with closing her eyes and has since still some numbness in her left palm but feels like it is better than what it was.  We will touch base with neurology once CT imaging returns to ensure there is no other recommendations.  Patient does wish to go home and may benefit from abortive headache medication prescribed by her neurologist or PCP in the future.  3:18 PM CT head is neg.  Spoke with Dr. Iver Nestle given patient's symptoms are improving with headache improving and patient recently had all normal stroke test feel that it is reasonable that patient be discharged home.  However patient was cautioned that if her symptoms do not go away with resolution of headache or if they become worse she should return to [facility] for MRI testing.  Patient was comfortable with this plan.  MDM Number of Diagnoses or Management Options   Amount and/or Complexity of Data Reviewed Clinical lab tests: ordered and reviewed Tests in the radiology section of CPT: ordered and reviewed Obtain history from someone other than the patient: yes Discuss the patient with other providers: yes Independent visualization  of images, tracings, or specimens: yes     Final Clinical Impression(s) / ED Diagnoses Final diagnoses:  Complicated migraine    Rx / DC Orders ED Discharge Orders    None       Gwyneth Sprout, MD 12/28/20 1538

## 2021-01-02 ENCOUNTER — Other Ambulatory Visit (HOSPITAL_COMMUNITY): Payer: Self-pay

## 2021-01-04 ENCOUNTER — Other Ambulatory Visit (HOSPITAL_COMMUNITY): Payer: Self-pay

## 2021-01-09 ENCOUNTER — Other Ambulatory Visit (HOSPITAL_COMMUNITY): Payer: Self-pay

## 2021-02-04 ENCOUNTER — Other Ambulatory Visit (HOSPITAL_COMMUNITY): Payer: Self-pay

## 2021-02-05 DIAGNOSIS — M0609 Rheumatoid arthritis without rheumatoid factor, multiple sites: Secondary | ICD-10-CM | POA: Diagnosis not present

## 2021-02-05 DIAGNOSIS — R5383 Other fatigue: Secondary | ICD-10-CM | POA: Diagnosis not present

## 2021-02-05 DIAGNOSIS — J45909 Unspecified asthma, uncomplicated: Secondary | ICD-10-CM | POA: Diagnosis not present

## 2021-02-05 DIAGNOSIS — Z79899 Other long term (current) drug therapy: Secondary | ICD-10-CM | POA: Diagnosis not present

## 2021-02-05 DIAGNOSIS — M791 Myalgia, unspecified site: Secondary | ICD-10-CM | POA: Diagnosis not present

## 2021-02-05 DIAGNOSIS — M2550 Pain in unspecified joint: Secondary | ICD-10-CM | POA: Diagnosis not present

## 2021-02-05 DIAGNOSIS — H04123 Dry eye syndrome of bilateral lacrimal glands: Secondary | ICD-10-CM | POA: Diagnosis not present

## 2021-02-05 DIAGNOSIS — K589 Irritable bowel syndrome without diarrhea: Secondary | ICD-10-CM | POA: Diagnosis not present

## 2021-02-05 DIAGNOSIS — M25539 Pain in unspecified wrist: Secondary | ICD-10-CM | POA: Diagnosis not present

## 2021-02-07 ENCOUNTER — Other Ambulatory Visit (HOSPITAL_COMMUNITY): Payer: Self-pay

## 2021-02-12 ENCOUNTER — Other Ambulatory Visit (HOSPITAL_COMMUNITY): Payer: Self-pay

## 2021-02-12 MED FILL — Lifitegrast Ophth Soln 5%: OPHTHALMIC | 90 days supply | Qty: 180 | Fill #0 | Status: CN

## 2021-02-18 ENCOUNTER — Other Ambulatory Visit (HOSPITAL_COMMUNITY): Payer: Self-pay

## 2021-02-20 ENCOUNTER — Other Ambulatory Visit (HOSPITAL_COMMUNITY): Payer: Self-pay

## 2021-02-20 MED ORDER — ABATACEPT 250 MG IV SOLR
INTRAVENOUS | 6 refills | Status: DC
  Filled 2021-02-20 (×2): qty 4, 28d supply, fill #0

## 2021-02-20 MED ORDER — ABATACEPT 250 MG IV SOLR
INTRAVENOUS | 0 refills | Status: DC
  Filled 2021-02-20 (×4): qty 12, 28d supply, fill #0

## 2021-02-21 ENCOUNTER — Other Ambulatory Visit: Payer: Self-pay | Admitting: Pharmacist

## 2021-02-21 ENCOUNTER — Other Ambulatory Visit (HOSPITAL_COMMUNITY): Payer: Self-pay

## 2021-02-21 ENCOUNTER — Ambulatory Visit: Payer: 59 | Attending: Family Medicine | Admitting: Pharmacist

## 2021-02-21 ENCOUNTER — Other Ambulatory Visit (HOSPITAL_BASED_OUTPATIENT_CLINIC_OR_DEPARTMENT_OTHER): Payer: Self-pay

## 2021-02-21 ENCOUNTER — Other Ambulatory Visit: Payer: Self-pay

## 2021-02-21 DIAGNOSIS — Z79899 Other long term (current) drug therapy: Secondary | ICD-10-CM

## 2021-02-21 MED ORDER — ORENCIA 250 MG IV SOLR
INTRAVENOUS | 5 refills | Status: DC
  Filled 2021-02-21: qty 2, 28d supply, fill #0

## 2021-02-21 MED ORDER — ORENCIA 250 MG IV SOLR
INTRAVENOUS | 0 refills | Status: DC
  Filled 2021-02-21: qty 6, 42d supply, fill #0

## 2021-02-21 MED ORDER — ORENCIA 250 MG IV SOLR
INTRAVENOUS | 0 refills | Status: DC
  Filled 2021-02-21: qty 6, fill #0
  Filled 2021-02-21 – 2021-03-04 (×2): qty 6, 28d supply, fill #0
  Filled 2021-03-12: qty 4, 28d supply, fill #0
  Filled 2021-03-12: qty 6, 28d supply, fill #0

## 2021-02-21 MED ORDER — ORENCIA 250 MG IV SOLR
INTRAVENOUS | 5 refills | Status: DC
  Filled 2021-02-21: qty 2, fill #0
  Filled 2021-04-17: qty 2, 28d supply, fill #0
  Filled 2021-05-16: qty 2, 28d supply, fill #1
  Filled 2021-06-17: qty 2, 28d supply, fill #2
  Filled 2021-07-11: qty 2, 28d supply, fill #3
  Filled 2021-08-12: qty 2, 28d supply, fill #4
  Filled 2021-09-09 – 2021-09-30 (×3): qty 2, 28d supply, fill #5

## 2021-02-21 NOTE — Progress Notes (Signed)
   S: Patient presents for review of their specialty medication therapy.  Patient is about to start Orencia for RA. Patient is managed by Dr. Deanne Coffer for this. She has tried and failed Humira and Enbrel.   Adherence:  has not yet started  FDA-approved dosing: IV: dosing is according to body weight. This dose is given at 0 weeks, 2 weeks, 4 weeks, and then every 4 weeks after. <60 kg: 500 mg 60-100 kg: 750 mg  >100 kg: 1000 mg   **Of note, patient's last weight in Epic is 51.3 kg (12/28/2020). We received a rx for 1000 mg IV dose at 0, 2 , and 4 weeks.   Dose adjustments: Renal impairment: none Hepatic impairment: none Toxicity: discontinue if serious infection develops  Drug-drug interactions: none identified   Screenings: TB screening: completed  Hepatitis Screening: completed  Blood glucose: Orencia contains maltose which make falsely elevate glucose levels  Monitoring: S/sx of infection: has not started S/sx of hypersensitivity: has not started Other adverse effects: has not started  O: Lab Results  Component Value Date   WBC 11.0 (H) 12/28/2020   HGB 14.6 12/28/2020   HCT 42.3 12/28/2020   MCV 98.6 12/28/2020   PLT 310 12/28/2020      Chemistry      Component Value Date/Time   NA 138 12/28/2020 1346   K 3.8 12/28/2020 1346   CL 100 12/28/2020 1346   CO2 28 12/28/2020 1346   BUN 12 12/28/2020 1346   CREATININE 1.07 (H) 12/28/2020 1346   CREATININE 0.87 11/15/2019 1546      Component Value Date/Time   CALCIUM 9.4 12/28/2020 1346   ALKPHOS 33 (L) 08/05/2020 2000   AST 21 08/05/2020 2000   ALT 19 08/05/2020 2000   BILITOT 0.7 08/05/2020 2000       A/P: 1. Medication review: Patient is about to start Orencia for the treatment of rheumatoid arthritis. Reviewed the medication with the patient, including the following: Orencia is a selective T-cell costimulation blocker indicated for rheumatoid arthritis. Patient educated on purpose, proper use and potential  adverse effects of Orencia. The most common adverse effects are infections, headache, and injection site reactions. There is a possible adverse effect of increased risk of malignancy but it is not fully understood if this is due to the drug or the disease state itself. The patient was instructed to avoid use of live vaccinations without the approval of a physician. IV: Infuse over 30 minutes. Administer through a 0.2 to 1.2 micron low protein-binding filter. SubQ: Allow prefilled syringe and autoinjector to warm to room temperature (for 30 to 60 minutes and 30 minutes, respectively) prior to administration. Inject into the front of the thigh (preferred), abdomen (except for 2-inch area around the navel), or the outer area of the upper arms (if administered by a caregiver). Rotate injection sites (?1 inch apart); do not administer into tender, bruised, red, or hard skin.   I Homero Fellers in our specialty pharmacy about changing the dosing. Based on her weight, pt should be loaded with 500 mg at 0, 2 and 4 weeks. Per Tawanna Cooler, Dr. Dereck Ligas office will send in an updated rx. Will send this in once received.   Butch Penny, PharmD, Patsy [place], CPP Clinical Pharmacist [facility] [phone]

## 2021-02-22 ENCOUNTER — Other Ambulatory Visit (HOSPITAL_COMMUNITY): Payer: Self-pay

## 2021-02-26 ENCOUNTER — Other Ambulatory Visit (HOSPITAL_COMMUNITY): Payer: Self-pay

## 2021-02-28 ENCOUNTER — Other Ambulatory Visit (HOSPITAL_COMMUNITY): Payer: Self-pay

## 2021-03-04 ENCOUNTER — Other Ambulatory Visit (HOSPITAL_COMMUNITY): Payer: Self-pay

## 2021-03-07 ENCOUNTER — Other Ambulatory Visit (HOSPITAL_COMMUNITY): Payer: Self-pay

## 2021-03-07 ENCOUNTER — Other Ambulatory Visit: Payer: Self-pay | Admitting: Internal Medicine

## 2021-03-07 MED ORDER — ADVAIR HFA 230-21 MCG/ACT IN AERO
2.0000 | INHALATION_SPRAY | Freq: Two times a day (BID) | RESPIRATORY_TRACT | 0 refills | Status: DC
  Filled 2021-03-07: qty 12, 30d supply, fill #0

## 2021-03-08 ENCOUNTER — Other Ambulatory Visit (HOSPITAL_COMMUNITY): Payer: Self-pay

## 2021-03-12 ENCOUNTER — Other Ambulatory Visit (HOSPITAL_COMMUNITY): Payer: Self-pay

## 2021-03-13 ENCOUNTER — Other Ambulatory Visit (HOSPITAL_COMMUNITY): Payer: Self-pay

## 2021-03-14 ENCOUNTER — Encounter: Payer: Self-pay | Admitting: Emergency Medicine

## 2021-03-14 ENCOUNTER — Other Ambulatory Visit: Payer: Self-pay

## 2021-03-14 ENCOUNTER — Ambulatory Visit
Admission: EM | Admit: 2021-03-14 | Discharge: 2021-03-14 | Disposition: A | Discharge status: Home / Self Care | Payer: 59 | Attending: Emergency Medicine | Admitting: Emergency Medicine

## 2021-03-14 DIAGNOSIS — J22 Unspecified acute lower respiratory infection: Secondary | ICD-10-CM | POA: Diagnosis not present

## 2021-03-14 MED ORDER — BENZONATATE 200 MG PO CAPS: 200.0000 mg | ORAL_CAPSULE | Freq: Three times a day (TID) | ORAL | 0 refills | Status: AC | PRN

## 2021-03-14 MED ORDER — DOXYCYCLINE HYCLATE 100 MG PO CAPS: 100.0000 mg | ORAL_CAPSULE | Freq: Two times a day (BID) | ORAL | 0 refills | Status: AC

## 2021-03-14 NOTE — Discharge Instructions (Addendum)
Doxycycline twice daily for 1 week May use Tessalon every 8 hours for cough Mucinex DM twice daily for cough and congestion Continue Flonase, add in daily Claritin or Zyrtec Rest and fluids Honey and hot tea Follow-up if not improving or worsening

## 2021-03-14 NOTE — ED Provider Notes (Signed)
[facility]    CSN: [id number] Arrival date & time: 03/14/21  1520      History   Chief Complaint Chief Complaint  Patient presents with   Sore Throat   Otalgia    HPI Stefana Rashel Okeefe is a 50 y.o. female history of Addison's disease, presenting today for evaluation of URI symptoms.  Reports associated fevers, sore throat, ear pain and cough.  Reports symptoms for approximately 1 week.  Reports subjective fevers in the evening.  Over the past couple days has developed a discomfort in her central chest which has been constant.  Denies any notable wheezing.  Did attempt to use albuterol inhaler at home.  Using Flonase, DayQuil without full relief of symptoms.  Most bothersome symptom is her sore throat which is more prominent on right side and radiating to right ear.  Prior COVID testing negative.  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   Postviral fatigue syndrome 10/18/2020   Intermittent asthma 10/18/2020   Stroke (HCC) 08/05/2020   History of COVID-19 11/01/2019   FUO (fever of unknown origin) 11/01/2019   Cough 11/01/2019   Shoulder impingement syndrome, left 06/22/2018   Left leg pain 11/18/2017   Carpal tunnel syndrome 10/01/2017   Kidney stones 03/30/2014   Physical exam, annual 06/29/2013   Anxiety 06/29/2013   Borderline hypercholesterolemia 05/14/2010   DYSPEPSIA 05/14/2010   LUMBOSACRAL STRAIN 05/14/2010   Allergic rhinitis 05/15/2009   UPPER RESPIRATORY INFECTION 05/09/2009   GASTROENTERITIS  12/25/2007   IRRITABLE BOWEL SYNDROME 12/25/2007    Past Surgical History:  Procedure Laterality Date   CARPAL TUNNEL RELEASE Right    CESAREAN SECTION     x 2    OB History   No obstetric history on file.      Home Medications    Prior to Admission medications   Medication Sig Start Date End Date Taking? Authorizing Provider  benzonatate (TESSALON) 200 MG capsule Take 1 capsule (200 mg total) by mouth 3 (three) times daily as needed for up to 7 days for cough. 03/14/21 03/21/21 Yes Marcile Fuquay C, [name]  doxycycline (VIBRAMYCIN) 100 MG capsule Take 1 capsule (100 mg total) by mouth 2 (two) times daily for 7 days. 03/14/21 03/21/21 Yes Erandi Lemma C, [name]  abatacept (ORENCIA) 250 MG injection Use as directed Intravenous loading doses weeks 0,2 and 4 42 days 02/21/21   Quentin Angst, MD  abatacept (ORENCIA) 250 MG injection Use as directed Intravenous 500 mg every 28 days 02/21/21   Quentin Angst, MD  albuterol (VENTOLIN HFA) 108 (90 Base) MCG/ACT inhaler Inhale 1-2 puffs into the lungs every 6 (six) hours as needed for wheezing or shortness of breath. 12/20/19   Charlott Holler, MD  amphetamine-dextroamphetamine (ADDERALL) 20 MG tablet Take 10 mg by mouth in the morning, at noon, and at bedtime.    [provider]  aspirin EC 81 MG EC tablet Take 1 tablet (81 mg total) by mouth daily. Swallow whole. 08/08/20  Rinehuls, Kinnie Scales, [name]  buPROPion (WELLBUTRIN XL) 150 MG 24 hr tablet Take 450 mg by mouth daily.    [provider]  fluticasone (FLONASE) 50 MCG/ACT nasal spray Place 2 sprays into both nostrils daily.    [provider]  fluticasone-salmeterol (ADVAIR HFA) 230-21 MCG/ACT inhaler Inhale 2 puffs into the lungs 2 (two) times daily. 01/26/20   Charlott Holler, MD  fluticasone-salmeterol (ADVAIR HFA) 5157140802 MCG/ACT inhaler INHALE 2 PUFFS INTO THE LUNGS TWICE DAILY 03/07/21 03/07/22  Charlott Holler, MD  gabapentin (NEURONTIN) 300 MG capsule  Take 300-600 mg by mouth 3 (three) times daily as needed. 02/24/20   [provider]  ibuprofen (ADVIL,MOTRIN) 200 MG tablet Take 200 mg by mouth every 6 (six) hours as needed for headache or moderate pain.    [provider]  Lifitegrast 5 % SOLN INSTILL 1 DROP BOTH EYES TWICE A DAY 10/26/20 10/26/21  Dimitri Ped, MD  Loteprednol Etabonate 0.25 % SUSP PLACE 1 DROP INTO BOTH EYES 4 TIMES DAILY 09/18/20 09/18/21  Dimitri Ped, MD  Loteprednol Etabonate 0.25 % SUSP PLACE 1 DROP IN BOTH EYES 4 TIMES DAILY 09/14/20 09/14/21  Dimitri Ped, MD  Multiple Vitamin (MULTIVITAMIN) capsule Take 1 capsule by mouth at bedtime.    [provider]  Omega-3 Fatty Acids (FISH OIL) 1000 MG CAPS Take 2 capsules by mouth daily.    [provider]  Polyethyl Glycol-Propyl Glycol (SYSTANE HYDRATION PF OP) Place 1 drop into both eyes every 3 (three) hours as needed (dry eyes).    [provider]  predniSONE (DELTASONE) 5 MG tablet Take 10 mg by mouth daily. 10/16/20   [provider]  propranolol (INDERAL) 20 MG tablet Take 20 mg by mouth daily. 06/19/20   [provider]  rosuvastatin (CRESTOR) 10 MG tablet Take 10 mg by mouth daily.    [provider]  spironolactone (ALDACTONE) 50 MG tablet Take 75 mg by mouth daily. 03/06/20   [provider]    Family History Family History  Problem Relation Age of Onset   Diabetes Mother    Heart disease Mother    Stroke Mother    Diabetes Sister    Leukemia Father    Stomach cancer Paternal Grandfather    Kidney disease Paternal Aunt     Social History Social History   Tobacco Use   Smoking status: Never   Smokeless tobacco: Never   Tobacco comments:    exposed to second hand smoke  Vaping Use   Vaping Use: Never used  Substance Use Topics   Alcohol use: Yes    Comment: social use   Drug use: No     Allergies   Penicillins   Review of Systems Review of  Systems  Constitutional:  Positive for fever. Negative for activity change, appetite change, chills and fatigue.  HENT:  Positive for congestion, ear pain, rhinorrhea and sore throat. Negative for sinus pressure and trouble swallowing.   Eyes:  Negative for discharge and redness.  Respiratory:  Positive for cough. Negative for chest tightness and shortness of breath.   Cardiovascular:  Negative for chest pain.  Gastrointestinal:  Negative for abdominal pain, diarrhea, nausea and vomiting.  Musculoskeletal:  Negative for myalgias.  Skin:  Negative for rash.  Neurological:  Negative for dizziness, light-headedness and headaches.    Physical Exam Triage Vital Signs ED Triage Vitals  Enc Vitals Group     BP  Pulse      Resp      Temp      Temp src      SpO2      Weight      Height      Head Circumference      Peak Flow      Pain Score      Pain Loc      Pain Edu?      Excl. in GC?    No data found.  Updated Vital Signs BP (!) 145/87 (BP Location: Right Arm)   Pulse 84   Temp 98 F (36.7 C) (Oral)   Resp 18   SpO2 95%   Visual Acuity Right Eye Distance:   Left Eye Distance:   Bilateral Distance:    Right Eye Near:   Left Eye Near:    Bilateral Near:     Physical Exam Vitals and nursing note reviewed.  Constitutional:      Appearance: She is well-developed.     Comments: No acute distress  HENT:     Head: Normocephalic and atraumatic.     Ears:     Comments: Bilateral ears without tenderness to palpation of external auricle, tragus and mastoid, EAC's without erythema or swelling, TM's with good bony landmarks and cone of light. Non erythematous.      Nose: Nose normal.     Mouth/Throat:     Comments: Oral mucosa pink and moist, no tonsillar enlargement or exudate. Posterior pharynx patent and nonerythematous, no uvula deviation or swelling. Normal phonation.  Eyes:     Conjunctiva/sclera: Conjunctivae normal.  Cardiovascular:     Rate and Rhythm:  Normal rate.  Pulmonary:     Effort: Pulmonary effort is normal. No respiratory distress.     Comments: Breathing comfortably at rest, CTABL, no wheezing, rales or other adventitious sounds auscultated   Abdominal:     General: There is no distension.  Musculoskeletal:        General: Normal range of motion.     Cervical back: Neck supple.  Skin:    General: Skin is warm and dry.  Neurological:     Mental Status: She is alert and oriented to person, place, and time.     UC Treatments / Results  Labs (all labs ordered are listed, but only abnormal results are displayed) Labs Reviewed - No data to display  EKG   Radiology No results found.  Procedures Procedures (including critical care time)  Medications Ordered in UC Medications - No data to display  Initial Impression / Assessment and Plan / UC Course  I have reviewed the triage vital signs and the nursing notes.  Pertinent labs & imaging results that were available during my care of the patient were reviewed by me and considered in my medical decision making (see chart for details).     URI symptoms x1 week-covering with doxycycline, recommend continued symptomatic and supportive care of cough and congestion as well, continue inhaler as needed, patient is already on prednisone 15 mg daily, deferring further steroids.  Discussed strict return precautions. Patient verbalized understanding and is agreeable with plan.  Final Clinical Impressions(s) / UC Diagnoses   Final diagnoses:  Lower respiratory infection (e.g., bronchitis, pneumonia, pneumonitis, pulmonitis)     Discharge Instructions      Doxycycline twice daily for 1 week May use Tessalon every 8 hours for cough Mucinex DM twice daily for cough and congestion Continue Flonase, add in daily Claritin or  Zyrtec Rest and fluids Honey and hot tea Follow-up if not improving or worsening     ED Prescriptions     Medication Sig Dispense Auth. Provider    doxycycline (VIBRAMYCIN) 100 MG capsule Take 1 capsule (100 mg total) by mouth 2 (two) times daily for 7 days. 14 capsule Rossie Scarfone C, [name]   benzonatate (TESSALON) 200 MG capsule Take 1 capsule (200 mg total) by mouth 3 (three) times daily as needed for up to 7 days for cough. 28 capsule Litzi Binning, [place] C, [name]      PDMP not reviewed this encounter.   Jlynn Langille, Ewing C, [name] 03/14/21 1600

## 2021-03-14 NOTE — ED Triage Notes (Signed)
Pt sts sore throat and right ear pain x 1 week; pt with negative covid test; pt sts some coughing and having pain with cough; sts using inhaler; pt sts some fever in evenings

## 2021-03-18 ENCOUNTER — Telehealth: Payer: 59 | Admitting: Nurse Practitioner

## 2021-03-18 DIAGNOSIS — J22 Unspecified acute lower respiratory infection: Secondary | ICD-10-CM | POA: Diagnosis not present

## 2021-03-18 MED ORDER — AZITHROMYCIN 250 MG PO TABS: ORAL_TABLET | ORAL | 0 refills | Status: AC

## 2021-03-18 MED ORDER — PREDNISONE 5 MG PO TABS: 15.0000 mg | ORAL_TABLET | Freq: Two times a day (BID) | ORAL | 0 refills | Status: AC

## 2021-03-18 NOTE — Progress Notes (Signed)
Virtual Visit Consent   Natalie Ford, you are scheduled for a virtual visit with a [facility] provider today.     Just as with appointments in the office, your consent must be obtained to participate.  Your consent will be active for this visit and any virtual visit you may have with one of our providers in the next 365 days.     If you have a MyChart account, a copy of this consent can be sent to you electronically.  All virtual visits are billed to your insurance company just like a traditional visit in the office.    As this is a virtual visit, video technology does not allow for your provider to perform a traditional examination.  This may limit your provider's ability to fully assess your condition.  If your provider identifies any concerns that need to be evaluated in person or the need to arrange testing (such as labs, EKG, etc.), we will make arrangements to do so.     Although advances in technology are sophisticated, we cannot ensure that it will always work on either your end or our end.  If the connection with a video visit is poor, the visit may have to be switched to a telephone visit.  With either a video or telephone visit, we are not always able to ensure that we have a secure connection.     I need to obtain your verbal consent now.   Are you willing to proceed with your visit today?    Jeaneen Cala Macquarrie has provided verbal consent on 03/18/2021 for a virtual visit (video or telephone).   Viviano Simas, FNP   Date: 03/18/2021 8:48 AM   Virtual Visit via Video Note   I, Viviano Simas, connected with  Shatasha Lambing Cerda  ([id number], 07-21-1971) on 03/18/21 at  9:00 AM EDT by a video-enabled telemedicine application and verified that I am speaking with the correct person using two identifiers.  Location: Patient: Virtual Visit Location Patient: Home Provider: Virtual Visit Location Provider: Office/Clinic   I discussed the limitations of evaluation and  management by telemedicine and the availability of in person appointments. The patient expressed understanding and agreed to proceed.    History of Present Illness: Natalie Ford is a 50 y.o. who identifies as a female who was assigned female at birth, and is being seen today with complaints of ongoing cold symptoms for 10 days now. Symptom onset was 03/08/2021 with sore throat and ear pressure. She used over the counter medication for one week and then was seen at [facility] last Thursday 03/14/21. She was started on doxycycline at that time.  Has been feeling worse over the weekend. More pressure in ears, sore throat and mild dry cough without wheezing or shortness of breath.   She has a history of pneumonia in the past, has been vaccinated.   She had a COVID test at UC negative.   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   Postviral fatigue syndrome 10/18/2020   Intermittent asthma 10/18/2020   Stroke (HCC) 08/05/2020   History of COVID-19 11/01/2019   FUO (fever of unknown origin)  11/01/2019   Cough 11/01/2019   Shoulder impingement syndrome, left 06/22/2018   Left leg pain 11/18/2017   Carpal tunnel syndrome 10/01/2017   Kidney stones 03/30/2014   Physical exam, annual 06/29/2013   Anxiety 06/29/2013   Borderline hypercholesterolemia 05/14/2010   DYSPEPSIA 05/14/2010   LUMBOSACRAL STRAIN 05/14/2010   Allergic rhinitis 05/15/2009   UPPER RESPIRATORY INFECTION 05/09/2009   GASTROENTERITIS 12/25/2007   IRRITABLE BOWEL SYNDROME 12/25/2007    Allergies:  Allergies  Allergen Reactions   Penicillins     rash   Medications:   Current Outpatient Medications:    abatacept (ORENCIA) 250 MG injection, Use as directed Intravenous loading doses weeks 0,2 and 4 42 days, Disp: 6 each, Rfl: 0   abatacept (ORENCIA) 250 MG injection, Use as directed Intravenous 500 mg every 28 days, Disp: 2 each, Rfl: 5   albuterol (VENTOLIN HFA) 108 (90 Base) MCG/ACT inhaler, Inhale 1-2 puffs into the lungs every 6 (six) hours as needed for wheezing or shortness of breath., Disp: 18 g, Rfl: 5   amphetamine-dextroamphetamine (ADDERALL) 20 MG tablet, Take 10 mg by mouth in the morning, at noon, and at bedtime., Disp: , Rfl:    aspirin EC 81 MG EC tablet, Take 1 tablet (81 mg total) by mouth daily. Swallow whole., Disp: 30 tablet, Rfl: 11   benzonatate (TESSALON) 200 MG capsule, Take 1 capsule (200 mg total) by mouth 3 (three) times daily as needed for up to 7 days for cough., Disp: 28 capsule, Rfl: 0   buPROPion (WELLBUTRIN XL) 150 MG 24 hr tablet, Take 450 mg by mouth daily., Disp: , Rfl:    doxycycline (VIBRAMYCIN) 100 MG capsule, Take 1 capsule (100 mg total) by mouth 2 (two) times daily for 7 days., Disp: 14 capsule, Rfl: 0   fluticasone (FLONASE) 50 MCG/ACT nasal spray, Place 2 sprays into both nostrils daily., Disp: , Rfl:    fluticasone-salmeterol (ADVAIR HFA) 230-21 MCG/ACT inhaler, Inhale 2 puffs into the lungs 2 (two) times daily., Disp: 1 Inhaler, Rfl: 12   fluticasone-salmeterol (ADVAIR HFA) 230-21 MCG/ACT inhaler, INHALE 2 PUFFS INTO THE LUNGS TWICE DAILY, Disp: 12 g, Rfl: 0   gabapentin (NEURONTIN) 300 MG capsule, Take 300-600 mg by mouth 3 (three) times daily as needed., Disp: , Rfl:    ibuprofen (ADVIL,MOTRIN) 200 MG tablet, Take 200 mg by mouth every 6 (six) hours as needed for headache or moderate pain., Disp: , Rfl:    Lifitegrast 5 % SOLN, INSTILL 1 DROP BOTH EYES TWICE A DAY, Disp: 180 each, Rfl: 4   Loteprednol Etabonate 0.25 % SUSP, PLACE 1 DROP INTO BOTH EYES 4 TIMES DAILY, Disp: 41.5 mL, Rfl: 0   Loteprednol Etabonate 0.25  % SUSP, PLACE 1 DROP IN BOTH EYES 4 TIMES DAILY, Disp: 8.3 mL, Rfl: 0   Multiple Vitamin (MULTIVITAMIN) capsule, Take 1 capsule by mouth at bedtime., Disp: , Rfl:    Omega-3 Fatty Acids (FISH OIL) 1000 MG CAPS, Take 2 capsules by mouth daily., Disp: , Rfl:    Polyethyl Glycol-Propyl Glycol (SYSTANE HYDRATION PF OP), Place 1 drop into both eyes every 3 (three) hours as needed (dry eyes)., Disp: , Rfl:    predniSONE (DELTASONE) 5 MG tablet, Take 10 mg by mouth daily., Disp: , Rfl:    propranolol (INDERAL) 20 MG tablet, Take 20 mg by mouth daily., Disp: , Rfl:    rosuvastatin (CRESTOR) 10 MG tablet, Take 10 mg by mouth daily., Disp: , Rfl:    spironolactone (ALDACTONE) 50  MG tablet, Take 75 mg by mouth daily., Disp: , Rfl:   Observations/Objective: Patient is well-developed, well-nourished in no acute distress.  Resting comfortably at home.  Head is normocephalic, atraumatic.  No labored breathing.  Speech is clear and coherent with logical content.  Patient is alert and oriented at baseline.    Assessment and Plan: 1. Lower respiratory infection Patient will hold daily prednisone dose and do 5 tay burst as prescribed:  - predniSONE (DELTASONE) 5 MG tablet; Take 3 tablets (15 mg total) by mouth 2 (two) times daily with a meal for 5 days.  Dispense: 30 tablet; Refill: 0  Stop Doxycyline and start Z Pak tonight  - azithromycin (ZITHROMAX) 250 MG tablet; Take 2 tablets on day 1, then 1 tablet daily on days 2 through 5  Dispense: 6 tablet; Refill: 0    Continue Flonase and OTC decongestant as needed  Follow Up Instructions: I discussed the assessment and treatment plan with the patient. The patient was provided an opportunity to ask questions and all were answered. The patient agreed with the plan and demonstrated an understanding of the instructions.  A copy of instructions were sent to the patient via MyChart.  The patient was advised to call back or seek an in-person evaluation if the  symptoms worsen or if the condition fails to improve as anticipated.  Time:  I spent 12 minutes with the patient via telehealth technology discussing the above problems/concerns.    Viviano Simas, FNP

## 2021-03-22 ENCOUNTER — Other Ambulatory Visit (HOSPITAL_COMMUNITY): Payer: Self-pay

## 2021-04-02 ENCOUNTER — Ambulatory Visit: Payer: 59 | Admitting: Neurology

## 2021-04-03 DIAGNOSIS — M0609 Rheumatoid arthritis without rheumatoid factor, multiple sites: Secondary | ICD-10-CM | POA: Diagnosis not present

## 2021-04-04 ENCOUNTER — Other Ambulatory Visit (HOSPITAL_COMMUNITY): Payer: Self-pay

## 2021-04-04 DIAGNOSIS — H16223 Keratoconjunctivitis sicca, not specified as Sjogren's, bilateral: Secondary | ICD-10-CM | POA: Diagnosis not present

## 2021-04-04 DIAGNOSIS — H04123 Dry eye syndrome of bilateral lacrimal glands: Secondary | ICD-10-CM | POA: Diagnosis not present

## 2021-04-04 MED ORDER — CEQUA 0.09 % OP SOLN
OPHTHALMIC | 3 refills | Status: AC
Start: 2021-04-04 — End: ?
  Filled 2021-04-04: qty 60, 30d supply, fill #0
  Filled 2021-04-19: qty 180, 90d supply, fill #0

## 2021-04-08 ENCOUNTER — Other Ambulatory Visit (HOSPITAL_COMMUNITY): Payer: Self-pay

## 2021-04-17 ENCOUNTER — Other Ambulatory Visit (HOSPITAL_COMMUNITY): Payer: Self-pay

## 2021-04-17 DIAGNOSIS — M0609 Rheumatoid arthritis without rheumatoid factor, multiple sites: Secondary | ICD-10-CM | POA: Diagnosis not present

## 2021-04-18 DIAGNOSIS — Z79899 Other long term (current) drug therapy: Secondary | ICD-10-CM | POA: Diagnosis not present

## 2021-04-18 DIAGNOSIS — U099 Post covid-19 condition, unspecified: Secondary | ICD-10-CM | POA: Diagnosis not present

## 2021-04-18 DIAGNOSIS — M2550 Pain in unspecified joint: Secondary | ICD-10-CM | POA: Diagnosis not present

## 2021-04-18 DIAGNOSIS — M25539 Pain in unspecified wrist: Secondary | ICD-10-CM | POA: Diagnosis not present

## 2021-04-18 DIAGNOSIS — J45909 Unspecified asthma, uncomplicated: Secondary | ICD-10-CM | POA: Diagnosis not present

## 2021-04-18 DIAGNOSIS — M0609 Rheumatoid arthritis without rheumatoid factor, multiple sites: Secondary | ICD-10-CM | POA: Diagnosis not present

## 2021-04-18 DIAGNOSIS — K589 Irritable bowel syndrome without diarrhea: Secondary | ICD-10-CM | POA: Diagnosis not present

## 2021-04-18 DIAGNOSIS — R5383 Other fatigue: Secondary | ICD-10-CM | POA: Diagnosis not present

## 2021-04-18 DIAGNOSIS — M791 Myalgia, unspecified site: Secondary | ICD-10-CM | POA: Diagnosis not present

## 2021-04-19 ENCOUNTER — Other Ambulatory Visit (HOSPITAL_COMMUNITY): Payer: Self-pay

## 2021-04-19 MED ORDER — AMPHETAMINE-DEXTROAMPHETAMINE 20 MG PO TABS
ORAL_TABLET | ORAL | 0 refills | Status: DC
  Filled 2021-04-19: qty 30, 30d supply, fill #0

## 2021-04-19 MED ORDER — LISDEXAMFETAMINE DIMESYLATE 70 MG PO CAPS
ORAL_CAPSULE | ORAL | 0 refills | Status: DC
  Filled 2021-04-19: qty 30, 30d supply, fill #0

## 2021-04-22 ENCOUNTER — Other Ambulatory Visit (HOSPITAL_COMMUNITY): Payer: Self-pay

## 2021-04-24 ENCOUNTER — Other Ambulatory Visit (HOSPITAL_COMMUNITY): Payer: Self-pay

## 2021-05-06 ENCOUNTER — Other Ambulatory Visit: Payer: Self-pay | Admitting: Psychiatry

## 2021-05-08 DIAGNOSIS — M0609 Rheumatoid arthritis without rheumatoid factor, multiple sites: Secondary | ICD-10-CM | POA: Diagnosis not present

## 2021-05-10 DIAGNOSIS — Z0000 Encounter for general adult medical examination without abnormal findings: Secondary | ICD-10-CM | POA: Diagnosis not present

## 2021-05-14 ENCOUNTER — Encounter: Payer: Self-pay | Admitting: Neurology

## 2021-05-14 ENCOUNTER — Ambulatory Visit: Payer: 59 | Admitting: Neurology

## 2021-05-14 ENCOUNTER — Other Ambulatory Visit (HOSPITAL_COMMUNITY): Payer: Self-pay

## 2021-05-14 ENCOUNTER — Encounter: Payer: Self-pay | Admitting: Adult Health

## 2021-05-14 VITALS — BP 129/79 | HR 95 | Ht 60.0 in | Wt 115.5 lb

## 2021-05-14 DIAGNOSIS — R2990 Unspecified symptoms and signs involving the nervous system: Secondary | ICD-10-CM

## 2021-05-14 DIAGNOSIS — G43009 Migraine without aura, not intractable, without status migrainosus: Secondary | ICD-10-CM | POA: Diagnosis not present

## 2021-05-14 MED ORDER — AMPHETAMINE-DEXTROAMPHETAMINE 20 MG PO TABS
ORAL_TABLET | ORAL | 0 refills | Status: DC
  Filled 2021-05-14 – 2021-05-17 (×2): qty 30, 30d supply, fill #0

## 2021-05-14 MED ORDER — LISDEXAMFETAMINE DIMESYLATE 70 MG PO CAPS
ORAL_CAPSULE | ORAL | 0 refills | Status: DC
  Filled 2021-05-14 – 2021-05-17 (×2): qty 30, 30d supply, fill #0

## 2021-05-14 MED ORDER — PROPRANOLOL HCL 20 MG PO TABS: 40.0000 mg | ORAL_TABLET | Freq: Two times a day (BID) | ORAL | 3 refills | Status: DC

## 2021-05-14 NOTE — Progress Notes (Signed)
[facility] [address] [place]. [place] [address] [phone]       OFFICE FOLLOW UP VISIT NOTE  [name] Date of Birth:  1970-09-17 Medical Record Number:  [id number]   Referring MD: Eliezer Champagne, [name] Reason for Referral: Stroke like episode HPI: Initial visit 10/03/2020  :[name] is a 50 year old Caucasian lady seen today for initial office consultation visit for strokelike episode.  History is obtained from the patient, review of electronic medical records and I personally reviewed available imaging films in PACS.  She has past medical history of hyperlipidemia, dyspepsia, irritable bowel syndrome, COVID-19 infection who presented on 08/05/2020 with sudden onset of right-sided weakness numbness right upper quadrant vision difficulties and spinning sensation.  His symptoms initially improved but she continued to have focal deficits since she was given IV TPA after discussion of risk benefit.  Her symptoms resolved after that.  She was kept in the neurological intensive care unit and blood pressure tightly controlled.  Subsequently in the CT angiogram and head and neck were obtained prior to TPA which were unremarkable.  Subsequently MRI scan of the brain was obtained which was normal.  2D echo showed ejection fraction of 55 to 60% without cardiac source of embolism.  LDL cholesterol was elevated at 1 1 7  mg percent and hemoglobin A1c was 4.8.  She is started on aspirin and diet controlled for lipids.  Patient states that she has done well since discharge she has had no recurrent episodes.  She has had a lot of muscle aches and pains and has been diagnosed with rheumatoid arthritis and is planning on starting Enbrel soon.  She continues to have some blurred vision and has seen an ophthalmologist was prescribed Eysuvis eyedrops which she is using.  She also feels easy fatigability and feels a lot of brain fog.  She has no significant vascular risk factors  except hyperlipidemia.  She is tolerating aspirin well without bruising or bleeding.  She has no new complaints.  She denies any prior history of migraine headaches similar episodes in the past. Update 05/14/21 : She returns for follow-up after last visit 7 months ago.  Patient was seen in the ER on 12/28/2020 with headaches and fleeting left-sided paresthesias.  CT head was negative for acute abnormality.  She was given headache cocktail and symptoms resolved.  Patient was diagnosed with COVID infection was the third time on September 6 and is recovering from that.  She continues to have migraine headaches which occur about 3-4 times a month.  She has been started on Inderal 30 mg twice daily for tachycardia which she has had post-COVID.  Her heart rate is still elevated at 95 today.  She is also been diagnosed with rheumatoid arthritis and started on Orencia monthly injections.  She complains of occasional pins-and-needles in the feet at night but these are not bothersome or frequent.  Patient does not want any work-up for peripheral neuropathy at this time.  She denies any difficulty with balance or gait weakness.  She remains on aspirin which is tolerating well without bruising or bleeding and Crestor which is tolerating without muscle aches and pains. ROS:   14 system review of systems is positive for numbness, weakness, blurred vision, room spinning, muscle aches and pains and all other systems negative  PMH:  Past Medical History:  Diagnosis Date   Allergic rhinitis    COVID-19    Dyspepsia    Hypercholesteremia    IBS (irritable bowel  syndrome)    Lumbosacral strain    RA (rheumatoid arthritis) (HCC)    Stroke (HCC) 08/05/2020   tia    Social History:  Social History   Socioeconomic History   Marital status: Married    Spouse name: Not on file   Number of children: 2   Years of education: Not on file   Highest education level: Not on file  Occupational History   Occupation: Charity fundraiser, Scientist, research (physical sciences): [employer]   Occupation: Charity fundraiser, BSN    Comment: [employer]  Tobacco Use   Smoking status: Never   Smokeless tobacco: Never   Tobacco comments:    exposed to second hand smoke  Vaping Use   Vaping Use: Never used  Substance and Sexual Activity   Alcohol use: Yes    Comment: social use   Drug use: No   Sexual activity: Not on file  Other Topics Concern   Not on file  Social History Narrative   Lives with 2 children and husband   Right Handed   Drinks 1-2 cups caffeine daily   Social Determinants of Health   Financial Resource Strain: Not on file  Food Insecurity: Not on file  Transportation Needs: Not on file  Physical Activity: Not on file  Stress: Not on file  Social Connections: Not on file  Intimate Partner Violence: Not on file    Medications:   Current Outpatient Medications on File Prior to Visit  Medication Sig Dispense Refill   abatacept (ORENCIA) 250 MG injection Use as directed Intravenous loading doses weeks 0,2 and 4 42 days 6 each 0   abatacept (ORENCIA) 250 MG injection Use as directed Intravenous 500 mg every 28 days 2 each 5   acetaminophen (TYLENOL) 500 MG tablet Take 500-1,000 mg by mouth every 6 (six) hours as needed.     albuterol (VENTOLIN HFA) 108 (90 Base) MCG/ACT inhaler Inhale 1-2 puffs into the lungs every 6 (six) hours as needed for wheezing or shortness of breath. 18 g 5   aspirin EC 81 MG EC tablet Take 1 tablet (81 mg total) by mouth daily. Swallow whole. 30 tablet 11   buPROPion (WELLBUTRIN XL) 150 MG 24 hr tablet Take 450 mg by mouth daily.     cycloSPORINE, PF, (CEQUA) 0.09 % SOLN Place 1 drop into both eyes 2 times a day 180 each 3   fluticasone (FLONASE) 50 MCG/ACT nasal spray Place 2 sprays into both nostrils daily.     fluticasone-salmeterol (ADVAIR HFA) 230-21 MCG/ACT inhaler Inhale 2 puffs into the lungs 2 (two) times daily. 1 Inhaler 12   gabapentin (NEURONTIN) 300 MG capsule Take 300-600 mg  by mouth 3 (three) times daily as needed.     Lifitegrast 5 % SOLN INSTILL 1 DROP BOTH EYES TWICE A DAY 180 each 4   Multiple Vitamin (MULTIVITAMIN) capsule Take 1 capsule by mouth at bedtime.     Omega-3 Fatty Acids (FISH OIL) 1000 MG CAPS Take 2 capsules by mouth daily.     predniSONE (DELTASONE) 5 MG tablet Take 10 mg by mouth daily.     rosuvastatin (CRESTOR) 10 MG tablet Take 10 mg by mouth daily.     spironolactone (ALDACTONE) 50 MG tablet Take 75 mg by mouth daily.     ibuprofen (ADVIL,MOTRIN) 200 MG tablet Take 200 mg by mouth every 6 (six) hours as needed for headache or moderate pain.     No current facility-administered medications on file  prior to visit.    Allergies:   Allergies  Allergen Reactions   Penicillins     rash    Physical Exam General: Pleasant well developed, well nourished middle-aged Caucasian lady, seated, in no evident distress Head: head normocephalic and atraumatic.   Neck: supple with no carotid or supraclavicular bruits Cardiovascular: regular rate and rhythm, no murmurs Musculoskeletal: no deformity Skin:  no rash/petichiae Vascular:  Normal pulses all extremities  Neurologic Exam Mental Status: Awake and fully alert. Oriented to place and time. Recent and remote memory intact. Attention span, concentration and fund of knowledge appropriate. Mood and affect appropriate.  Cranial Nerves: Fundoscopic exam reveals sharp disc margins. Pupils equal, briskly reactive to light. Extraocular movements full without nystagmus. Visual fields full to confrontation. Hearing intact. Facial sensation intact. Face, tongue, palate moves normally and symmetrically.  Motor: Normal bulk and tone. Normal strength in all tested extremity muscles. Sensory.: intact to touch , pinprick , position and vibratory sensation.  Coordination: Rapid alternating movements normal in all extremities. Finger-to-nose and heel-to-shin performed accurately bilaterally. Gait and Station:  Arises from chair without difficulty. Stance is normal. Gait demonstrates normal stride length and balance . Able to heel, toe and tandem walk without difficulty.  Reflexes: 1+ and symmetric. Toes downgoing.   NIHSS  0 Modified Rankin 0   ASSESSMENT: 50 year old Caucasian lady with transient episode of right-sided weakness numbness, spinning sensation and vision deficits nightly stroke like episode possibly complicated migraine treated with IV TPA with full recovery.  Brain imaging negative for acute stroke.  Vascular risk factors of hyperlipidemia only.  Recent episode of headache with fleeting left-sided paresthesias possibly complicated migraine episode in May 2022     PLAN: I had a long discussion with the patient with regards to her episode of headache with transient neurological dysfunction likely representing complicated migraines.  I recommend she increase the dose of Inderal to 40 mg twice daily for prophylaxis and to continue to use Tylenol for symptomatic relief and to avoid headache triggers.  She may also use gabapentin as needed for paresthesias in her feet.  Continue aspirin for stroke prevention and strict control of lipids with LDL cholesterol goal below 70 mg percent.  Check transcranial Doppler bubble study to look for PFO.  She will return for follow-up in the future in 3 months with my nurse practitioner Shanda Bumps or call earlier if necessary.  Greater than 50% time during this 25-minute   visit was spent on counseling and coordination of care about TIA and complicated migraine and strokelike episode discussion and answering questions. Delia Heady, MD  [facility] [address] [place], [place] [address]  Phone [phone] Fax [phone] Note: This document was prepared with digital dictation and possible smart phrase technology. Any transcriptional errors that result from this process are unintentional.

## 2021-05-14 NOTE — Patient Instructions (Signed)
I had a long discussion with the patient with regards to her episode of headache with transient neurological dysfunction likely representing complicated migraines.  I recommend she increase the dose of Inderal to 40 mg twice daily for prophylaxis and to continue to use Tylenol for symptomatic relief and to avoid headache triggers.  She may also use gabapentin as needed for paresthesias in her feet.  Continue aspirin for stroke prevention and strict control of lipids with LDL cholesterol goal below 70 mg percent.  Check transcranial Doppler bubble study to look for PFO.  She will return for follow-up in the future in 3 months with my nurse practitioner Shanda Bumps or call earlier if necessary. Migraine Headache A migraine headache is a very strong throbbing pain on one side or both sides of your head. This type of headache can also cause other symptoms. It can last from 4 hours to 3 days. Talk with your doctor about what things may bring on (trigger) this condition. What are the causes? The exact cause of this condition is not known. This condition may be triggered or caused by: Drinking alcohol. Smoking. Taking medicines, such as: Medicine used to treat chest pain (nitroglycerin). Birth control pills. Estrogen. Some blood pressure medicines. Eating or drinking certain products. Doing physical activity. Other things that may trigger a migraine headache include: Having a menstrual period. Pregnancy. Hunger. Stress. Not getting enough sleep or getting too much sleep. Weather changes. Tiredness (fatigue). What increases the risk? Being 21-2 years old. Being female. Having a family history of migraine headaches. Being Caucasian. Having depression or anxiety. Being very overweight. What are the signs or symptoms? A throbbing pain. This pain may: Happen in any area of the head, such as on one side or both sides. Make it hard to do daily activities. Get worse with physical activity. Get worse  around bright lights or loud noises. Other symptoms may include: Feeling sick to your stomach (nauseous). Vomiting. Dizziness. Being sensitive to bright lights, loud noises, or smells. Before you get a migraine headache, you may get warning signs (an aura). An aura may include: Seeing flashing lights or having blind spots. Seeing bright spots, halos, or zigzag lines. Having tunnel vision or blurred vision. Having numbness or a tingling feeling. Having trouble talking. Having weak muscles. Some people have symptoms after a migraine headache (postdromal phase), such as: Tiredness. Trouble thinking (concentrating). How is this treated? Taking medicines that: Relieve pain. Relieve the feeling of being sick to your stomach. Prevent migraine headaches. Treatment may also include: Having acupuncture. Avoiding foods that bring on migraine headaches. Learning ways to control your body functions (biofeedback). Therapy to help you know and deal with negative thoughts (cognitive behavioral therapy). Follow these instructions at home: Medicines Take over-the-counter and prescription medicines only as told by your doctor. Ask your doctor if the medicine prescribed to you: Requires you to avoid driving or using heavy machinery. Can cause trouble pooping (constipation). You may need to take these steps to prevent or treat trouble pooping: Drink enough fluid to keep your pee (urine) pale yellow. Take over-the-counter or prescription medicines. Eat foods that are high in fiber. These include beans, whole grains, and fresh fruits and vegetables. Limit foods that are high in fat and sugar. These include fried or sweet foods. Lifestyle Do not drink alcohol. Do not use any products that contain nicotine or tobacco, such as cigarettes, e-cigarettes, and chewing tobacco. If you need help quitting, ask your doctor. Get at least 8 hours of sleep  every night. Limit and deal with stress. General  instructions   Keep a journal to find out what may bring on your migraine headaches. For example, write down: What you eat and drink. How much sleep you get. Any change in what you eat or drink. Any change in your medicines. If you have a migraine headache: Avoid things that make your symptoms worse, such as bright lights. It may help to lie down in a dark, quiet room. Do not drive or use heavy machinery. Ask your doctor what activities are safe for you. Keep all follow-up visits as told by your doctor. This is important. Contact a doctor if: You get a migraine headache that is different or worse than others you have had. You have more than 15 headache days in one month. Get help right away if: Your migraine headache gets very bad. Your migraine headache lasts longer than 72 hours. You have a fever. You have a stiff neck. You have trouble seeing. Your muscles feel weak or like you cannot control them. You start to lose your balance a lot. You start to have trouble walking. You pass out (faint). You have a seizure. Summary A migraine headache is a very strong throbbing pain on one side or both sides of your head. These headaches can also cause other symptoms. This condition may be treated with medicines and changes to your lifestyle. Keep a journal to find out what may bring on your migraine headaches. Contact a doctor if you get a migraine headache that is different or worse than others you have had. Contact your doctor if you have more than 15 headache days in a month. This information is not intended to replace advice given to you by your health care provider. Make sure you discuss any questions you have with your health care provider. Document Revised: 12/03/2018 Document Reviewed: 09/23/2018 Elsevier Patient Education  2022 [employer].

## 2021-05-15 ENCOUNTER — Other Ambulatory Visit (HOSPITAL_COMMUNITY): Payer: Self-pay

## 2021-05-16 ENCOUNTER — Other Ambulatory Visit (HOSPITAL_COMMUNITY): Payer: Self-pay

## 2021-05-17 ENCOUNTER — Other Ambulatory Visit (HOSPITAL_COMMUNITY): Payer: Self-pay

## 2021-05-17 DIAGNOSIS — D72829 Elevated white blood cell count, unspecified: Secondary | ICD-10-CM | POA: Diagnosis not present

## 2021-05-17 DIAGNOSIS — Z0000 Encounter for general adult medical examination without abnormal findings: Secondary | ICD-10-CM | POA: Diagnosis not present

## 2021-05-20 ENCOUNTER — Telehealth: Payer: Self-pay | Admitting: Neurology

## 2021-05-20 NOTE — Telephone Encounter (Signed)
[facility] auth: NPR ref # [id number]. Set Natalie Ford a message, she will reach out to the patient to schedule.

## 2021-05-21 ENCOUNTER — Other Ambulatory Visit (HOSPITAL_COMMUNITY): Payer: Self-pay

## 2021-05-21 DIAGNOSIS — D72829 Elevated white blood cell count, unspecified: Secondary | ICD-10-CM | POA: Diagnosis not present

## 2021-05-21 DIAGNOSIS — M8580 Other specified disorders of bone density and structure, unspecified site: Secondary | ICD-10-CM | POA: Diagnosis not present

## 2021-05-21 DIAGNOSIS — Z79899 Other long term (current) drug therapy: Secondary | ICD-10-CM | POA: Diagnosis not present

## 2021-05-22 ENCOUNTER — Telehealth: Payer: Self-pay | Admitting: Hematology and Oncology

## 2021-05-22 DIAGNOSIS — M0609 Rheumatoid arthritis without rheumatoid factor, multiple sites: Secondary | ICD-10-CM | POA: Diagnosis not present

## 2021-05-22 NOTE — Telephone Encounter (Signed)
Scheduled appt per 9/28 referral. Pt is aware of appt date and time.  

## 2021-05-23 ENCOUNTER — Telehealth: Payer: Self-pay | Admitting: Hematology and Oncology

## 2021-05-23 NOTE — Telephone Encounter (Signed)
Rescheduled 09/30 appointment to 10/06 per providers request due to weather, patient has been called and notified.

## 2021-05-24 ENCOUNTER — Encounter: Payer: 59 | Admitting: Hematology and Oncology

## 2021-05-24 ENCOUNTER — Other Ambulatory Visit: Payer: 59

## 2021-05-30 ENCOUNTER — Inpatient Hospital Stay: Payer: 59 | Attending: Hematology and Oncology | Admitting: Hematology and Oncology

## 2021-05-30 ENCOUNTER — Inpatient Hospital Stay: Payer: 59

## 2021-05-30 ENCOUNTER — Other Ambulatory Visit: Payer: Self-pay

## 2021-05-30 VITALS — BP 111/82 | HR 91 | Temp 99.5°F | Resp 18 | Ht 60.0 in | Wt 118.7 lb

## 2021-05-30 DIAGNOSIS — Z8673 Personal history of transient ischemic attack (TIA), and cerebral infarction without residual deficits: Secondary | ICD-10-CM | POA: Diagnosis not present

## 2021-05-30 DIAGNOSIS — M069 Rheumatoid arthritis, unspecified: Secondary | ICD-10-CM | POA: Diagnosis not present

## 2021-05-30 DIAGNOSIS — Z800 Family history of malignant neoplasm of digestive organs: Secondary | ICD-10-CM

## 2021-05-30 DIAGNOSIS — Z806 Family history of leukemia: Secondary | ICD-10-CM | POA: Diagnosis not present

## 2021-05-30 DIAGNOSIS — D72829 Elevated white blood cell count, unspecified: Secondary | ICD-10-CM | POA: Diagnosis not present

## 2021-05-30 DIAGNOSIS — D72825 Bandemia: Secondary | ICD-10-CM

## 2021-05-30 DIAGNOSIS — Z8616 Personal history of COVID-19: Secondary | ICD-10-CM | POA: Diagnosis not present

## 2021-05-30 LAB — CMP (CANCER CENTER ONLY)
ALT: 18 U/L (ref 0–44)
AST: 16 U/L (ref 15–41)
Albumin: 4.2 g/dL (ref 3.5–5.0)
Alkaline Phosphatase: 39 U/L (ref 38–126)
Anion gap: 7 (ref 5–15)
BUN: 17 mg/dL (ref 6–20)
CO2: 27 mmol/L (ref 22–32)
Calcium: 9.4 mg/dL (ref 8.9–10.3)
Chloride: 105 mmol/L (ref 98–111)
Creatinine: 0.92 mg/dL (ref 0.44–1.00)
GFR, Estimated: >60 mL/min (ref >60)
Glucose, Bld: 117 mg/dL — ABNORMAL HIGH (ref 70–99)
Potassium: 4.4 mmol/L (ref 3.5–5.1)
Sodium: 139 mmol/L (ref 135–145)
Total Bilirubin: 0.4 mg/dL (ref 0.3–1.2)
Total Protein: 6.7 g/dL (ref 6.5–8.1)

## 2021-05-30 LAB — CBC WITH DIFFERENTIAL (CANCER CENTER ONLY)
Abs Immature Granulocytes: 0.07 10*3/uL (ref 0.00–0.07)
Basophils Absolute: 0 10*3/uL (ref 0.0–0.1)
Basophils Relative: 0 %
Eosinophils Absolute: 0 10*3/uL (ref 0.0–0.5)
Eosinophils Relative: 0 %
HCT: 40.2 % (ref 36.0–46.0)
Hemoglobin: 13.5 g/dL (ref 12.0–15.0)
Immature Granulocytes: 1 %
Lymphocytes Relative: 9 %
Lymphs Abs: 1.2 10*3/uL (ref 0.7–4.0)
MCH: 33.3 pg (ref 26.0–34.0)
MCHC: 33.6 g/dL (ref 30.0–36.0)
MCV: 99 fL (ref 80.0–100.0)
Monocytes Absolute: 0.6 10*3/uL (ref 0.1–1.0)
Monocytes Relative: 4 %
Neutro Abs: 12.5 10*3/uL — ABNORMAL HIGH (ref 1.7–7.7)
Neutrophils Relative %: 86 %
Platelet Count: 266 10*3/uL (ref 150–400)
RBC: 4.06 MIL/uL (ref 3.87–5.11)
RDW: 11.9 % (ref 11.5–15.5)
WBC Count: 14.4 10*3/uL — ABNORMAL HIGH (ref 4.0–10.5)
nRBC: 0 % (ref 0.0–0.2)

## 2021-05-30 LAB — SAVE SMEAR(SSMR), FOR PROVIDER SLIDE REVIEW

## 2021-05-30 LAB — LACTATE DEHYDROGENASE: LDH: 201 U/L — ABNORMAL HIGH (ref 98–192)

## 2021-05-30 LAB — SEDIMENTATION RATE: Sed Rate: 1 mm/hr (ref 0–22)

## 2021-05-30 LAB — C-REACTIVE PROTEIN: CRP: 0.6 mg/dL (ref <1.0)

## 2021-05-30 NOTE — Progress Notes (Signed)
[place] [phone]   [phone] [phone] NOTE  Patient Care Team: Deland Pretty, MD as PCP - General (Internal Medicine)  Hematological/Oncological History # Leukocytosis, Neutrophilia 12/28/2020: WBC 11.0, Hgb 14.6, MCV 98.6, Plt 310, ANC 9100 02/05/2021: WBC 19.1, Hgb 14.2, MCV 97.2, Plt 253, ANC 16,900 05/10/2021: WBC 22.2, Hgb 14.2, MCV 98.1, Plt 321, ANC 19,300 05/30/2021: establish care with Dr. Lorenso Courier   CHIEF COMPLAINTS/PURPOSE OF CONSULTATION:  " Leukocytosis "  HISTORY OF PRESENTING ILLNESS:  Natalie Ford 50 y.o. female with medical history significant for RA, IBS, stroke, and hypercholesteremia who presents for evaluation of leukocytosis.   On review of the previous records Natalie Ford was noted to have leukocytosis on 12/28/2020.  She was noted to have a white blood cell count of 11.0.  Most recently on 02/02/2021 patient had a white blood cell count of 19.1 with an [place] of 16,900.  Most recently on 04/30/2021 patient had a white blood cell count of 22.2 with an ANC of 19,300.  Due to concerns for these findings the patient was referred to hematology for further evaluation management.  On exam today Natalie Ford reports that she has had a very difficult year.  She reports that she has had COVID 3 times and that prior to [place] she was diagnosed with rheumatoid arthritis, had a stroke in December 2021, and developed asthma.  She notes that her hair has been coming out and the last time she had COVID was Labor Day of this year.  She notes that she still has trouble with taste but no other residual symptoms.  Originally she was diagnosed with rheumatoid arthritis in March 2020 and was diagnosed with COVID at that time as well.  She also had COVID in January 2021 at which time PFTs showed her findings consistent with asthma.  She notes that over the last 2 years she has been on steroids, at least 10 mg p.o.  She is currently on 15 mg.   She notes that her weight has been steady and she does have terrible headaches.  On further discussion she reports that her mother is deceased and that her father died of leukemia.  She has 2 children, a son who is in college and a healthy daughter.  She reports that she is a never smoker but does occasionally drink alcohol, typically 3-5 times per month.  She currently works as a Marine scientist.  She denies having any fevers, chills, sweats, nausea, vomiting or diarrhea.  A full 10 point ROS is listed below.  MEDICAL HISTORY:  Past Medical History:  Diagnosis Date   Allergic rhinitis    COVID-19    Dyspepsia    Hypercholesteremia    IBS (irritable bowel syndrome)    Lumbosacral strain    RA (rheumatoid arthritis) ([place])    Stroke ([place]) 08/05/2020   tia    SURGICAL HISTORY: Past Surgical History:  Procedure Laterality Date   CARPAL TUNNEL RELEASE Right    CESAREAN SECTION     x 2    SOCIAL HISTORY: Social History   Socioeconomic History   Marital status: Married    Spouse name: Not on file   Number of children: 2   Years of education: Not on file   Highest education level: Not on file  Occupational History   Occupation: Therapist, sports, Multimedia programmer: [employer]   Occupation: Therapist, sports, BSN    Comment: [employer]  Tobacco Use  Smoking status: Never   Smokeless tobacco: Never   Tobacco comments:    exposed to second hand smoke  Vaping Use   Vaping Use: Never used  Substance and Sexual Activity   Alcohol use: Yes    Comment: social use   Drug use: No   Sexual activity: Not on file  Other Topics Concern   Not on file  Social History Narrative   Lives with 2 children and husband   Right Handed   Drinks 1-2 cups caffeine daily   Social Determinants of Health   Financial Resource Strain: Not on file  Food Insecurity: Not on file  Transportation Needs: Not on file  Physical Activity: Not on file  Stress: Not on file  Social Connections: Not on file   Intimate Partner Violence: Not on file    FAMILY HISTORY: Family History  Problem Relation Age of Onset   Diabetes Mother    Heart disease Mother    Stroke Mother    Diabetes Sister    Leukemia Father    Stomach cancer Paternal Grandfather    Kidney disease Paternal Aunt     ALLERGIES:  is allergic to penicillins.  MEDICATIONS:  Current Outpatient Medications  Medication Sig Dispense Refill   abatacept (ORENCIA) 250 MG injection Use as directed Intravenous loading doses weeks 0,2 and 4 42 days 6 each 0   abatacept (ORENCIA) 250 MG injection Use as directed Intravenous 500 mg every 28 days 2 each 5   acetaminophen (TYLENOL) 500 MG tablet Take 500-1,000 mg by mouth every 6 (six) hours as needed.     albuterol (VENTOLIN HFA) 108 (90 Base) MCG/ACT inhaler Inhale 1-2 puffs into the lungs every 6 (six) hours as needed for wheezing or shortness of breath. 18 g 5   amphetamine-dextroamphetamine (ADDERALL) 20 MG tablet Take 1 tablet by mouth in the morning 30 tablet 0   aspirin EC 81 MG EC tablet Take 1 tablet (81 mg total) by mouth daily. Swallow whole. 30 tablet 11   buPROPion (WELLBUTRIN XL) 150 MG 24 hr tablet 3 tablets     cycloSPORINE, PF, (CEQUA) 0.09 % SOLN Place 1 drop into both eyes 2 times a day 180 each 3   fluticasone (FLONASE) 50 MCG/ACT nasal spray Place 2 sprays into both nostrils daily.     fluticasone-salmeterol (ADVAIR HFA) 230-21 MCG/ACT inhaler Inhale 2 puffs into the lungs 2 (two) times daily. 1 Inhaler 12   gabapentin (NEURONTIN) 300 MG capsule Take 300-600 mg by mouth 3 (three) times daily as needed.     ibuprofen (ADVIL,MOTRIN) 200 MG tablet Take 200 mg by mouth every 6 (six) hours as needed for headache or moderate pain.     lisdexamfetamine (VYVANSE) 70 MG capsule Take 1 capsule by mouth daily 30 capsule 0   Multiple Vitamin (MULTIVITAMIN) capsule Take 1 capsule by mouth at bedtime.     Omega-3 Fatty Acids (FISH OIL) 1000 MG CAPS Take 2 capsules by mouth daily.      predniSONE (DELTASONE) 5 MG tablet Take 15 mg by mouth daily.     propranolol (INDERAL) 20 MG tablet Take 2 tablets (40 mg total) by mouth 2 (two) times daily. 90 tablet 3   rosuvastatin (CRESTOR) 10 MG tablet Take 10 mg by mouth daily.     spironolactone (ALDACTONE) 50 MG tablet Take 75 mg by mouth daily.     No current facility-administered medications for this visit.    REVIEW OF SYSTEMS:   Constitutional: ( - )  fevers, ( - )  chills , ( - ) night sweats Eyes: ( - ) blurriness of vision, ( - ) double vision, ( - ) watery eyes Ears, nose, mouth, throat, and face: ( - ) mucositis, ( - ) sore throat Respiratory: ( - ) cough, ( - ) dyspnea, ( - ) wheezes Cardiovascular: ( - ) palpitation, ( - ) chest discomfort, ( - ) lower extremity swelling Gastrointestinal:  ( - ) nausea, ( - ) heartburn, ( - ) change in bowel habits Skin: ( - ) abnormal skin rashes Lymphatics: ( - ) new lymphadenopathy, ( - ) easy bruising Neurological: ( - ) numbness, ( - ) tingling, ( - ) new weaknesses Behavioral/Psych: ( - ) mood change, ( - ) new changes  All other systems were reviewed with the patient and are negative.  PHYSICAL EXAMINATION: ECOG PERFORMANCE STATUS: 1 - Symptomatic but completely ambulatory  Vitals:   05/30/21 1320  BP: 111/82  Pulse: 91  Resp: 18  Temp: 99.5 F (37.5 C)  SpO2: 99%   Filed Weights   05/30/21 1320  Weight: 118 lb 11.2 oz (53.8 kg)    GENERAL: well appearing middle-aged Caucasian female in NAD  SKIN: skin color, texture, turgor are normal, no rashes or significant lesions EYES: conjunctiva are pink and non-injected, sclera clear LUNGS: clear to auscultation and percussion with normal breathing effort HEART: regular rate & rhythm and no murmurs and no lower extremity edema Musculoskeletal: no cyanosis of digits and no clubbing  PSYCH: alert & oriented x 3, fluent speech NEURO: no focal motor/sensory deficits  LABORATORY DATA:  I have reviewed the data as  listed CBC Latest Ref Rng & Units 05/30/2021 12/28/2020 08/05/2020  WBC 4.0 - 10.5 K/uL 14.4(H) 11.0(H) 9.9  Hemoglobin 12.0 - 15.0 g/dL 13.5 14.6 13.7  Hematocrit 36.0 - 46.0 % 40.2 42.3 40.0  Platelets 150 - 400 K/uL 266 310 291    CMP Latest Ref Rng & Units 05/30/2021 12/28/2020 08/05/2020  Glucose 70 - 99 mg/dL 117(H) 127(H) 90  BUN 6 - 20 mg/dL '17 12 10  ' Creatinine 0.44 - 1.00 mg/dL 0.92 1.07(H) 0.87  Sodium 135 - 145 mmol/L 139 138 136  Potassium 3.5 - 5.1 mmol/L 4.4 3.8 3.7  Chloride 98 - 111 mmol/L 105 100 102  CO2 22 - 32 mmol/L '27 28 24  ' Calcium 8.9 - 10.3 mg/dL 9.4 9.4 9.0  Total Protein 6.5 - 8.1 g/dL 6.7 - 6.9  Total Bilirubin 0.3 - 1.2 mg/dL 0.4 - 0.7  Alkaline Phos 38 - 126 U/L 39 - 33(L)  AST 15 - 41 U/L 16 - 21  ALT 0 - 44 U/L 18 - 19    RADIOGRAPHIC STUDIES: VAS [place] TRANSCRANIAL DOPPLER W BUBBLES  Result Date: 06/04/2021  Transcranial Doppler with Bubble Patient Name:  Natalie Ford [facility]  Date of Exam:   05/31/2021 Medical Rec #: [id number]               Accession #:    [id number] Date of Birth: 20-Aug-1971               Patient Gender: F Patient Age:   33 years Exam Location:  [facility] Procedure:      VAS [place] TRANSCRANIAL DOPPLER W BUBBLES Referring Phys: PRAMOD SETHI --------------------------------------------------------------------------------  Indications: Stroke like symptoms & atypical migraine. Comparison Study: No previous exams Performing Technologist: Jody Hill RVT, RDMS  Examination Guidelines: A complete evaluation includes B-mode imaging, spectral Doppler,  color Doppler, and power Doppler as needed of all accessible portions of each vessel. Bilateral testing is considered an integral part of a complete examination. Limited examinations for reoccurring indications may be performed as noted.  Summary: No HITS at rest or during Valsalva. Negative transcranial Doppler Bubble study with no evidence of right to left intracardiac communication.  A vascular  evaluation was performed. The left middle cerebral artery was studied. An IV was inserted into the patient's left Ac fossa. Verbal informed consent was obtained.  Negative TCD bubble study *See table(s) above for TCD measurements and observations.  Diagnosing physician: Antony Contras MD Electronically signed by Antony Contras MD on 06/04/2021 at 12:52:30 PM.    Final     ASSESSMENT & PLAN Natalie Ford 50 y.o. female with medical history significant for RA, IBS, stroke, and hypercholesteremia who presents for evaluation of leukocytosis.   After review of the labs, review of the records, and discussion with the patient the patients findings are most consistent with leukocytosis secondary to inflammation versus steroid use.  The patient is on at least 15 mg p.o. steroids which could easily bump her white count up to 14.4, particular if it is chronically administered.  She did not have any infectious symptoms and no other signs that would be concerning for a myeloproliferative neoplasm.  As such we will order inflammatory markers and hold on an MPN work-up unless she were to have persistent leukocytosis in the absence of steroid therapy.  #Leukocytosis, Neutrophilia --findings are consistent with a leukocytosis of unclear etiology.  Leukocytosis is of neutrophilic predominance --We will order inflammatory markers with ESR and CRP --Patient is on prednisone therapy, currently 66m PO prednisone --Return to clinic pending the results of the above studies.  Orders Placed This Encounter  Procedures   CBC with Differential [place])    Standing Status:   Future    Number of Occurrences:   1    Standing Expiration Date:   05/30/2022   CMP ([place])    Standing Status:   Future    Number of Occurrences:   1    Standing Expiration Date:   05/30/2022   Lactate dehydrogenase (LDH)    Standing Status:   Future    Number of Occurrences:   1    Standing Expiration Date:    05/30/2022   Sedimentation rate    Standing Status:   Future    Number of Occurrences:   1    Standing Expiration Date:   05/30/2022   C-reactive protein    Standing Status:   Future    Number of Occurrences:   1    Standing Expiration Date:   05/30/2022   Save Smear (SSMR)    Standing Status:   Future    Number of Occurrences:   1    Standing Expiration Date:   05/30/2022    All questions were answered. The patient knows to call the clinic with any problems, questions or concerns.  A total of more than 60 minutes were spent on this encounter with face-to-face time and non-face-to-face time, including preparing to see the patient, ordering tests and/or medications, counseling the patient and coordination of care as outlined above.   JLedell Peoples MD Department of Hematology/Oncology [facility]Phone: [phone]: [phone]: [email or web address]  06/09/2021 5:06 PM

## 2021-05-31 ENCOUNTER — Ambulatory Visit (HOSPITAL_COMMUNITY)
Admission: RE | Admit: 2021-05-31 | Discharge: 2021-05-31 | Disposition: A | Discharge status: Home / Self Care | Payer: 59 | Source: Ambulatory Visit | Attending: Neurology | Admitting: Neurology

## 2021-05-31 DIAGNOSIS — G43009 Migraine without aura, not intractable, without status migrainosus: Secondary | ICD-10-CM | POA: Diagnosis not present

## 2021-05-31 DIAGNOSIS — R2990 Unspecified symptoms and signs involving the nervous system: Secondary | ICD-10-CM | POA: Diagnosis not present

## 2021-06-05 DIAGNOSIS — E669 Obesity, unspecified: Secondary | ICD-10-CM | POA: Diagnosis not present

## 2021-06-05 DIAGNOSIS — Z1231 Encounter for screening mammogram for malignant neoplasm of breast: Secondary | ICD-10-CM | POA: Diagnosis not present

## 2021-06-05 DIAGNOSIS — Z01419 Encounter for gynecological examination (general) (routine) without abnormal findings: Secondary | ICD-10-CM | POA: Diagnosis not present

## 2021-06-05 DIAGNOSIS — Z130 Encounter for screening for diseases of the blood and blood-forming organs and certain disorders involving the immune mechanism: Secondary | ICD-10-CM | POA: Diagnosis not present

## 2021-06-05 DIAGNOSIS — Z1389 Encounter for screening for other disorder: Secondary | ICD-10-CM | POA: Diagnosis not present

## 2021-06-05 DIAGNOSIS — E041 Nontoxic single thyroid nodule: Secondary | ICD-10-CM | POA: Diagnosis not present

## 2021-06-11 ENCOUNTER — Other Ambulatory Visit (HOSPITAL_COMMUNITY): Payer: Self-pay

## 2021-06-11 MED ORDER — LISDEXAMFETAMINE DIMESYLATE 70 MG PO CAPS
ORAL_CAPSULE | ORAL | 0 refills | Status: DC
  Filled 2021-06-18: qty 30, 30d supply, fill #0

## 2021-06-11 MED ORDER — AMPHETAMINE-DEXTROAMPHETAMINE 20 MG PO TABS
ORAL_TABLET | ORAL | 0 refills | Status: DC
  Filled 2021-06-18: qty 30, 30d supply, fill #0

## 2021-06-13 ENCOUNTER — Other Ambulatory Visit: Payer: Self-pay | Admitting: Neurology

## 2021-06-13 ENCOUNTER — Telehealth: Payer: Self-pay | Admitting: *Deleted

## 2021-06-13 DIAGNOSIS — H04123 Dry eye syndrome of bilateral lacrimal glands: Secondary | ICD-10-CM | POA: Diagnosis not present

## 2021-06-13 DIAGNOSIS — H16223 Keratoconjunctivitis sicca, not specified as Sjogren's, bilateral: Secondary | ICD-10-CM | POA: Diagnosis not present

## 2021-06-13 MED ORDER — BUTALBITAL-APAP-CAFFEINE 50-325-40 MG PO TABS: 1.0000 | ORAL_TABLET | Freq: Four times a day (QID) | ORAL | 0 refills | Status: DC | PRN

## 2021-06-13 NOTE — Telephone Encounter (Signed)
I called the patient and notified her of Dr. Marlis Edelson plan. She was in agreement and appreciated the prescription.

## 2021-06-13 NOTE — Telephone Encounter (Signed)
Mychart message from patient:  I just wanted to see if I could get something acutely for my migraines. They have been so intense and nothing is helping. I have increased my propranolol up to 100 mg daily, I've not noticed anything yet. I know I can increase it up to 120 mg so I plan on doing that and monitoring my b/p. I just would like something for acute pain. I've had pain for past 3 days, I wake up with it and try to sleep with it, it won't ease up.   Thank you  Natalie Ford

## 2021-06-13 NOTE — Telephone Encounter (Signed)
See phone note - called patient.

## 2021-06-17 ENCOUNTER — Other Ambulatory Visit (HOSPITAL_COMMUNITY): Payer: Self-pay

## 2021-06-18 ENCOUNTER — Other Ambulatory Visit (HOSPITAL_COMMUNITY): Payer: Self-pay

## 2021-06-19 ENCOUNTER — Encounter: Payer: Self-pay | Admitting: Physical Medicine and Rehabilitation

## 2021-06-19 DIAGNOSIS — M0609 Rheumatoid arthritis without rheumatoid factor, multiple sites: Secondary | ICD-10-CM | POA: Diagnosis not present

## 2021-06-19 DIAGNOSIS — U099 Post covid-19 condition, unspecified: Secondary | ICD-10-CM | POA: Diagnosis not present

## 2021-06-20 DIAGNOSIS — M0609 Rheumatoid arthritis without rheumatoid factor, multiple sites: Secondary | ICD-10-CM | POA: Diagnosis not present

## 2021-06-20 DIAGNOSIS — U099 Post covid-19 condition, unspecified: Secondary | ICD-10-CM | POA: Diagnosis not present

## 2021-06-20 DIAGNOSIS — R5382 Chronic fatigue, unspecified: Secondary | ICD-10-CM | POA: Diagnosis not present

## 2021-06-20 DIAGNOSIS — Z6821 Body mass index (BMI) 21.0-21.9, adult: Secondary | ICD-10-CM | POA: Diagnosis not present

## 2021-06-20 DIAGNOSIS — E041 Nontoxic single thyroid nodule: Secondary | ICD-10-CM | POA: Diagnosis not present

## 2021-06-20 DIAGNOSIS — L659 Nonscarring hair loss, unspecified: Secondary | ICD-10-CM | POA: Diagnosis not present

## 2021-06-20 DIAGNOSIS — J45909 Unspecified asthma, uncomplicated: Secondary | ICD-10-CM | POA: Diagnosis not present

## 2021-06-21 ENCOUNTER — Telehealth: Payer: Self-pay | Admitting: *Deleted

## 2021-06-21 NOTE — Telephone Encounter (Signed)
-----   Message from Jaci Standard, MD sent at 06/20/2021 12:04 PM EDT ----- Please let Natalie Ford know that her labwork is most consistent with leukocytosis secondary to steroid use. The elevated WBC should not cause any issues. If the WBC get higher or if stopping steroids in the future does not decrease the WBC we would be happy to see her back. Otherwise no need for routine f/u.   ----- Message ----- From: Interface, Lab In [place] Sent: 05/30/2021   2:25 PM EDT To: Jaci Standard, MD

## 2021-06-21 NOTE — Telephone Encounter (Signed)
TCT patient regarding recent lab results. Spoke with her and advised that her elevated WBC's are likely related to her steroids. This elevation should not cause any issues. If her WBCs get higher or don't come down with stopping the steroids , then we can see her back here. Otherwise she does not need to follow up in this clinic.  She voiced understanding.

## 2021-06-28 ENCOUNTER — Other Ambulatory Visit (HOSPITAL_COMMUNITY): Payer: Self-pay

## 2021-07-01 DIAGNOSIS — H04123 Dry eye syndrome of bilateral lacrimal glands: Secondary | ICD-10-CM | POA: Diagnosis not present

## 2021-07-01 DIAGNOSIS — J4540 Moderate persistent asthma, uncomplicated: Secondary | ICD-10-CM | POA: Diagnosis not present

## 2021-07-01 DIAGNOSIS — H1045 Other chronic allergic conjunctivitis: Secondary | ICD-10-CM | POA: Diagnosis not present

## 2021-07-01 DIAGNOSIS — J3089 Other allergic rhinitis: Secondary | ICD-10-CM | POA: Diagnosis not present

## 2021-07-09 ENCOUNTER — Other Ambulatory Visit (HOSPITAL_COMMUNITY): Payer: Self-pay

## 2021-07-11 ENCOUNTER — Other Ambulatory Visit (HOSPITAL_COMMUNITY): Payer: Self-pay

## 2021-07-15 ENCOUNTER — Other Ambulatory Visit (HOSPITAL_COMMUNITY): Payer: Self-pay

## 2021-07-17 DIAGNOSIS — M0609 Rheumatoid arthritis without rheumatoid factor, multiple sites: Secondary | ICD-10-CM | POA: Diagnosis not present

## 2021-07-23 DIAGNOSIS — K589 Irritable bowel syndrome without diarrhea: Secondary | ICD-10-CM | POA: Diagnosis not present

## 2021-07-23 DIAGNOSIS — J45909 Unspecified asthma, uncomplicated: Secondary | ICD-10-CM | POA: Diagnosis not present

## 2021-07-23 DIAGNOSIS — Z79899 Other long term (current) drug therapy: Secondary | ICD-10-CM | POA: Diagnosis not present

## 2021-07-23 DIAGNOSIS — M2550 Pain in unspecified joint: Secondary | ICD-10-CM | POA: Diagnosis not present

## 2021-07-23 DIAGNOSIS — H04123 Dry eye syndrome of bilateral lacrimal glands: Secondary | ICD-10-CM | POA: Diagnosis not present

## 2021-07-23 DIAGNOSIS — M0609 Rheumatoid arthritis without rheumatoid factor, multiple sites: Secondary | ICD-10-CM | POA: Diagnosis not present

## 2021-07-23 DIAGNOSIS — R5383 Other fatigue: Secondary | ICD-10-CM | POA: Diagnosis not present

## 2021-07-23 DIAGNOSIS — M791 Myalgia, unspecified site: Secondary | ICD-10-CM | POA: Diagnosis not present

## 2021-07-23 DIAGNOSIS — U099 Post covid-19 condition, unspecified: Secondary | ICD-10-CM | POA: Diagnosis not present

## 2021-07-26 ENCOUNTER — Other Ambulatory Visit (HOSPITAL_COMMUNITY): Payer: Self-pay

## 2021-07-26 MED ORDER — AMPHETAMINE-DEXTROAMPHETAMINE 20 MG PO TABS
ORAL_TABLET | ORAL | 0 refills | Status: DC
  Filled 2021-07-26: qty 30, 30d supply, fill #0

## 2021-07-26 MED ORDER — LISDEXAMFETAMINE DIMESYLATE 70 MG PO CAPS
ORAL_CAPSULE | ORAL | 0 refills | Status: DC
  Filled 2021-07-26: qty 30, 30d supply, fill #0

## 2021-08-06 ENCOUNTER — Other Ambulatory Visit (HOSPITAL_COMMUNITY): Payer: Self-pay

## 2021-08-13 ENCOUNTER — Other Ambulatory Visit (HOSPITAL_COMMUNITY): Payer: Self-pay

## 2021-08-13 ENCOUNTER — Encounter: Payer: Self-pay | Admitting: Adult Health

## 2021-08-13 ENCOUNTER — Ambulatory Visit: Payer: 59 | Admitting: Adult Health

## 2021-08-13 VITALS — BP 115/77 | HR 73 | Ht 60.0 in | Wt 120.6 lb

## 2021-08-13 DIAGNOSIS — G43009 Migraine without aura, not intractable, without status migrainosus: Secondary | ICD-10-CM | POA: Diagnosis not present

## 2021-08-13 DIAGNOSIS — R2990 Unspecified symptoms and signs involving the nervous system: Secondary | ICD-10-CM | POA: Diagnosis not present

## 2021-08-13 MED ORDER — NURTEC 75 MG PO TBDP: 75.0000 mg | ORAL_TABLET | Freq: Every day | ORAL | 0 refills | Status: DC | PRN

## 2021-08-13 MED ORDER — TIZANIDINE HCL 2 MG PO TABS: 2.0000 mg | ORAL_TABLET | Freq: Every day | ORAL | 5 refills | Status: DC

## 2021-08-13 NOTE — Progress Notes (Signed)
[facility] [address] [place]. [place] [address] [phone]       OFFICE FOLLOW UP VISIT NOTE  [name] Date of Birth:  June 28, 1971 Medical Record Number:  [id number]    Primary neurologist: Dr. Pearlean Brownie Referring MD: Eliezer Champagne, [name] Reason for Referral: Stroke like episode   HPI:   Initial visit 10/03/2020  :Natalie Ford is a 50 year old Caucasian lady seen today for initial office consultation visit for strokelike episode.  History is obtained from the patient, review of electronic medical records and I personally reviewed available imaging films in PACS.  She has past medical history of hyperlipidemia, dyspepsia, irritable bowel syndrome, COVID-19 infection who presented on 08/05/2020 with sudden onset of right-sided weakness numbness right upper quadrant vision difficulties and spinning sensation.  His symptoms initially improved but she continued to have focal deficits since she was given IV TPA after discussion of risk benefit.  Her symptoms resolved after that.  She was kept in the neurological intensive care unit and blood pressure tightly controlled.  Subsequently in the CT angiogram and head and neck were obtained prior to TPA which were unremarkable.  Subsequently MRI scan of the brain was obtained which was normal.  2D echo showed ejection fraction of 55 to 60% without cardiac source of embolism.  LDL cholesterol was elevated at 1 1 7  mg percent and hemoglobin A1c was 4.8.  She is started on aspirin and diet controlled for lipids.  Patient states that she has done well since discharge she has had no recurrent episodes.  She has had a lot of muscle aches and pains and has been diagnosed with rheumatoid arthritis and is planning on starting Enbrel soon.  She continues to have some blurred vision and has seen an ophthalmologist was prescribed Eysuvis eyedrops which she is using.  She also feels easy fatigability and feels a lot of brain fog.  She  has no significant vascular risk factors except hyperlipidemia.  She is tolerating aspirin well without bruising or bleeding.  She has no new complaints.  She denies any prior history of migraine headaches similar episodes in the past.  Update 05/14/21 : She returns for follow-up after last visit 7 months ago.  Patient was seen in the ER on 12/28/2020 with headaches and fleeting left-sided paresthesias.  CT head was negative for acute abnormality.  She was given headache cocktail and symptoms resolved.  Patient was diagnosed with COVID infection was the third time on September 6 and is recovering from that.  She continues to have migraine headaches which occur about 3-4 times a month.  She has been started on Inderal 30 mg twice daily for tachycardia which she has had post-COVID.  Her heart rate is still elevated at 95 today.  She is also been diagnosed with rheumatoid arthritis and started on Orencia monthly injections.  She complains of occasional pins-and-needles in the feet at night but these are not bothersome or frequent.  Patient does not want any work-up for peripheral neuropathy at this time.  She denies any difficulty with balance or gait weakness.  She remains on aspirin which is tolerating well without bruising or bleeding and Crestor which is tolerating without muscle aches and pains.   Update 08/13/2021 JM: Returns for follow-up visit after prior visit 3 months ago with Dr. 08/15/2021 for prior strokelike episode possibly complicated migraine.  During the interval time, propranolol further increased to 60 mg twice daily with some improvement. Use of Fioricet for emergent relief  but unable to tolerate due to increased fatigue after taking.  Currently experiencing almost daily dull tension type headaches in approximately 2-3 more severe headaches per month associated with phonophobia.  Denies photophobia or N/V.  Daily use of Tylenol in the morning as she will frequently awaken with headache.  Prior sleep  study 10/2020 negative for sleep apnea. She believes headaches have been worse since COVID back in September. She also gets Orencia infusions for RA with bad headache after.  Denies new or reoccurring strokelike symptoms.  Compliant on aspirin and Crestor without side effects.  Blood pressure today 115/77.  TCD unremarkable.  No further concerns at this time      ROS:   14 system review of systems is positive for those listed in HPI and pains and all other systems negative  PMH:  Past Medical History:  Diagnosis Date   Allergic rhinitis    COVID-19    x 3   Dyspepsia    Hypercholesteremia    IBS (irritable bowel syndrome)    Lumbosacral strain    RA (rheumatoid arthritis) (HCC)    Stroke (HCC) 08/05/2020   tia    Social History:  Social History   Socioeconomic History   Marital status: Married    Spouse name: Not on file   Number of children: 2   Years of education: Not on file   Highest education level: Not on file  Occupational History   Occupation: Charity fundraiser, Psychologist, forensic: [employer]   Occupation: Charity fundraiser, Scientist, research (physical sciences)    Comment: [employer]  Tobacco Use   Smoking status: Never   Smokeless tobacco: Never   Tobacco comments:    exposed to second hand smoke  Vaping Use   Vaping Use: Never used  Substance and Sexual Activity   Alcohol use: Yes    Comment: social use   Drug use: No   Sexual activity: Not on file  Other Topics Concern   Not on file  Social History Narrative   Lives with 2 children and husband   Right Handed   Drinks 1-2 cups caffeine daily   Social Determinants of Health   Financial Resource Strain: Not on file  Food Insecurity: Not on file  Transportation Needs: Not on file  Physical Activity: Not on file  Stress: Not on file  Social Connections: Not on file  Intimate Partner Violence: Not on file    Medications:   Current Outpatient Medications on File Prior to Visit  Medication Sig Dispense Refill   abatacept  (ORENCIA) 250 MG injection Use as directed Intravenous 500 mg every 28 days 2 each 5   albuterol (VENTOLIN HFA) 108 (90 Base) MCG/ACT inhaler Inhale 1-2 puffs into the lungs every 6 (six) hours as needed for wheezing or shortness of breath. 18 g 5   amphetamine-dextroamphetamine (ADDERALL) 20 MG tablet Take 1 tablet by mouth in the morning 30 tablet 0   aspirin EC 81 MG EC tablet Take 1 tablet (81 mg total) by mouth daily. Swallow whole. 30 tablet 11   buPROPion (WELLBUTRIN XL) 150 MG 24 hr tablet 3 tablets     cycloSPORINE, PF, (CEQUA) 0.09 % SOLN Place 1 drop into both eyes 2 times a day 180 each 3   fluticasone (FLONASE) 50 MCG/ACT nasal spray Place 2 sprays into both nostrils daily.     fluticasone-salmeterol (ADVAIR HFA) 230-21 MCG/ACT inhaler Inhale 2 puffs into the lungs 2 (two) times daily. 1 Inhaler 12  gabapentin (NEURONTIN) 300 MG capsule Take 300-600 mg by mouth 3 (three) times daily as needed.     ibuprofen (ADVIL,MOTRIN) 200 MG tablet Take 200 mg by mouth every 6 (six) hours as needed for headache or moderate pain.     lisdexamfetamine (VYVANSE) 70 MG capsule Take 1 capsule by mouth in the morning 30 capsule 0   Multiple Vitamin (MULTIVITAMIN) capsule Take 1 capsule by mouth at bedtime.     Omega-3 Fatty Acids (FISH OIL) 1000 MG CAPS Take 2 capsules by mouth daily.     predniSONE (DELTASONE) 5 MG tablet Take 15 mg by mouth daily.     propranolol (INDERAL) 20 MG tablet Take 2 tablets (40 mg total) by mouth 2 (two) times daily. 90 tablet 3   rosuvastatin (CRESTOR) 10 MG tablet Take 10 mg by mouth daily.     spironolactone (ALDACTONE) 50 MG tablet Take 75 mg by mouth daily.     No current facility-administered medications on file prior to visit.    Allergies:   Allergies  Allergen Reactions   Penicillins     rash    Physical Exam Today's Vitals   08/13/21 0812  BP: 115/77  Pulse: 73  Weight: 120 lb 9.6 oz (54.7 kg)  Height: 5' (1.524 m)   Body mass index is 23.55  kg/m.   General: Pleasant well developed, well nourished very pleasant middle-aged Caucasian lady, seated, in no evident distress Head: head normocephalic and atraumatic.   Neck: supple with no carotid or supraclavicular bruits Cardiovascular: regular rate and rhythm, no murmurs Musculoskeletal: no deformity Skin:  no rash/petichiae Vascular:  Normal pulses all extremities  Neurologic Exam Mental Status: Awake and fully alert. Oriented to place and time. Recent and remote memory intact. Attention span, concentration and fund of knowledge appropriate. Mood and affect appropriate.  Cranial Nerves: Pupils equal, briskly reactive to light. Extraocular movements full without nystagmus. Visual fields full to confrontation. Hearing intact. Facial sensation intact. Face, tongue, palate moves normally and symmetrically.  Motor: Normal bulk and tone. Normal strength in all tested extremity muscles. Sensory.: intact to touch , pinprick , position and vibratory sensation.  Coordination: Rapid alternating movements normal in all extremities. Finger-to-nose and heel-to-shin performed accurately bilaterally. Gait and Station: Arises from chair without difficulty. Stance is normal. Gait demonstrates normal stride length and balance . Able to heel, toe and tandem walk without difficulty.  Reflexes: 1+ and symmetric. Toes downgoing.       ASSESSMENT/PLAN: 50 year old Caucasian lady with transient episode of right-sided weakness numbness, spinning sensation and vision deficits nightly stroke like episode possibly complicated migraine treated with IV TPA with full recovery.  Brain imaging negative for acute stroke.  Vascular risk factors of hyperlipidemia only.  Episode of headache with fleeting left-sided paresthesias possibly complicated migraine episode in May 2022. Continued daily tension type headaches with possible migrainous type headache 2-3 times per month    -Recommend starting tizanidine 2 mg  nightly - continue for at least 1 week and if no benefit can consider dosage increase -Recommend trialing Nurtec 75 mg daily as needed for emergent relief - samples provided -Would not recommend triptan due to strokelike episode s/p tPA -Continue propranolol 60 mg twice daily - would not recommend further increase due to current BP and HR -Hesitant to trial antidepressant such as tricyclic due to current use of high-dose bupropion -Current use of gabapentin for dysesthesias managed by PCP -Limit use of Tylenol due to rebound headaches -Continue aspirin 81 mg daily  and Crestor 10 mg daily for secondary stroke prevention measures   Follow-up in 4 months or call earlier if needed    CC:  Merri Brunette, MD   I spent 34 minutes of face-to-face and non-face-to-face time with patient.  This included previsit chart review, lab review, study review, order entry, electronic health record documentation, patient education and discussion regarding prior strokelike episodes possibly complicated migraine, further medication use and headache prevention measures and answered all other questions to patient satisfaction  Ihor Austin, AGNP-BC  [facility] [address] [place], [place] [address]  Phone [phone] Fax [phone] Note: This document was prepared with digital dictation and possible smart phrase technology. Any transcriptional errors that result from this process are unintentional.

## 2021-08-13 NOTE — Patient Instructions (Addendum)
Your Plan:  Continue propranolol 60mg  twice daily - continue to monitor your blood pressure and heart rate  Try tizanidine 2mg  nightly for 1 week - if no benefit, please let me know and we can discuss increasing dosage  Try Nurtec 75mg  for emergent relief - please only use for the more severe headaches at onset and do not take more than 2 tablets per day  Start using a migraine/headache diary to better track when you have the more severe headaches/migraines  Limit use of Tylenol as daily use can cause a rebound headache    Follow up in 4 months or call earlier if needed     Thank you for coming to see at [facility]. I hope we have been able to provide you high quality care today.  You may receive a patient satisfaction survey over the next few weeks. We would appreciate your feedback and comments so that we may continue to improve ourselves and the health of our patients.

## 2021-08-14 ENCOUNTER — Ambulatory Visit: Payer: 59 | Admitting: Adult Health

## 2021-08-14 DIAGNOSIS — M0609 Rheumatoid arthritis without rheumatoid factor, multiple sites: Secondary | ICD-10-CM | POA: Diagnosis not present

## 2021-08-22 DIAGNOSIS — R7301 Impaired fasting glucose: Secondary | ICD-10-CM | POA: Diagnosis not present

## 2021-08-22 DIAGNOSIS — M0609 Rheumatoid arthritis without rheumatoid factor, multiple sites: Secondary | ICD-10-CM | POA: Diagnosis not present

## 2021-08-26 ENCOUNTER — Other Ambulatory Visit (HOSPITAL_COMMUNITY): Payer: Self-pay

## 2021-08-26 MED ORDER — LISDEXAMFETAMINE DIMESYLATE 70 MG PO CAPS
ORAL_CAPSULE | ORAL | 0 refills | Status: DC
  Filled 2021-08-26: qty 30, 30d supply, fill #0

## 2021-08-26 MED ORDER — AMPHETAMINE-DEXTROAMPHETAMINE 20 MG PO TABS
ORAL_TABLET | ORAL | 0 refills | Status: DC
  Filled 2021-08-26: qty 30, 30d supply, fill #0

## 2021-08-29 ENCOUNTER — Encounter: Payer: 59 | Attending: Physical Medicine and Rehabilitation | Admitting: Physical Medicine and Rehabilitation

## 2021-08-29 ENCOUNTER — Other Ambulatory Visit: Payer: Self-pay

## 2021-08-29 VITALS — BP 132/94 | HR 115 | Ht 60.0 in | Wt 112.0 lb

## 2021-08-29 DIAGNOSIS — H538 Other visual disturbances: Secondary | ICD-10-CM

## 2021-08-29 DIAGNOSIS — U099 Post covid-19 condition, unspecified: Secondary | ICD-10-CM

## 2021-08-29 MED ORDER — PREDNISONE 1 MG PO TABS: 4.0000 mg | ORAL_TABLET | Freq: Every day | ORAL | 3 refills | Status: DC

## 2021-08-29 NOTE — Progress Notes (Signed)
Subjective:    Patient ID: Natalie Ford, female    DOB: 1971-01-19, 51 y.o.   MRN: [id number]  HPI Natalie Ford is a 51 year old woman who presents to establish care for LONG COVID  1) LONG COVID She has had COVD three times Proir to COVID she had no medical issues Since COVID she has been diagnosed with asthma, RA, mini-stroke, hair loss, back pain, wrist pain, left knee and elbow pain, migraines -She tries to deal with the pain, she feels that she has gotten used to it -she used to run before she got all this -she tries to work out -when she does cardio, her heart rate goes up and she starts sweating -she can walk -COVID has changed her life and she takes a million medications -she feels tired all the time despite taking stimulant -she could fall asleep just sitting here.  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  -she has been on prednisone for 2 years and one of her main goals is to get off this- she plans to discuss with her rheumatologist.  -she has tried to wean off the prednisone herself and she felt very tired.   2) Migraines -she is on propanolol -fioricet knocked her out.  -Nurtec helped.   Pain Inventory Average Pain 5 Pain Right Now 5 My pain is constant and aching  In the last 24 hours, has pain interfered with the following? General activity 4 Relation with others 3 Enjoyment of life 3 What TIME of day is your pain at its worst? morning  and evening Sleep (in general) Fair  Pain is worse with: sitting and inactivity Pain improves with: heat/ice, therapy/exercise, and pacing activities Relief from Meds: 6  walk without assistance ability to climb steps?  yes do you drive?  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  loss of taste or smell  New pt  New pt    Family History  Problem Relation Age of Onset   Diabetes Mother    Heart disease Mother    Stroke Mother    Diabetes Sister    Leukemia Father    Stomach cancer  Paternal Grandfather    Kidney disease Paternal Aunt    Social History   Socioeconomic History   Marital status: Married    Spouse name: Not on file   Number of children: 2   Years of education: Not on file   Highest education level: Not on file  Occupational History   Occupation: Charity fundraiser, Psychologist, forensic: [employer]   Occupation: Charity fundraiser, Scientist, research (physical sciences)    Comment: [employer]  Tobacco Use   Smoking status: Never   Smokeless tobacco: Never   Tobacco comments:    exposed to second hand smoke  Vaping Use   Vaping Use: Never used  Substance and Sexual Activity   Alcohol use: Yes    Comment: social use   Drug use: No   Sexual activity: Not on file  Other Topics Concern   Not on file  Social History Narrative   Lives with 2 children and husband   Right Handed   Drinks 1-2 cups caffeine daily   Social Determinants of Health   Financial Resource Strain: Not on file  Food Insecurity: Not on file  Transportation Needs: Not on file  Physical Activity: Not on file  Stress: Not on file  Social Connections: Not on file   Past Surgical History:  Procedure Laterality Date   CARPAL TUNNEL RELEASE Right    CESAREAN SECTION     x 2   Past Medical History:  Diagnosis Date   Allergic rhinitis    COVID-19    x 3   Dyspepsia    Hypercholesteremia    IBS (irritable bowel syndrome)    Lumbosacral strain    RA (rheumatoid arthritis) (HCC)    Stroke ([place]) 08/05/2020   tia   BP (!) 132/94    Pulse (!) 115    Ht 5' (1.524 m)    Wt 112 lb (50.8 kg)    SpO2 94%    BMI 21.87 kg/m   Opioid Risk Score:   Fall Risk Score:  `1  Depression screen PHQ 2/9  Depression screen [facility] 2/9 08/29/2021 01/05/2020 11/15/2019 06/29/2013  Decreased Interest 0 0 0 0  Down, Depressed, Hopeless 1 0 0 0  PHQ - 2 Score 1 0 0 0  Altered sleeping 1 - - -  Tired, decreased energy 3 - - -  Change in appetite 1 - - -  Feeling bad or failure about yourself  0 - - -  Trouble concentrating 3 - -  -  Moving slowly or fidgety/restless 0 - - -  Suicidal thoughts 0 - - -  PHQ-9 Score 9 - - -  Difficult doing work/chores Somewhat difficult - - -     Review of Systems  Musculoskeletal:  Positive for back pain.       Bilateral elbow pain Left knee pain  All other systems reviewed and are negative.     Objective:   Physical Exam Gen: no distress, normal appearing HEENT: oral mucosa pink and moist, NCAT Cardio: Tachycardic Chest: normal effort, normal rate of breathing Abd: soft, non-distended Ext: no edema Psych: pleasant, normal affect Skin: intact Neuro: Alert and oriented x3     Assessment & Plan:   1) LONG COVID -Recommended starting NAC (N-Acetyl cysteine) 600mg  BID for her fatigue. Discussed its benefits in boosting glutathione and mitochondrial function. -dicussed cardiopulmonary rehab -discussed low dose naltrexone.  -discussed trial of intermittent fasting and its benefits for autophagy and stem cell production for longer fasts.   2) Blurry vision: -encouraged getting new glasses.   3) Migraines -continue propanolol and Nurtec.   4) POTS: -continue propanolol  5) Diffuse joint pains: -Discussed following foods that may reduce pain: 1) Ginger (especially studied for arthritis)- reduce leukotriene production to decrease inflammation 2) Blueberries- high in phytonutrients that decrease inflammation 3) Salmon- marine omega-3s reduce joint swelling and pain 4) Pumpkin seeds- reduce inflammation 5) dark chocolate- reduces inflammation 6) turmeric- reduces inflammation 7) tart cherries - reduce pain and stiffness 8) extra virgin olive oil - its compound olecanthal helps to block prostaglandins  9) chili peppers- can be eaten or applied topically via capsaicin 10) mint- helpful for headache, muscle aches, joint pain, and itching 11) garlic- reduces inflammation  Link to further information on diet for chronic pain:  [email or web address]   6) Rheumatoid arthritis: -decrease to 14mg  daily for 1 month.  -discussed slow wean over the course of a year.  -discussed that this could be contributing to her elevated blood sugars and elevated WBC.   7) Provided with list of supplements that can help with dyslipidemia: Reviewed LDL 117.  -discussed that statin lowers both good and bad cholesterol.  1) Vitamin B3 500-4,000mg  in divided doses daily (would recommend starting low as can cause uncomfortable facial flushing if started at too high  a dose) 2) Phytosterols 2.15 grams daily 3) Fermented soy 30-50 grams daily 4) EGCG (found in green tea): 500-1000mg  daily 5) Omega-3 fatty acids 3000-5,000mg  daily 6) Flax seed 40 grams daily 7) Monounsaturated fats 20-40 grams daily (olives, olive oil, nuts), also reduces cardiovascular disease 8) Sesame: 40 grams daily 9) Gamma/delta tocotrienols- a family of unsaturated forms of Vitamin E- 200mg  with dinner 10) Pantethine 900mg  daily in divided doses 11) Resveratrol 250mg  daily 12) N Acetyl Cysteine 2000mg  daily in divided doses 13) Curcumin 2000-5000mg  in divided doses daily 14) Pomegranate juice: 8 ounces daily, also helps to lower blood pressure 15) Pomegranate seeds one cup daily, also helps to lower blood pressure 16) Citrus Bergamot 1000mg  daily, also helps with glucose control and weight loss 17) Vitamin C 500mg  daily 18) Quercetin 500-1000mg  daily 19) Glutathione 20) Probiotics 60-100 billion organisms per day 21) Fiber 22) Oats 23) Aged garlic (can eat as food or supplement of 600-900mg  per day) 24) Chia seeds 25 grams per day 25) Lycopene- carotenoid found in high concentrations in tomatoes. 26) Alpha linolenic acid 27) Flavonoids and anthocyanins 28) Wogonin- flavanoid that enhances reverse cholesterol transport 29) Coenzyme Q10 30) Pantethine- derivative of Vitamin B5: 300mg   three times per day or 450mg  twice per day with or without food 31) Barley and other whole grains 32) Orange juice 33) L- carnitine 34) L- Lysine 35) L- Arginine 36) Almonds 37) Morin 38) Rutin 39) Carnosine 40) Histidine  41) Kaempferol  42) Organosulfur compounds 43) Vitamin E 44) Oleic acid 45) RBO (ferulic acid gammaoryzanol) 46) grape seed extract 47) Red wine 48) Berberine HCL 500mg  daily or twice per day- more effective and with fewer adverse effects that ezetimibe monotherapy 49) red yeast rice 2400- 4800 mg/day 50) chlorella 51) Licorice

## 2021-08-29 NOTE — Patient Instructions (Addendum)
NAC 600mg  BID N-acetyl-cysteine  Wobenzymes, plus Nattokinase Hyperbaric oxygen   -Discussed following foods that may reduce pain: 1) Ginger (especially studied for arthritis)- reduce leukotriene production to decrease inflammation 2) Blueberries- high in phytonutrients that decrease inflammation 3) Salmon- marine omega-3s reduce joint swelling and pain 4) Pumpkin seeds- reduce inflammation 5) dark chocolate- reduces inflammation 6) turmeric- reduces inflammation 7) tart cherries - reduce pain and stiffness 8) extra virgin olive oil - its compound olecanthal helps to block prostaglandins  9) chili peppers- can be eaten or applied topically via capsaicin 10) mint- helpful for headache, muscle aches, joint pain, and itching 11) garlic- reduces inflammation  Link to further information on diet for chronic pain:    Provided with list of supplements that can help with dyslipidemia: 1) Vitamin B3 500-4,000mg  in divided doses daily (would recommend starting low as can cause uncomfortable facial flushing if started at too high a dose) 2) Phytosterols 2.15 grams daily 3) Fermented soy 30-50 grams daily 4) EGCG (found in green tea): 500-1000mg  daily 5) Omega-3 fatty acids 3000-5,000mg  daily 6) Flax seed 40 grams daily 7) Monounsaturated fats 20-40 grams daily (olives, olive oil, nuts), also reduces cardiovascular disease 8) Sesame: 40 grams daily 9) Gamma/delta tocotrienols- a family of unsaturated forms of Vitamin E- 200mg  with dinner 10) Pantethine 900mg  daily in divided doses 11) Resveratrol 250mg  daily 12) N Acetyl Cysteine 2000mg  daily in divided doses 13) Curcumin 2000-5000mg  in divided doses daily 14) Pomegranate juice: 8 ounces daily, also helps to lower blood pressure 15) Pomegranate seeds one cup daily, also helps to lower blood pressure 16) Citrus Bergamot 1000mg  daily, also helps with  glucose control and weight loss 17) Vitamin C 500mg  daily 18) Quercetin 500-1000mg  daily 19) Glutathione 20) Probiotics 60-100 billion organisms per day 21) Fiber 22) Oats 23) Aged garlic (can eat as food or supplement of 600-900mg  per day) 24) Chia seeds 25 grams per day 25) Lycopene- carotenoid found in high concentrations in tomatoes. 26) Alpha linolenic acid 27) Flavonoids and anthocyanins 28) Wogonin- flavanoid that enhances reverse cholesterol transport 29) Coenzyme Q10 30) Pantethine- derivative of Vitamin B5: 300mg  three times per day or 450mg  twice per day with or without food 31) Barley and other whole grains 32) Orange juice 33) L- carnitine 34) L- Lysine 35) L- Arginine 36) Almonds 37) Morin 38) Rutin 39) Carnosine 40) Histidine  41) Kaempferol  42) Organosulfur compounds 43) Vitamin E 44) Oleic acid 45) RBO (ferulic acid gammaoryzanol) 46) grape seed extract 47) Red wine 48) Berberine HCL 500mg  daily or twice per day- more effective and with fewer adverse effects that ezetimibe monotherapy 49) red yeast rice 2400- 4800 mg/day 50) chlorella 51) Licorice

## 2021-09-09 ENCOUNTER — Other Ambulatory Visit (HOSPITAL_COMMUNITY): Payer: Self-pay

## 2021-09-12 ENCOUNTER — Telehealth: Payer: Self-pay | Admitting: Pharmacist

## 2021-09-12 NOTE — Telephone Encounter (Signed)
Called patient to schedule an appointment for the [facility]. I was unable to reach the patient so I left a HIPAA-compliant message requesting that the patient return my call.   Luke Van Ausdall, PharmD, BCACP, CPP Clinical Pharmacist [facility] [phone]  

## 2021-09-14 ENCOUNTER — Encounter: Payer: Self-pay | Admitting: Adult Health

## 2021-09-16 ENCOUNTER — Other Ambulatory Visit (HOSPITAL_COMMUNITY): Payer: Self-pay

## 2021-09-16 ENCOUNTER — Other Ambulatory Visit: Payer: Self-pay | Admitting: *Deleted

## 2021-09-16 ENCOUNTER — Telehealth: Payer: Self-pay

## 2021-09-16 MED ORDER — PROPRANOLOL HCL 40 MG PO TABS: 40.0000 mg | ORAL_TABLET | Freq: Three times a day (TID) | ORAL | 2 refills | Status: DC

## 2021-09-16 MED ORDER — NURTEC 75 MG PO TBDP: 75.0000 mg | ORAL_TABLET | Freq: Every day | ORAL | 2 refills | Status: DC | PRN

## 2021-09-16 NOTE — Telephone Encounter (Signed)
I have submitted a PA request for Nurtec on CMM, Key: [id number]. Awaiting determination from [employer].

## 2021-09-16 NOTE — Telephone Encounter (Signed)
The request has been approved. The authorization is effective for a maximum of 6 fills from 09/16/2021 to 03/15/2022, as long as the member is enrolled in their current health plan. The request was approved with a quantity restriction. This has been approved for a quantity limit of 18 with a day supply limit of 30.

## 2021-09-16 NOTE — Telephone Encounter (Signed)
Okay to place both orders - at prior visit, reported taking 60mg  BID of propranolol so if taking 40mg  TID that is fine if this is working for her as total daily dose the same. Thank you.

## 2021-09-17 DIAGNOSIS — M0609 Rheumatoid arthritis without rheumatoid factor, multiple sites: Secondary | ICD-10-CM | POA: Diagnosis not present

## 2021-09-23 ENCOUNTER — Other Ambulatory Visit (HOSPITAL_COMMUNITY): Payer: Self-pay

## 2021-09-25 ENCOUNTER — Other Ambulatory Visit (HOSPITAL_COMMUNITY): Payer: Self-pay

## 2021-09-25 MED ORDER — AMPHETAMINE-DEXTROAMPHETAMINE 20 MG PO TABS
ORAL_TABLET | ORAL | 0 refills | Status: DC
  Filled 2021-09-25: qty 30, 30d supply, fill #0
  Filled ????-??-?? (×2): fill #0

## 2021-09-25 MED ORDER — LISDEXAMFETAMINE DIMESYLATE 20 MG PO CAPS
ORAL_CAPSULE | ORAL | 0 refills | Status: DC
  Filled 2021-09-25: qty 30, 30d supply, fill #0
  Filled ????-??-?? (×2): fill #0

## 2021-09-26 ENCOUNTER — Other Ambulatory Visit (HOSPITAL_COMMUNITY): Payer: Self-pay

## 2021-09-26 ENCOUNTER — Encounter: Payer: Self-pay | Admitting: Physical Medicine and Rehabilitation

## 2021-09-27 ENCOUNTER — Other Ambulatory Visit (HOSPITAL_COMMUNITY): Payer: Self-pay

## 2021-09-27 MED ORDER — LISDEXAMFETAMINE DIMESYLATE 70 MG PO CAPS
ORAL_CAPSULE | ORAL | 0 refills | Status: DC
  Filled 2021-09-27: qty 30, 30d supply, fill #0

## 2021-09-30 ENCOUNTER — Other Ambulatory Visit (HOSPITAL_COMMUNITY): Payer: Self-pay

## 2021-10-03 ENCOUNTER — Other Ambulatory Visit (HOSPITAL_COMMUNITY): Payer: Self-pay

## 2021-10-11 ENCOUNTER — Other Ambulatory Visit: Payer: Self-pay

## 2021-10-11 ENCOUNTER — Emergency Department (HOSPITAL_BASED_OUTPATIENT_CLINIC_OR_DEPARTMENT_OTHER): Payer: 59

## 2021-10-11 ENCOUNTER — Emergency Department (HOSPITAL_BASED_OUTPATIENT_CLINIC_OR_DEPARTMENT_OTHER)
Admission: EM | Admit: 2021-10-11 | Discharge: 2021-10-11 | Disposition: A | Discharge status: Home / Self Care | Payer: 59 | Attending: Emergency Medicine | Admitting: Emergency Medicine

## 2021-10-11 ENCOUNTER — Telehealth: Payer: Self-pay | Admitting: Cardiology

## 2021-10-11 ENCOUNTER — Emergency Department (HOSPITAL_COMMUNITY): Payer: 59

## 2021-10-11 ENCOUNTER — Encounter (HOSPITAL_BASED_OUTPATIENT_CLINIC_OR_DEPARTMENT_OTHER): Payer: Self-pay

## 2021-10-11 DIAGNOSIS — Z20822 Contact with and (suspected) exposure to covid-19: Secondary | ICD-10-CM | POA: Diagnosis not present

## 2021-10-11 DIAGNOSIS — M5412 Radiculopathy, cervical region: Secondary | ICD-10-CM | POA: Diagnosis not present

## 2021-10-11 DIAGNOSIS — Z8616 Personal history of COVID-19: Secondary | ICD-10-CM | POA: Insufficient documentation

## 2021-10-11 DIAGNOSIS — G43909 Migraine, unspecified, not intractable, without status migrainosus: Secondary | ICD-10-CM | POA: Diagnosis not present

## 2021-10-11 DIAGNOSIS — R200 Anesthesia of skin: Secondary | ICD-10-CM

## 2021-10-11 DIAGNOSIS — R519 Headache, unspecified: Secondary | ICD-10-CM | POA: Diagnosis not present

## 2021-10-11 DIAGNOSIS — R9431 Abnormal electrocardiogram [ECG] [EKG]: Secondary | ICD-10-CM | POA: Diagnosis not present

## 2021-10-11 DIAGNOSIS — M2578 Osteophyte, vertebrae: Secondary | ICD-10-CM | POA: Diagnosis not present

## 2021-10-11 DIAGNOSIS — M47812 Spondylosis without myelopathy or radiculopathy, cervical region: Secondary | ICD-10-CM | POA: Diagnosis not present

## 2021-10-11 DIAGNOSIS — G43009 Migraine without aura, not intractable, without status migrainosus: Secondary | ICD-10-CM

## 2021-10-11 LAB — COMPREHENSIVE METABOLIC PANEL
ALT: 18 U/L (ref 0–44)
AST: 17 U/L (ref 15–41)
Albumin: 4.1 g/dL (ref 3.5–5.0)
Alkaline Phosphatase: 39 U/L (ref 38–126)
Anion gap: 9 (ref 5–15)
BUN: 19 mg/dL (ref 6–20)
CO2: 24 mmol/L (ref 22–32)
Calcium: 9 mg/dL (ref 8.9–10.3)
Chloride: 101 mmol/L (ref 98–111)
Creatinine, Ser: 1.03 mg/dL — ABNORMAL HIGH (ref 0.44–1.00)
GFR, Estimated: >60 mL/min (ref >60)
Glucose, Bld: 135 mg/dL — ABNORMAL HIGH (ref 70–99)
Potassium: 4 mmol/L (ref 3.5–5.1)
Sodium: 134 mmol/L — ABNORMAL LOW (ref 135–145)
Total Bilirubin: 0.6 mg/dL (ref 0.3–1.2)
Total Protein: 6.6 g/dL (ref 6.5–8.1)

## 2021-10-11 LAB — CBC
HCT: 40.8 % (ref 36.0–46.0)
Hemoglobin: 14.2 g/dL (ref 12.0–15.0)
MCH: 33.6 pg (ref 26.0–34.0)
MCHC: 34.8 g/dL (ref 30.0–36.0)
MCV: 96.5 fL (ref 80.0–100.0)
Platelets: 289 10*3/uL (ref 150–400)
RBC: 4.23 MIL/uL (ref 3.87–5.11)
RDW: 11.6 % (ref 11.5–15.5)
WBC: 11.5 10*3/uL — ABNORMAL HIGH (ref 4.0–10.5)
nRBC: 0 % (ref 0.0–0.2)

## 2021-10-11 LAB — URINALYSIS, ROUTINE W REFLEX MICROSCOPIC
Bilirubin Urine: NEGATIVE
Glucose, UA: NEGATIVE mg/dL
Ketones, ur: NEGATIVE mg/dL
Leukocytes,Ua: NEGATIVE
Nitrite: NEGATIVE
Protein, ur: NEGATIVE mg/dL
Specific Gravity, Urine: 1.015 (ref 1.005–1.030)
pH: 7 (ref 5.0–8.0)

## 2021-10-11 LAB — DIFFERENTIAL
Abs Immature Granulocytes: 0.05 10*3/uL (ref 0.00–0.07)
Basophils Absolute: 0 10*3/uL (ref 0.0–0.1)
Basophils Relative: 0 %
Eosinophils Absolute: 0 10*3/uL (ref 0.0–0.5)
Eosinophils Relative: 0 %
Immature Granulocytes: 0 %
Lymphocytes Relative: 9 %
Lymphs Abs: 1 10*3/uL (ref 0.7–4.0)
Monocytes Absolute: 0.5 10*3/uL (ref 0.1–1.0)
Monocytes Relative: 4 %
Neutro Abs: 10 10*3/uL — ABNORMAL HIGH (ref 1.7–7.7)
Neutrophils Relative %: 87 %

## 2021-10-11 LAB — PREGNANCY, URINE: Preg Test, Ur: NEGATIVE

## 2021-10-11 LAB — RAPID URINE DRUG SCREEN, HOSP PERFORMED
Amphetamines: POSITIVE — AB
Barbiturates: NOT DETECTED
Benzodiazepines: NOT DETECTED
Cocaine: NOT DETECTED
Opiates: NOT DETECTED
Tetrahydrocannabinol: NOT DETECTED

## 2021-10-11 LAB — URINALYSIS, MICROSCOPIC (REFLEX)

## 2021-10-11 LAB — TROPONIN I (HIGH SENSITIVITY)
Troponin I (High Sensitivity): 3 ng/L (ref <18)
Troponin I (High Sensitivity): 4 ng/L (ref <18)

## 2021-10-11 LAB — RESP PANEL BY RT-PCR (FLU A&B, COVID) ARPGX2
Influenza A by PCR: NEGATIVE
Influenza B by PCR: NEGATIVE
SARS Coronavirus 2 by RT PCR: NEGATIVE

## 2021-10-11 LAB — APTT: aPTT: 24 seconds (ref 24–36)

## 2021-10-11 LAB — ETHANOL: Alcohol, Ethyl (B): <10 mg/dL (ref <10)

## 2021-10-11 LAB — PROTIME-INR
INR: 0.9 (ref 0.8–1.2)
Prothrombin Time: 12.1 seconds (ref 11.4–15.2)

## 2021-10-11 MED ORDER — LORAZEPAM 2 MG/ML IJ SOLN
1.0000 mg | Freq: Once | INTRAMUSCULAR | Status: AC
Start: 2021-10-11 — End: 2021-10-11
  Administered 2021-10-11: 1 mg via INTRAVENOUS
  Filled 2021-10-11: qty 1

## 2021-10-11 MED ORDER — METOCLOPRAMIDE HCL 5 MG/ML IJ SOLN
10.0000 mg | Freq: Once | INTRAMUSCULAR | Status: AC
  Administered 2021-10-11: 10 mg via INTRAVENOUS
  Filled 2021-10-11: qty 2

## 2021-10-11 MED ORDER — KETOROLAC TROMETHAMINE 30 MG/ML IJ SOLN
30.0000 mg | Freq: Once | INTRAMUSCULAR | Status: AC
  Administered 2021-10-11: 30 mg via INTRAVENOUS
  Filled 2021-10-11: qty 1

## 2021-10-11 MED ORDER — DIPHENHYDRAMINE HCL 50 MG/ML IJ SOLN
25.0000 mg | Freq: Once | INTRAMUSCULAR | Status: AC
  Administered 2021-10-11: 25 mg via INTRAVENOUS
  Filled 2021-10-11: qty 1

## 2021-10-11 NOTE — Progress Notes (Unsigned)
Cardiology Office Note   Date:  10/11/2021   ID:  Natalie Ford, DOB [birth year], MRN [id number]  PCP:  Natalie Brunette, MD  Cardiologist:  Natalie Man MD  No chief complaint on file.     History of Present Illness: Natalie Ford is a 51 y.o. female who is seen as a work in for evaluation of chest pain. She was seem in 2021 with symptoms of palpitations. She had Covid 19 and was also diagnosed with RA. Echocardiogram on 01/03/2020 showed EF 65 to 70%, normal RV function, no significant valvular disease.  Zio patch x3 days on 01/11/2020 showed occasional PVCs (1.9% of beats). She was treated for asthma by pulmonary.   In September she was admitted with acute TIA with right sided symptoms. Treated with IV TPA with resolution of symptoms. MRI, CT angiogram and transcranial doppler all negative.     Past Medical History:  Diagnosis Date   Allergic rhinitis    COVID-19    x 3   Dyspepsia    Hypercholesteremia    IBS (irritable bowel syndrome)    Lumbosacral strain    RA (rheumatoid arthritis) (HCC)    Stroke (HCC) 08/05/2020   tia    Past Surgical History:  Procedure Laterality Date   CARPAL TUNNEL RELEASE Right    CESAREAN SECTION     x 2     Current Outpatient Medications  Medication Sig Dispense Refill   abatacept (ORENCIA) 250 MG injection Use as directed Intravenous 500 mg every 28 days 2 each 5   albuterol (VENTOLIN HFA) 108 (90 Base) MCG/ACT inhaler Inhale 1-2 puffs into the lungs every 6 (six) hours as needed for wheezing or shortness of breath. 18 g 5   amphetamine-dextroamphetamine (ADDERALL) 20 MG tablet Take 1 tablet by mouth every morning 30 tablet 0   aspirin EC 81 MG EC tablet Take 1 tablet (81 mg total) by mouth daily. Swallow whole. 30 tablet 11   buPROPion (WELLBUTRIN XL) 150 MG 24 hr tablet 3 tablets     cycloSPORINE, PF, (CEQUA) 0.09 % SOLN Place 1 drop into both eyes 2 times a day 180 each 3   fluticasone (FLONASE) 50 MCG/ACT nasal  spray Place 2 sprays into both nostrils daily.     fluticasone-salmeterol (ADVAIR HFA) 230-21 MCG/ACT inhaler Inhale 2 puffs into the lungs 2 (two) times daily. 1 Inhaler 12   gabapentin (NEURONTIN) 300 MG capsule Take 300-600 mg by mouth 3 (three) times daily as needed.     ibuprofen (ADVIL,MOTRIN) 200 MG tablet Take 200 mg by mouth every 6 (six) hours as needed for headache or moderate pain.     lisdexamfetamine (VYVANSE) 70 MG capsule Take 1 capsule by mouth every morning. 30 capsule 0   lisdexamfetamine (VYVANSE) 70 MG capsule Take 1 capsule by mouth in the morning 30 capsule 0   Multiple Vitamin (MULTIVITAMIN) capsule Take 1 capsule by mouth at bedtime.     Omega-3 Fatty Acids (FISH OIL) 1000 MG CAPS Take 2 capsules by mouth daily.     predniSONE (DELTASONE) 1 MG tablet Take 4 tablets (4 mg total) by mouth daily with breakfast. 120 tablet 3   propranolol (INDERAL) 40 MG tablet Take 1 tablet (40 mg total) by mouth 3 (three) times daily. 90 tablet 2   Rimegepant Sulfate (NURTEC) 75 MG TBDP Take 75 mg by mouth daily as needed (emergent migraine relief). 4 tablet 2   rosuvastatin (CRESTOR) 10 MG tablet Take 10 mg by  mouth daily.     spironolactone (ALDACTONE) 50 MG tablet Take 75 mg by mouth daily.     tiZANidine (ZANAFLEX) 2 MG tablet Take 1 tablet (2 mg total) by mouth at bedtime. 30 tablet 5   No current facility-administered medications for this visit.    Allergies:   Penicillins    Social History:  The patient  reports that she has never smoked. She has never used smokeless tobacco. She reports current alcohol use. She reports that she does not use drugs.   Family History:  The patient's ***family history includes Diabetes in her mother and sister; Heart disease in her mother; Kidney disease in her paternal aunt; Leukemia in her father; Stomach cancer in her paternal grandfather; Stroke in her mother.    ROS:  Please see the history of present illness.   Otherwise, review of systems  are positive for {NONE DEFAULTED:18576}.   All other systems are reviewed and negative.    PHYSICAL EXAM: VS:  There were no vitals taken for this visit. , BMI There is no height or weight on file to calculate BMI. GEN: Well nourished, well developed, in no acute distress HEENT: normal Neck: no JVD, carotid bruits, or masses Cardiac: ***RRR; no murmurs, rubs, or gallops,no edema  Respiratory:  clear to auscultation bilaterally, normal work of breathing GI: soft, nontender, nondistended, + BS MS: no deformity or atrophy Skin: warm and dry, no rash Neuro:  Strength and sensation are intact Psych: euthymic mood, full affect   EKG:  EKG {ACTION; IS/IS [id number]}OT:[id number]} ordered today. The ekg ordered today demonstrates ***   Recent Labs: 05/30/2021: ALT 18; BUN 17; Creatinine 0.92; Hemoglobin 13.5; Platelet Count 266; Potassium 4.4; Sodium 139    Lipid Panel    Component Value Date/Time   CHOL 207 (H) 08/06/2020 0336   TRIG 85 08/06/2020 0336   TRIG 65 08/21/2006 0923   HDL 73 08/06/2020 0336   CHOLHDL 2.8 08/06/2020 0336   VLDL 17 08/06/2020 0336   LDLCALC 117 (H) 08/06/2020 0336   LDLDIRECT 123.2 07/13/2007 0907   Dated 05/21/21: cholesterol 256, triglycerides 163, HDL 98, LDL 86. TSH normal Dated 05/30/21: normal CBC and CMET  Wt Readings from Last 3 Encounters:  08/29/21 112 lb (50.8 kg)  08/13/21 120 lb 9.6 oz (54.7 kg)  05/30/21 118 lb 11.2 oz (53.8 kg)      Other studies Reviewed: Additional studies/ records that were reviewed today include:   Event monitor 01/11/20: Study Highlights    Occasional PVCs (1.9% of beats) Patient triggered events corresponded to sinus rhythm  PVCs   3 days of data recorded on Zio monitor. Patient had a min HR of 59 bpm, max HR of 160 bpm, and avg HR of 99 bpm. Predominant underlying rhythm was Sinus Rhythm. No VT, SVT, atrial fibrillation, high degree block, or pauses noted. Isolated atrial ectopy was rare (<1%).  Isolated ventricular  ectopy was occasional (1.9%).  Longest episode of bigeminy lasted 21 seconds and longest episode of trigeminy lasted 6 seconds.  There were 12 triggered events, which corresponded to sinus rhythm  PVCs    Echo 08/06/20: IMPRESSIONS     1. Left ventricular ejection fraction, by estimation, is 55 to 60%. The  left ventricle has normal function. The left ventricle has no regional  wall motion abnormalities. Left ventricular diastolic parameters were  normal.   2. Right ventricular systolic function is normal. The right ventricular  size is normal. There is normal pulmonary artery systolic pressure.  The  estimated right ventricular systolic pressure is 21.5 mmHg.   3. The mitral valve is normal in structure. Mild mitral valve  regurgitation. No evidence of mitral stenosis.   4. The aortic valve is tricuspid. Aortic valve regurgitation is not  visualized. No aortic stenosis is present.   5. The inferior vena cava is normal in size with greater than 50%  respiratory variability, suggesting right atrial pressure of 3 mmHg.   ASSESSMENT AND PLAN:  1.  ***   Current medicines are reviewed at length with the patient today.  The patient {ACTIONS; HAS/DOES NOT HAVE:19233} concerns regarding medicines.  The following changes have been made:  {PLAN; NO CHANGE:13088:s}  Labs/ tests ordered today include: *** No orders of the defined types were placed in this encounter.        Disposition:   FU with *** in {gen number [id number]} {Days to years:10300}  Signed, Thurza Kwiecinski [place], MD  10/11/2021 11:10 AM    [facility] [address], [place], [place], [address] Phone [phone], Fax [phone]

## 2021-10-11 NOTE — ED Notes (Signed)
Report given to CN at [facility]

## 2021-10-11 NOTE — Telephone Encounter (Signed)
Pt c/o of Chest Pain: STAT if CP now or developed within 24 hours  1. Are you having CP right now? No   2. Are you experiencing any other symptoms (ex. SOB, nausea, vomiting, sweating)? Headache, tired, sweating  3. How long have you been experiencing CP? Since yesterday  4. Is your CP continuous or coming and going? continuous  5. Have you taken Nitroglycerin? No  ?

## 2021-10-11 NOTE — ED Provider Notes (Signed)
°  Physical Exam  BP 99/67 (BP Location: Left Arm)    Pulse 70    Temp 98.4 F (36.9 C)    Resp 18    Ht 5' (1.524 m)    Wt 51.7 kg    SpO2 100%    BMI 22.26 kg/m   Physical Exam  Procedures  Procedures  ED Course / MDM    Medical Decision Making Patient transferred for MRI brain and MRI cervical spine.  Patient has history of migraines but has some left sided numbness.  Thought to have complex migraine versus TIA.  8:51 PM MRI brain did not show any stroke.  And MR cervical spine showed some mild to moderate spinal stenosis but no obvious cord changes.  She is on prednisone already.  At this point she can follow-up with her neurologist and will refer to neurosurgery outpatient.     Problems Addressed: Migraine without aura and without status migrainosus, not intractable: acute illness or injury Numbness: acute illness or injury  Amount and/or Complexity of Data Reviewed Labs: ordered. Decision-making details documented in ED Course. Radiology: ordered and independent interpretation performed. Decision-making details documented in ED Course.  Risk Prescription drug management.          Drenda Freeze, MD 10/11/21 2052

## 2021-10-11 NOTE — ED Provider Notes (Signed)
Emergency Department Provider Note   I have reviewed the triage vital signs and the nursing notes.   HISTORY  Chief Complaint Numbness and Headache   HPI Natalie Ford is a 51 y.o. female with PMH reviewed below presents to the ED with HA and left face and arm numbness. Patient last was normal at 8 AM today. She has a diffuse HA as well which has been gradual in onset today. She felt well yesterday with the exception of some CP. Symptoms resolved but did concern her to the point of calling her Cardiologist this AM.  She notes that in December 2021 she presented with symptoms worse than what she is experiencing today and ultimately received tPA but resulting stroke work-up came back negative.  There was thought for possible complicated migraine.  She had another ED visit in May 2022 also with numbness but symptoms improved with migraine cocktail at that time as well.  Her chest pain from yesterday has not returned.  No other modifying factors.   Past Medical History:  Diagnosis Date   Allergic rhinitis    COVID-19    x 3   Dyspepsia    Hypercholesteremia    IBS (irritable bowel syndrome)    Lumbosacral strain    RA (rheumatoid arthritis) (HCC)    Stroke (HCC) 08/05/2020   tia    Review of Systems  Constitutional: No fever/chills Eyes: No visual changes. ENT: No sore throat. Cardiovascular: Denies chest pain. Respiratory: Denies shortness of breath. Gastrointestinal: No abdominal pain.  No nausea, no vomiting.  No diarrhea.  No constipation. Genitourinary: Negative for dysuria. Musculoskeletal: Negative for back pain. Skin: Negative for rash. Neurological: Negative for focal weakness. Positive HA and left face/arm numbness.    ____________________________________________   PHYSICAL EXAM:  VITAL SIGNS: ED Triage Vitals  Enc Vitals Group     BP 10/11/21 1157 124/85     Pulse Rate 10/11/21 1157 90     Resp 10/11/21 1157 18     Temp 10/11/21 1157 98.8 F  (37.1 C)     Temp Source 10/11/21 1157 Oral     SpO2 10/11/21 1157 100 %     Weight 10/11/21 1159 114 lb (51.7 kg)     Height 10/11/21 1159 5' (1.524 m)   Constitutional: Alert and oriented. Well appearing and in no acute distress. Eyes: Conjunctivae are normal. PERRL. EOMI. Head: Atraumatic. Nose: No congestion/rhinnorhea. Mouth/Throat: Mucous membranes are moist.  Neck: No stridor.  Cardiovascular: Normal rate, regular rhythm. Good peripheral circulation. Grossly normal heart sounds.   Respiratory: Normal respiratory effort.  No retractions. Lungs CTAB. Gastrointestinal: Soft and nontender. No distention.  Musculoskeletal: No lower extremity tenderness nor edema. No gross deformities of extremities. Neurologic:  Normal speech and language.  No facial asymmetry.  5/5 strength in the bilateral upper and lower extremities with no pronator drift.  Normal finger-to-nose testing.  Decree sensation to the left face and left arm/hand.  Normal sensation in the bilateral lower extremities.  Skin:  Skin is warm, dry and intact. No rash noted.   ____________________________________________   LABS (all labs ordered are listed, but only abnormal results are displayed)  Labs Reviewed  CBC - Abnormal; Notable for the following components:      Result Value   WBC 11.5 (*)    All other components within normal limits  DIFFERENTIAL - Abnormal; Notable for the following components:   Neutro Abs 10.0 (*)    All other components within normal limits  COMPREHENSIVE METABOLIC PANEL - Abnormal; Notable for the following components:   Sodium 134 (*)    Glucose, Bld 135 (*)    Creatinine, Ser 1.03 (*)    All other components within normal limits  RAPID URINE DRUG SCREEN, HOSP PERFORMED - Abnormal; Notable for the following components:   Amphetamines POSITIVE (*)    All other components within normal limits  URINALYSIS, ROUTINE W REFLEX MICROSCOPIC - Abnormal; Notable for the following components:    Hgb urine dipstick TRACE (*)    All other components within normal limits  URINALYSIS, MICROSCOPIC (REFLEX) - Abnormal; Notable for the following components:   Bacteria, UA FEW (*)    All other components within normal limits  RESP PANEL BY RT-PCR (FLU A&B, COVID) ARPGX2  ETHANOL  PREGNANCY, URINE  PROTIME-INR  APTT  TROPONIN I (HIGH SENSITIVITY)  TROPONIN I (HIGH SENSITIVITY)   ____________________________________________  EKG   EKG Interpretation  Date/Time:  Friday October 11 2021 12:05:09 EST Ventricular Rate:  88 PR Interval:  137 QRS Duration: 83 QT Interval:  354 QTC Calculation: 429 R Axis:   28 Text Interpretation: Sinus rhythm Probable left atrial enlargement Confirmed by Alona Bene [phone]) on 10/11/2021 1:55:57 PM        ____________________________________________  RADIOLOGY  CT HEAD WO CONTRAST  Result Date: 10/11/2021 CLINICAL DATA:  TIA.  Left-sided jaw and arm numbness this morning. EXAM: CT HEAD WITHOUT CONTRAST TECHNIQUE: Contiguous axial images were obtained from the base of the skull through the vertex without intravenous contrast. RADIATION DOSE REDUCTION: This exam was performed according to the departmental dose-optimization program which includes automated exposure control, adjustment of the mA and/or kV according to patient size and/or use of iterative reconstruction technique. COMPARISON:  CT brain 12/28/2020 FINDINGS: Brain: There is mild-to-moderate cortical atrophy, unchanged from prior and within normal limits for patient age. The ventricles are normal in configuration. The basilar cisterns are patent. No mass, mass effect, or midline shift. Unchanged prominent vascular space just lateral to the left basal ganglia (Virchow-Robin space). No acute intracranial hemorrhage is seen. No abnormal extra-axial fluid collection. Preservation of the normal cortical gray-white interface without CT evidence of an acute major vascular territorial cortical based  infarction. Vascular: No hyperdense vessel or unexpected calcification. Skull: Normal. Negative for fracture or focal lesion. Sinuses/Orbits: The visualized orbits are unremarkable. The visualized paranasal sinuses and mastoid air cells are clear. Other: None. IMPRESSION: No acute intracranial process. Electronically Signed   By: Neita Garnet M.D.   On: 10/11/2021 12:56    ____________________________________________   PROCEDURES  Procedure(s) performed:   Procedures  None  ____________________________________________   INITIAL IMPRESSION / ASSESSMENT AND PLAN / ED COURSE  Pertinent labs & imaging results that were available during my care of the patient were reviewed by me and considered in my medical decision making (see chart for details).   This patient is Presenting for Evaluation of numbness, which does require a range of treatment options, and is a complaint that involves a high risk of morbidity and mortality.  The Differential Diagnoses include CVA, ICH, SAH, complex migraine, peripheral neuropathy.  Critical Interventions- migraine cocktail and urgent CT head and labs.    Medications  metoCLOPramide (REGLAN) injection 10 mg (has no administration in time range)  diphenhydrAMINE (BENADRYL) injection 25 mg (has no administration in time range)  ketorolac (TORADOL) 30 MG/ML injection 30 mg (30 mg Intravenous Given 10/11/21 1318)    Reassessment after intervention: HA and symptoms unchanged.    I  decided to review pertinent External Data, and in summary patient with similar presentations in 2021 and 2022.   Clinical Laboratory Tests Ordered, included COVID and flu test which were negative.  Patient with mild leukocytosis.  No anemia.  No acute kidney injury.  Troponin is negative.  Pregnancy negative.  Radiologic Tests Ordered, included CT head. I independently interpreted the images and agree with radiology interpretation.   Cardiac Monitor Tracing which shows  NSR.   Social Determinants of Health Risk patient is not a smoker.  Medical Decision Making: Summary:  Patient presents emergency department with headache and left face/arm symptoms.  Differential includes stroke but patient presented outside the window for tPA.  Her presentation is fairly similar to prior ED presentations.  Complex migraine is in the differential.  She is not hypertensive.  No weakness.  Gave migraine cocktail with no significant relief in symptoms.  CT imaging of the head, individually interpreted, shows no acute findings.  Given no resolution with migraine cocktail have arranged to have the patient transferred to [facility] for MRI.  Dr. Rodena Medin is accepting.   Disposition: transfer for MRI.   ____________________________________________  FINAL CLINICAL IMPRESSION(S) / ED DIAGNOSES  Final diagnoses:  Numbness  Migraine without aura and without status migrainosus, not intractable    Note:  This document was prepared using Dragon voice recognition software and may include unintentional dictation errors.  Alona Bene, MD, [facility]    Witney Huie, Arlyss Repress, MD 10/11/21 1359

## 2021-10-11 NOTE — Telephone Encounter (Signed)
The patient called with concerns over chest pain that occurred yesterday afternoon. She stated that she was at work when she had sudden chest pain that radiated to her right ear. She stated that nothing she did relieved the pain and that it last 15 minutes. She did not go to the ED.  She stated that she is asymptomatic today and denies any chest pain. She has an appointment with DOD on 2/20. Patient made aware of ED precautions should new or worsening symptoms occur. Patient verbalized understanding.

## 2021-10-11 NOTE — ED Notes (Signed)
Pt transferred from [facility] for MRI due to left arm numbness and tingling and left facial tingling. Pt AOx4

## 2021-10-11 NOTE — Discharge Instructions (Addendum)
Continue taking your steroids as prescribed.  You may benefit from following up with a neurosurgeon.  Your MRI did not show a stroke right now.  You likely have complex migraines.  Follow-up with neurologist  Return to ER if you have worse numbness, weakness

## 2021-10-11 NOTE — ED Notes (Signed)
Pt discharged and ambulated out of the ED without difficulty. 

## 2021-10-11 NOTE — ED Notes (Signed)
[facility] into transport 

## 2021-10-11 NOTE — ED Triage Notes (Signed)
First contact with patient. Patient arrived via triage with complaints of left sided jaw numbness and left arm numbness x 0930 today. No facial droop noted. Equil grips noted. Patient states she "just feels heavy" on the left. Patient is A&OX 4. Respirations even/unlabored.

## 2021-10-14 ENCOUNTER — Ambulatory Visit: Payer: 59 | Admitting: Cardiology

## 2021-10-17 DIAGNOSIS — H524 Presbyopia: Secondary | ICD-10-CM | POA: Diagnosis not present

## 2021-10-17 DIAGNOSIS — H04123 Dry eye syndrome of bilateral lacrimal glands: Secondary | ICD-10-CM | POA: Diagnosis not present

## 2021-10-22 ENCOUNTER — Other Ambulatory Visit (HOSPITAL_COMMUNITY): Payer: Self-pay

## 2021-10-22 DIAGNOSIS — K589 Irritable bowel syndrome without diarrhea: Secondary | ICD-10-CM | POA: Diagnosis not present

## 2021-10-22 DIAGNOSIS — J45909 Unspecified asthma, uncomplicated: Secondary | ICD-10-CM | POA: Diagnosis not present

## 2021-10-22 DIAGNOSIS — U099 Post covid-19 condition, unspecified: Secondary | ICD-10-CM | POA: Diagnosis not present

## 2021-10-22 DIAGNOSIS — M8580 Other specified disorders of bone density and structure, unspecified site: Secondary | ICD-10-CM | POA: Diagnosis not present

## 2021-10-22 DIAGNOSIS — M0609 Rheumatoid arthritis without rheumatoid factor, multiple sites: Secondary | ICD-10-CM | POA: Diagnosis not present

## 2021-10-22 DIAGNOSIS — Z79899 Other long term (current) drug therapy: Secondary | ICD-10-CM | POA: Diagnosis not present

## 2021-10-22 DIAGNOSIS — M791 Myalgia, unspecified site: Secondary | ICD-10-CM | POA: Diagnosis not present

## 2021-10-22 DIAGNOSIS — R5383 Other fatigue: Secondary | ICD-10-CM | POA: Diagnosis not present

## 2021-10-22 DIAGNOSIS — M2550 Pain in unspecified joint: Secondary | ICD-10-CM | POA: Diagnosis not present

## 2021-10-22 MED ORDER — LISDEXAMFETAMINE DIMESYLATE 70 MG PO CAPS
ORAL_CAPSULE | ORAL | 0 refills | Status: DC
  Filled 2021-10-22: qty 30, 30d supply, fill #0

## 2021-10-28 ENCOUNTER — Other Ambulatory Visit (HOSPITAL_COMMUNITY): Payer: Self-pay

## 2021-10-30 ENCOUNTER — Other Ambulatory Visit: Payer: Self-pay | Admitting: Adult Health

## 2021-10-30 DIAGNOSIS — M542 Cervicalgia: Secondary | ICD-10-CM | POA: Diagnosis not present

## 2021-10-30 DIAGNOSIS — M6281 Muscle weakness (generalized): Secondary | ICD-10-CM | POA: Diagnosis not present

## 2021-10-30 DIAGNOSIS — M2560 Stiffness of unspecified joint, not elsewhere classified: Secondary | ICD-10-CM | POA: Diagnosis not present

## 2021-11-01 ENCOUNTER — Other Ambulatory Visit (HOSPITAL_COMMUNITY): Payer: Self-pay

## 2021-11-06 ENCOUNTER — Other Ambulatory Visit (HOSPITAL_COMMUNITY): Payer: Self-pay

## 2021-11-20 ENCOUNTER — Other Ambulatory Visit (HOSPITAL_COMMUNITY): Payer: Self-pay

## 2021-11-23 ENCOUNTER — Other Ambulatory Visit (HOSPITAL_COMMUNITY): Payer: Self-pay

## 2021-11-23 MED ORDER — LISDEXAMFETAMINE DIMESYLATE 70 MG PO CAPS
70.0000 mg | ORAL_CAPSULE | Freq: Every morning | ORAL | 0 refills | Status: DC
  Filled 2021-11-23: qty 30, 30d supply, fill #0

## 2021-12-04 DIAGNOSIS — M0609 Rheumatoid arthritis without rheumatoid factor, multiple sites: Secondary | ICD-10-CM | POA: Diagnosis not present

## 2021-12-12 ENCOUNTER — Ambulatory Visit: Payer: 59 | Admitting: Adult Health

## 2021-12-12 NOTE — Progress Notes (Deleted)
[facility] [address] [place] [address] [phone]       OFFICE FOLLOW UP VISIT NOTE  [name] Date of Birth:  [birth year] Medical Record Number:  [id number]    Primary neurologist: Dr. Leonie Man Referring MD: Lynwood Dawley, [name] Reason for Referral: Stroke like episode   HPI:   Initial visit 10/03/2020  :Natalie Ford is a 51 year old Caucasian lady seen today for initial office consultation visit for strokelike episode.  History is obtained from the patient, review of electronic medical records and I personally reviewed available imaging films in PACS.  She has past medical history of hyperlipidemia, dyspepsia, irritable bowel syndrome, COVID-19 infection who presented on 08/05/2020 with sudden onset of right-sided weakness numbness right upper quadrant vision difficulties and spinning sensation.  His symptoms initially improved but she continued to have focal deficits since she was given IV TPA after discussion of risk benefit.  Her symptoms resolved after that.  She was kept in the neurological intensive care unit and blood pressure tightly controlled.  Subsequently in the CT angiogram and head and neck were obtained prior to TPA which were unremarkable.  Subsequently MRI scan of the brain was obtained which was normal.  2D echo showed ejection fraction of 55 to 60% without cardiac source of embolism.  LDL cholesterol was elevated at 1 1 7  mg percent and hemoglobin A1c was 4.8.  She is started on aspirin and diet controlled for lipids.  Patient states that she has done well since discharge she has had no recurrent episodes.  She has had a lot of muscle aches and pains and has been diagnosed with rheumatoid arthritis and is planning on starting Enbrel soon.  She continues to have some blurred vision and has seen an ophthalmologist was prescribed Eysuvis eyedrops which she is using.  She also feels easy fatigability and feels a lot of brain fog.  She  has no significant vascular risk factors except hyperlipidemia.  She is tolerating aspirin well without bruising or bleeding.  She has no new complaints.  She denies any prior history of migraine headaches similar episodes in the past.  Update 05/14/21 : She returns for follow-up after last visit 7 months ago.  Patient was seen in the ER on 12/28/2020 with headaches and fleeting left-sided paresthesias.  CT head was negative for acute abnormality.  She was given headache cocktail and symptoms resolved.  Patient was diagnosed with COVID infection was the third time on September 6 and is recovering from that.  She continues to have migraine headaches which occur about 3-4 times a month.  She has been started on Inderal 30 mg twice daily for tachycardia which she has had post-COVID.  Her heart rate is still elevated at 95 today.  She is also been diagnosed with rheumatoid arthritis and started on Orencia monthly injections.  She complains of occasional pins-and-needles in the feet at night but these are not bothersome or frequent.  Patient does not want any work-up for peripheral neuropathy at this time.  She denies any difficulty with balance or gait weakness.  She remains on aspirin which is tolerating well without bruising or bleeding and Crestor which is tolerating without muscle aches and pains.   Update 08/13/2021 JM: Returns for follow-up visit after prior visit 3 months ago with Dr. Leonie Man for prior strokelike episode possibly complicated migraine.  During the interval time, propranolol further increased to 60 mg twice daily with some improvement. Use of Fioricet for emergent relief  but unable to tolerate due to increased fatigue after taking.  Currently experiencing almost daily dull tension type headaches in approximately 2-3 more severe headaches per month associated with phonophobia.  Denies photophobia or N/V.  Daily use of Tylenol in the morning as she will frequently awaken with headache.  Prior sleep  study 10/2020 negative for sleep apnea. She believes headaches have been worse since COVID back in September. She also gets Orencia infusions for RA with bad headache after.  Denies new or reoccurring strokelike symptoms.  Compliant on aspirin and Crestor without side effects.  Blood pressure today 115/77.  TCD unremarkable.  No further concerns at this time  Update 12/12/2021 [name] for 55-month stroke follow-up.  Since prior visit, she presented to ED on 2/17 with headache and left face and arm numbness similar to prior episode in 12/2020.  She received migraine cocktail but no significant relief of symptoms therefore proceeded with CT head which was unremarkable and transferred to [facility] for MRI imaging.  MRI brain no acute abnormality.  Cervical imaging showed degenerative changes and small right disc protrusion at C4-5 with resultant mild flattening of right hemicord but no cord signal changes or significant stenosis.  She was referred to neurosurgery for follow-up.  Stable since that time without new stroke/TIA symptoms.  She has remained on propranolol 40 mg 3 times daily for migraine prophylaxis as well as Nurtec for abortive therapy.  Compliant on aspirin and Crestor, denies side effects.  Blood pressure today ***.     ROS:   14 system review of systems is positive for those listed in HPI and pains and all other systems negative  PMH:  Past Medical History:  Diagnosis Date   Allergic rhinitis    COVID-19    x 3   Dyspepsia    Hypercholesteremia    IBS (irritable bowel syndrome)    Lumbosacral strain    RA (rheumatoid arthritis) ([place])    Stroke ([place]) 08/05/2020   tia    Social History:  Social History   Socioeconomic History   Marital status: Married    Spouse name: Not on file   Number of children: 2   Years of education: Not on file   Highest education level: Not on file  Occupational History   Occupation: Therapist, sports, Multimedia programmer: [employer]    Occupation: Therapist, sports, Copywriter, advertising    Comment: [employer]  Tobacco Use   Smoking status: Never   Smokeless tobacco: Never   Tobacco comments:    exposed to second hand smoke  Vaping Use   Vaping Use: Never used  Substance and Sexual Activity   Alcohol use: Yes    Comment: social use   Drug use: No   Sexual activity: Not on file  Other Topics Concern   Not on file  Social History Narrative   Lives with 2 children and husband   Right Handed   Drinks 1-2 cups caffeine daily   Social Determinants of Health   Financial Resource Strain: Not on file  Food Insecurity: Not on file  Transportation Needs: Not on file  Physical Activity: Not on file  Stress: Not on file  Social Connections: Not on file  Intimate Partner Violence: Not on file    Medications:   Current Outpatient Medications on File Prior to Visit  Medication Sig Dispense Refill   abatacept (ORENCIA) 250 MG injection Use as directed Intravenous 500 mg every 28 days 2 each 5  albuterol (VENTOLIN HFA) 108 (90 Base) MCG/ACT inhaler Inhale 1-2 puffs into the lungs every 6 (six) hours as needed for wheezing or shortness of breath. 18 g 5   amphetamine-dextroamphetamine (ADDERALL) 20 MG tablet Take 1 tablet by mouth every morning 30 tablet 0   aspirin EC 81 MG EC tablet Take 1 tablet (81 mg total) by mouth daily. Swallow whole. 30 tablet 11   buPROPion (WELLBUTRIN XL) 150 MG 24 hr tablet 3 tablets     cycloSPORINE, PF, (CEQUA) 0.09 % SOLN Place 1 drop into both eyes 2 times a day 180 each 3   fluticasone (FLONASE) 50 MCG/ACT nasal spray Place 2 sprays into both nostrils daily.     fluticasone-salmeterol (ADVAIR HFA) 230-21 MCG/ACT inhaler Inhale 2 puffs into the lungs 2 (two) times daily. 1 Inhaler 12   gabapentin (NEURONTIN) 300 MG capsule Take 300-600 mg by mouth 3 (three) times daily as needed.     ibuprofen (ADVIL,MOTRIN) 200 MG tablet Take 200 mg by mouth every 6 (six) hours as needed for headache or moderate pain.      lisdexamfetamine (VYVANSE) 70 MG capsule Take 1 capsule by mouth every morning. 30 capsule 0   lisdexamfetamine (VYVANSE) 70 MG capsule Take 1 capsule (70 mg total) by mouth every morning. 30 capsule 0   Multiple Vitamin (MULTIVITAMIN) capsule Take 1 capsule by mouth at bedtime.     NURTEC 75 MG TBDP PLACE ONE TABLET BY MOUTH DAILY AS NEEDED 8 tablet 2   Omega-3 Fatty Acids (FISH OIL) 1000 MG CAPS Take 2 capsules by mouth daily.     predniSONE (DELTASONE) 1 MG tablet Take 4 tablets (4 mg total) by mouth daily with breakfast. 120 tablet 3   propranolol (INDERAL) 40 MG tablet Take 1 tablet (40 mg total) by mouth 3 (three) times daily. 90 tablet 2   rosuvastatin (CRESTOR) 10 MG tablet Take 10 mg by mouth daily.     spironolactone (ALDACTONE) 50 MG tablet Take 75 mg by mouth daily.     tiZANidine (ZANAFLEX) 2 MG tablet Take 1 tablet (2 mg total) by mouth at bedtime. 30 tablet 5   No current facility-administered medications on file prior to visit.    Allergies:   Allergies  Allergen Reactions   Penicillins     rash    Physical Exam There were no vitals filed for this visit.  There is no height or weight on file to calculate BMI.   General: Pleasant well developed, well nourished very pleasant middle-aged Caucasian lady, seated, in no evident distress Head: head normocephalic and atraumatic.   Neck: supple with no carotid or supraclavicular bruits Cardiovascular: regular rate and rhythm, no murmurs Musculoskeletal: no deformity Skin:  no rash/petichiae Vascular:  Normal pulses all extremities  Neurologic Exam Mental Status: Awake and fully alert. Oriented to place and time. Recent and remote memory intact. Attention span, concentration and fund of knowledge appropriate. Mood and affect appropriate.  Cranial Nerves: Pupils equal, briskly reactive to light. Extraocular movements full without nystagmus. Visual fields full to confrontation. Hearing intact. Facial sensation intact. Face,  tongue, palate moves normally and symmetrically.  Motor: Normal bulk and tone. Normal strength in all tested extremity muscles. Sensory.: intact to touch , pinprick , position and vibratory sensation.  Coordination: Rapid alternating movements normal in all extremities. Finger-to-nose and heel-to-shin performed accurately bilaterally. Gait and Station: Arises from chair without difficulty. Stance is normal. Gait demonstrates normal stride length and balance . Able to heel, toe and  tandem walk without difficulty.  Reflexes: 1+ and symmetric. Toes downgoing.       ASSESSMENT/PLAN: 51 year old Caucasian lady with transient episode of right-sided weakness numbness, spinning sensation and vision deficits nightly stroke like episode possibly complicated migraine treated with IV TPA with full recovery.  Brain imaging negative for acute stroke.  Vascular risk factors of hyperlipidemia only.  Episode of headache with fleeting left-sided paresthesias possibly complicated migraine episode in May 2022 and similar episode 09/2021. Continued daily tension type headaches with possible migrainous type headache 2-3 times per month    -Recommend starting tizanidine 2 mg nightly - continue for at least 1 week and if no benefit can consider dosage increase -Recommend trialing Nurtec 75 mg daily as needed for emergent relief - samples provided -Would not recommend triptan due to strokelike episode s/p tPA -Continue propranolol 60 mg twice daily - would not recommend further increase due to current BP and HR -Hesitant to trial antidepressant such as tricyclic due to current use of high-dose bupropion -Current use of gabapentin for dysesthesias managed by PCP -Limit use of Tylenol due to rebound headaches -Continue aspirin 81 mg daily and Crestor 10 mg daily for secondary stroke prevention measures   Follow-up in 4 months or call earlier if needed    CC:  Natalie Pretty, MD   I spent 34 minutes of  face-to-face and non-face-to-face time with patient.  This included previsit chart review, lab review, study review, order entry, electronic health record documentation, patient education and discussion regarding prior strokelike episodes possibly complicated migraine, further medication use and headache prevention measures and answered all other questions to patient satisfaction  Frann Rider, AGNP-BC  [facility] [address] [place], [place] [address]  Phone [phone] Fax [phone] Note: This document was prepared with digital dictation and possible smart phrase technology. Any transcriptional errors that result from this process are unintentional.

## 2021-12-19 ENCOUNTER — Other Ambulatory Visit: Payer: Self-pay

## 2021-12-19 ENCOUNTER — Emergency Department (HOSPITAL_BASED_OUTPATIENT_CLINIC_OR_DEPARTMENT_OTHER)
Admission: EM | Admit: 2021-12-19 | Discharge: 2021-12-19 | Disposition: A | Discharge status: Home / Self Care | Payer: 59 | Attending: Emergency Medicine | Admitting: Emergency Medicine

## 2021-12-19 ENCOUNTER — Other Ambulatory Visit: Payer: Self-pay | Admitting: Adult Health

## 2021-12-19 ENCOUNTER — Encounter (HOSPITAL_BASED_OUTPATIENT_CLINIC_OR_DEPARTMENT_OTHER): Payer: Self-pay

## 2021-12-19 DIAGNOSIS — Z7982 Long term (current) use of aspirin: Secondary | ICD-10-CM | POA: Insufficient documentation

## 2021-12-19 DIAGNOSIS — R591 Generalized enlarged lymph nodes: Secondary | ICD-10-CM | POA: Insufficient documentation

## 2021-12-19 DIAGNOSIS — R599 Enlarged lymph nodes, unspecified: Secondary | ICD-10-CM | POA: Diagnosis not present

## 2021-12-19 DIAGNOSIS — M542 Cervicalgia: Secondary | ICD-10-CM | POA: Diagnosis not present

## 2021-12-19 MED ORDER — NAPROXEN 375 MG PO TABS: 375.0000 mg | ORAL_TABLET | Freq: Two times a day (BID) | ORAL | 0 refills | Status: DC

## 2021-12-19 MED ORDER — NAPROXEN 250 MG PO TABS
500.0000 mg | ORAL_TABLET | Freq: Once | ORAL | Status: AC
Start: 2021-12-19 — End: 2021-12-19
  Administered 2021-12-19: 500 mg via ORAL
  Filled 2021-12-19: qty 2

## 2021-12-19 MED ORDER — DOXYCYCLINE HYCLATE 100 MG PO TABS: 100.0000 mg | ORAL_TABLET | Freq: Two times a day (BID) | ORAL | 0 refills | Status: DC

## 2021-12-19 MED ORDER — HYDROCODONE-ACETAMINOPHEN 5-325 MG PO TABS: 1.0000 | ORAL_TABLET | Freq: Four times a day (QID) | ORAL | 0 refills | Status: DC | PRN

## 2021-12-19 NOTE — Discharge Instructions (Addendum)
Start the doxycycline medication if you do not feel that your symptoms are improving in the next couple days, try applying warm compresses to the neck and area.  Take the medications as needed for pain.  Follow-up with your doctor to be rechecked if the symptoms have not resolved in the next week.  Return to emergency room if start experiencing high fevers or other concerning symptoms ?

## 2021-12-19 NOTE — ED Triage Notes (Signed)
Pt endorses her neck started hurting 4 days ago, like she slept wrong. Took Ibuprofen with some relief, but symptoms persist. Today, patient noticed a knot on the left side of her neck behind her ear that is tender and warm to touch. The pain extends into the left side of her face, left ear, and jaw.  ? ?Currently taking Augmentin for Bronchitis.  ?

## 2021-12-19 NOTE — ED Provider Notes (Signed)
?[facility] EMERGENCY DEPARTMENT ?Provider Note ? ? ?CSN: [id number] ?Arrival date & time: 12/19/21  1932 ? ?  ? ?History ? ?Chief Complaint  ?Patient presents with  ? Neck Pain  ? ? ?Natalie Ford is a 51 y.o. female. ? ? ?Neck Pain ?Associated symptoms: no fever   ? ?Patient states she was diagnosed with bronchitis recently and has finished up a course of Augmentin for that.  Patient states couple days ago she started experiencing pain and discomfort behind her left ear and neck.  Patient feels that the area is swollen back there.  The pain goes from that area down towards her jaw.  It hurts when she moves her mouth.  She is not having any tooth ache however.  She is not having any sore throat.  She has not had any difficulty swallowing.  No headache.  Patient has tried over-the-counter medications without relief. ? ?Home Medications ?Prior to Admission medications   ?Medication Sig Start Date End Date Taking? Authorizing Provider  ?doxycycline (VIBRA-TABS) 100 MG tablet Take 1 tablet (100 mg total) by mouth 2 (two) times daily. 12/19/21  Yes Linwood Dibbles, MD  ?HYDROcodone-acetaminophen (NORCO/VICODIN) 5-325 MG tablet Take 1 tablet by mouth every 6 (six) hours as needed for severe pain. 12/19/21  Yes Linwood Dibbles, MD  ?naproxen (NAPROSYN) 375 MG tablet Take 1 tablet (375 mg total) by mouth 2 (two) times daily. 12/19/21  Yes Linwood Dibbles, MD  ?abatacept [place]) 250 MG injection Use as directed Intravenous 500 mg every 28 days 02/21/21   Quentin Angst, MD  ?albuterol (VENTOLIN HFA) 108 (90 Base) MCG/ACT inhaler Inhale 1-2 puffs into the lungs every 6 (six) hours as needed for wheezing or shortness of breath. 12/20/19   Charlott Holler, MD  ?amphetamine-dextroamphetamine (ADDERALL) 20 MG tablet Take 1 tablet by mouth every morning 09/24/21   Cottle, Steva Ready., MD  ?aspirin EC 81 MG EC tablet Take 1 tablet (81 mg total) by mouth daily. Swallow whole. 08/08/20   Rinehuls, Kinnie Scales, [name]  ?buPROPion  (WELLBUTRIN XL) 150 MG 24 hr tablet 3 tablets    [provider]  ?cycloSPORINE, PF, (CEQUA) 0.09 % SOLN Place 1 drop into both eyes 2 times a day 04/04/21     ?fluticasone (FLONASE) 50 MCG/ACT nasal spray Place 2 sprays into both nostrils daily.    [provider]  ?fluticasone-salmeterol (ADVAIR HFA) 230-21 MCG/ACT inhaler Inhale 2 puffs into the lungs 2 (two) times daily. 01/26/20   Charlott Holler, MD  ?gabapentin (NEURONTIN) 300 MG capsule Take 300-600 mg by mouth 3 (three) times daily as needed. 02/24/20   [provider]  ?lisdexamfetamine (VYVANSE) 70 MG capsule Take 1 capsule by mouth every morning. 08/22/21   Cottle, Steva Ready., MD  ?lisdexamfetamine (VYVANSE) 70 MG capsule Take 1 capsule (70 mg total) by mouth every morning. 11/21/21   Cottle, Steva Ready., MD  ?Multiple Vitamin (MULTIVITAMIN) capsule Take 1 capsule by mouth at bedtime.    [provider]  ?NURTEC 75 MG TBDP PLACE ONE TABLET BY MOUTH DAILY AS NEEDED 10/31/21   Ihor Austin, NP  ?Omega-3 Fatty Acids (FISH OIL) 1000 MG CAPS Take 2 capsules by mouth daily.    [provider]  ?predniSONE (DELTASONE) 1 MG tablet Take 4 tablets (4 mg total) by mouth daily with breakfast. 08/29/21   Raulkar, Drema Pry, MD  ?propranolol (INDERAL) 40 MG tablet Take 1 tablet (40 mg total) by mouth 3 (three)  times daily. 09/16/21   Ihor Austin, NP  ?rosuvastatin (CRESTOR) 10 MG tablet Take 10 mg by mouth daily.    [provider]  ?spironolactone (ALDACTONE) 50 MG tablet Take 75 mg by mouth daily. 03/06/20   [provider]  ?tiZANidine (ZANAFLEX) 2 MG tablet Take 1 tablet (2 mg total) by mouth at bedtime. 08/13/21   Ihor Austin, NP  ?   ? ?Allergies    ?Penicillins   ? ?Review of Systems   ?Review of Systems  ?Constitutional:  Negative for fever.  ?Musculoskeletal:  Positive for neck pain.  ? ?Physical Exam ?Updated Vital Signs ?BP 128/82 (BP Location: Right Arm)   Pulse 79   Temp 98 ?F (36.7 ?C) (Oral)    Resp 16   Ht 1.524 m (5')   Wt 54.4 kg   LMP 08/08/2021 (Approximate)   SpO2 100%   BMI 23.44 kg/m?  ?Physical Exam ?Vitals and nursing note reviewed.  ?Constitutional:   ?   General: She is not in acute distress. ?   Appearance: She is well-developed.  ?HENT:  ?   Head: Normocephalic and atraumatic.  ?   Right Ear: Tympanic membrane, ear canal and external ear normal. There is no impacted cerumen.  ?   Left Ear: Tympanic membrane, ear canal and external ear normal. There is no impacted cerumen.  ?   Mouth/Throat:  ?   Mouth: Mucous membranes are moist.  ?   Pharynx: No oropharyngeal exudate or posterior oropharyngeal erythema.  ?Eyes:  ?   General: No scleral icterus.    ?   Right eye: No discharge.     ?   Left eye: No discharge.  ?   Conjunctiva/sclera: Conjunctivae normal.  ?Neck:  ?   Trachea: No tracheal deviation.  ?Cardiovascular:  ?   Rate and Rhythm: Normal rate.  ?Pulmonary:  ?   Effort: Pulmonary effort is normal. No respiratory distress.  ?   Breath sounds: No stridor.  ?Abdominal:  ?   General: There is no distension.  ?Musculoskeletal:     ?   General: No swelling or deformity.  ?   Cervical back: Neck supple.  ?Lymphadenopathy:  ?   Comments: Mild posterior auricular lymphadenopathy noted behind the left ear, no anterior chain lymphadenopathy  ?Skin: ?   General: Skin is warm and dry.  ?   Findings: No rash.  ?Neurological:  ?   Mental Status: She is alert.  ?   Cranial Nerves: Cranial nerve deficit: no gross deficits.  ? ? ?ED Results / Procedures / Treatments   ?Labs ?(all labs ordered are listed, but only abnormal results are displayed) ?Labs Reviewed - No data to display ? ?EKG ?None ? ?Radiology ?No results found. ? ?Procedures ?Procedures  ? ? ?Medications Ordered in ED ?Medications  ?naproxen (NAPROSYN) tablet 500 mg (500 mg Oral Given 12/19/21 2023)  ? ? ?ED Course/ Medical Decision Making/ A&P ?  ?                        ?Medical Decision Making ?Risk ?Prescription drug  management. ? ? ?No signs of otitis media.  I doubt mastoiditis without any erythema noted.  She does not have any evidence of pharyngitis.  Patient denies any dental pain.  Symptoms not necessarily suggestive of TMJ syndrome with the tenderness being more posterior to the ear.  Patient does have lymphadenopathy and tenderness in that area.  Is possible this is viral.  Patient is already on Augmentin.  We will give a prescription for an alternative antibiotic although I recommend she try see if the symptoms improve in the next couple of days.  We will also prescribe medications for pain.  Recommend warm compresses follow-up with primary doctor and return to the ER for fevers worsening symptoms ? ? ? ? ? ? ? ?Final Clinical Impression(s) / ED Diagnoses ?Final diagnoses:  ?Lymphadenopathy  ? ? ?Rx / DC Orders ?ED Discharge Orders   ? ?      Ordered  ?  naproxen (NAPROSYN) 375 MG tablet  2 times daily       ? 12/19/21 2023  ?  doxycycline (VIBRA-TABS) 100 MG tablet  2 times daily       ? 12/19/21 2023  ?  HYDROcodone-acetaminophen (NORCO/VICODIN) 5-325 MG tablet  Every 6 hours PRN       ? 12/19/21 2023  ? ?  ?  ? ?  ? ? ?  ?Linwood DibblesKnapp, Shaneice Barsanti, MD ?12/19/21 2027 ? ?

## 2021-12-24 DIAGNOSIS — U099 Post covid-19 condition, unspecified: Secondary | ICD-10-CM | POA: Diagnosis not present

## 2021-12-24 DIAGNOSIS — M0609 Rheumatoid arthritis without rheumatoid factor, multiple sites: Secondary | ICD-10-CM | POA: Diagnosis not present

## 2021-12-24 DIAGNOSIS — J45909 Unspecified asthma, uncomplicated: Secondary | ICD-10-CM | POA: Diagnosis not present

## 2021-12-24 DIAGNOSIS — Z6821 Body mass index (BMI) 21.0-21.9, adult: Secondary | ICD-10-CM | POA: Diagnosis not present

## 2021-12-24 DIAGNOSIS — L659 Nonscarring hair loss, unspecified: Secondary | ICD-10-CM | POA: Diagnosis not present

## 2021-12-24 DIAGNOSIS — R5382 Chronic fatigue, unspecified: Secondary | ICD-10-CM | POA: Diagnosis not present

## 2021-12-24 DIAGNOSIS — E041 Nontoxic single thyroid nodule: Secondary | ICD-10-CM | POA: Diagnosis not present

## 2021-12-27 ENCOUNTER — Other Ambulatory Visit (HOSPITAL_COMMUNITY): Payer: Self-pay

## 2021-12-27 MED ORDER — LISDEXAMFETAMINE DIMESYLATE 70 MG PO CAPS
ORAL_CAPSULE | ORAL | 0 refills | Status: DC
  Filled 2021-12-27: qty 30, 30d supply, fill #0

## 2022-01-06 NOTE — Telephone Encounter (Signed)
Error

## 2022-01-08 DIAGNOSIS — M0609 Rheumatoid arthritis without rheumatoid factor, multiple sites: Secondary | ICD-10-CM | POA: Diagnosis not present

## 2022-01-25 ENCOUNTER — Other Ambulatory Visit (HOSPITAL_COMMUNITY): Payer: Self-pay

## 2022-01-25 MED ORDER — LISDEXAMFETAMINE DIMESYLATE 70 MG PO CAPS
70.0000 mg | ORAL_CAPSULE | Freq: Every morning | ORAL | 0 refills | Status: DC
  Filled 2022-01-25: qty 30, 30d supply, fill #0

## 2022-01-29 DIAGNOSIS — M0609 Rheumatoid arthritis without rheumatoid factor, multiple sites: Secondary | ICD-10-CM | POA: Diagnosis not present

## 2022-01-29 DIAGNOSIS — R5383 Other fatigue: Secondary | ICD-10-CM | POA: Diagnosis not present

## 2022-01-29 DIAGNOSIS — Z79899 Other long term (current) drug therapy: Secondary | ICD-10-CM | POA: Diagnosis not present

## 2022-01-29 DIAGNOSIS — M791 Myalgia, unspecified site: Secondary | ICD-10-CM | POA: Diagnosis not present

## 2022-01-29 DIAGNOSIS — K589 Irritable bowel syndrome without diarrhea: Secondary | ICD-10-CM | POA: Diagnosis not present

## 2022-01-29 DIAGNOSIS — M8580 Other specified disorders of bone density and structure, unspecified site: Secondary | ICD-10-CM | POA: Diagnosis not present

## 2022-01-29 DIAGNOSIS — H04123 Dry eye syndrome of bilateral lacrimal glands: Secondary | ICD-10-CM | POA: Diagnosis not present

## 2022-01-29 DIAGNOSIS — J45909 Unspecified asthma, uncomplicated: Secondary | ICD-10-CM | POA: Diagnosis not present

## 2022-01-29 DIAGNOSIS — M2550 Pain in unspecified joint: Secondary | ICD-10-CM | POA: Diagnosis not present

## 2022-02-12 DIAGNOSIS — M0609 Rheumatoid arthritis without rheumatoid factor, multiple sites: Secondary | ICD-10-CM | POA: Diagnosis not present

## 2022-02-18 ENCOUNTER — Other Ambulatory Visit: Payer: Self-pay | Admitting: Adult Health

## 2022-02-18 ENCOUNTER — Other Ambulatory Visit: Payer: Self-pay | Admitting: Physical Medicine and Rehabilitation

## 2022-02-22 ENCOUNTER — Other Ambulatory Visit (HOSPITAL_COMMUNITY): Payer: Self-pay

## 2022-02-22 MED ORDER — LISDEXAMFETAMINE DIMESYLATE 70 MG PO CAPS
70.0000 mg | ORAL_CAPSULE | Freq: Every morning | ORAL | 0 refills | Status: DC
  Filled 2022-02-22: qty 90, 90d supply, fill #0

## 2022-02-22 MED ORDER — PROPRANOLOL HCL 40 MG PO TABS
40.0000 mg | ORAL_TABLET | Freq: Three times a day (TID) | ORAL | 0 refills | Status: DC
  Filled 2022-02-22: qty 9, 3d supply, fill #0
  Filled 2022-02-22: qty 261, 87d supply, fill #0

## 2022-02-24 ENCOUNTER — Other Ambulatory Visit (HOSPITAL_COMMUNITY): Payer: Self-pay

## 2022-02-26 ENCOUNTER — Other Ambulatory Visit (HOSPITAL_BASED_OUTPATIENT_CLINIC_OR_DEPARTMENT_OTHER): Payer: Self-pay

## 2022-02-26 ENCOUNTER — Other Ambulatory Visit (HOSPITAL_COMMUNITY): Payer: Self-pay

## 2022-02-27 ENCOUNTER — Encounter: Payer: 59 | Admitting: Physical Medicine and Rehabilitation

## 2022-03-03 ENCOUNTER — Telehealth: Payer: Self-pay | Admitting: *Deleted

## 2022-03-03 ENCOUNTER — Other Ambulatory Visit (HOSPITAL_COMMUNITY): Payer: Self-pay

## 2022-03-03 NOTE — Telephone Encounter (Signed)
[facility] PA, Key: [id number]. Your information has been sent to [facility].

## 2022-03-03 NOTE — Telephone Encounter (Signed)
[facility]  approved maximum of 12 fill(s) from 03/03/2022 to 03/03/2023. Approval letter faxed to pharmacy.

## 2022-03-12 DIAGNOSIS — M0609 Rheumatoid arthritis without rheumatoid factor, multiple sites: Secondary | ICD-10-CM | POA: Diagnosis not present

## 2022-04-05 ENCOUNTER — Other Ambulatory Visit: Payer: Self-pay | Admitting: Adult Health

## 2022-04-21 ENCOUNTER — Other Ambulatory Visit: Payer: Self-pay | Admitting: Psychiatry

## 2022-04-22 ENCOUNTER — Encounter: Payer: 59 | Admitting: Physical Medicine and Rehabilitation

## 2022-05-01 DIAGNOSIS — H04123 Dry eye syndrome of bilateral lacrimal glands: Secondary | ICD-10-CM | POA: Diagnosis not present

## 2022-05-01 DIAGNOSIS — M8580 Other specified disorders of bone density and structure, unspecified site: Secondary | ICD-10-CM | POA: Diagnosis not present

## 2022-05-01 DIAGNOSIS — M0609 Rheumatoid arthritis without rheumatoid factor, multiple sites: Secondary | ICD-10-CM | POA: Diagnosis not present

## 2022-05-01 DIAGNOSIS — M79642 Pain in left hand: Secondary | ICD-10-CM | POA: Diagnosis not present

## 2022-05-01 DIAGNOSIS — M79641 Pain in right hand: Secondary | ICD-10-CM | POA: Diagnosis not present

## 2022-05-01 DIAGNOSIS — M2550 Pain in unspecified joint: Secondary | ICD-10-CM | POA: Diagnosis not present

## 2022-05-01 DIAGNOSIS — M79672 Pain in left foot: Secondary | ICD-10-CM | POA: Diagnosis not present

## 2022-05-01 DIAGNOSIS — Z79899 Other long term (current) drug therapy: Secondary | ICD-10-CM | POA: Diagnosis not present

## 2022-05-01 DIAGNOSIS — J45909 Unspecified asthma, uncomplicated: Secondary | ICD-10-CM | POA: Diagnosis not present

## 2022-05-01 DIAGNOSIS — R5383 Other fatigue: Secondary | ICD-10-CM | POA: Diagnosis not present

## 2022-05-01 DIAGNOSIS — M791 Myalgia, unspecified site: Secondary | ICD-10-CM | POA: Diagnosis not present

## 2022-05-01 DIAGNOSIS — M79671 Pain in right foot: Secondary | ICD-10-CM | POA: Diagnosis not present

## 2022-05-01 DIAGNOSIS — K589 Irritable bowel syndrome without diarrhea: Secondary | ICD-10-CM | POA: Diagnosis not present

## 2022-05-16 DIAGNOSIS — M1990 Unspecified osteoarthritis, unspecified site: Secondary | ICD-10-CM | POA: Diagnosis not present

## 2022-05-16 DIAGNOSIS — L659 Nonscarring hair loss, unspecified: Secondary | ICD-10-CM | POA: Diagnosis not present

## 2022-05-19 ENCOUNTER — Telehealth: Payer: Self-pay | Admitting: Pharmacist

## 2022-05-19 ENCOUNTER — Other Ambulatory Visit (HOSPITAL_COMMUNITY): Payer: Self-pay

## 2022-05-19 MED ORDER — RINVOQ 15 MG PO TB24: 1.0000 | ORAL_TABLET | Freq: Every day | ORAL | 3 refills | Status: DC

## 2022-05-19 NOTE — Telephone Encounter (Signed)
Called patient to schedule an appointment for the [place] [facility]. I was unable to reach the patient so I left a HIPAA-compliant message requesting that the patient return my call.   Luke Van Ausdall, PharmD, BCACP, CPP Clinical Pharmacist [facility] [phone]  

## 2022-05-20 ENCOUNTER — Other Ambulatory Visit (HOSPITAL_COMMUNITY): Payer: Self-pay

## 2022-05-23 DIAGNOSIS — Z0000 Encounter for general adult medical examination without abnormal findings: Secondary | ICD-10-CM | POA: Diagnosis not present

## 2022-05-23 DIAGNOSIS — E041 Nontoxic single thyroid nodule: Secondary | ICD-10-CM | POA: Diagnosis not present

## 2022-05-23 DIAGNOSIS — J45909 Unspecified asthma, uncomplicated: Secondary | ICD-10-CM | POA: Diagnosis not present

## 2022-05-23 DIAGNOSIS — E7800 Pure hypercholesterolemia, unspecified: Secondary | ICD-10-CM | POA: Diagnosis not present

## 2022-05-23 DIAGNOSIS — R7989 Other specified abnormal findings of blood chemistry: Secondary | ICD-10-CM | POA: Diagnosis not present

## 2022-05-23 DIAGNOSIS — R739 Hyperglycemia, unspecified: Secondary | ICD-10-CM | POA: Diagnosis not present

## 2022-05-27 ENCOUNTER — Other Ambulatory Visit (HOSPITAL_COMMUNITY): Payer: Self-pay

## 2022-05-27 DIAGNOSIS — E7800 Pure hypercholesterolemia, unspecified: Secondary | ICD-10-CM | POA: Diagnosis not present

## 2022-05-27 DIAGNOSIS — R739 Hyperglycemia, unspecified: Secondary | ICD-10-CM | POA: Diagnosis not present

## 2022-05-27 DIAGNOSIS — R5382 Chronic fatigue, unspecified: Secondary | ICD-10-CM | POA: Diagnosis not present

## 2022-05-27 DIAGNOSIS — L658 Other specified nonscarring hair loss: Secondary | ICD-10-CM | POA: Diagnosis not present

## 2022-05-27 DIAGNOSIS — R7989 Other specified abnormal findings of blood chemistry: Secondary | ICD-10-CM | POA: Diagnosis not present

## 2022-05-27 DIAGNOSIS — Z0000 Encounter for general adult medical examination without abnormal findings: Secondary | ICD-10-CM | POA: Diagnosis not present

## 2022-05-27 DIAGNOSIS — E041 Nontoxic single thyroid nodule: Secondary | ICD-10-CM | POA: Diagnosis not present

## 2022-05-27 DIAGNOSIS — L659 Nonscarring hair loss, unspecified: Secondary | ICD-10-CM | POA: Diagnosis not present

## 2022-05-27 MED ORDER — LISDEXAMFETAMINE DIMESYLATE 70 MG PO CAPS
ORAL_CAPSULE | ORAL | 0 refills | Status: DC
Start: 2022-05-22 — End: 2022-06-10
  Filled 2022-05-27: qty 90, 90d supply, fill #0

## 2022-05-27 MED ORDER — LISDEXAMFETAMINE DIMESYLATE 70 MG PO CAPS: ORAL_CAPSULE | ORAL | 0 refills | Status: DC

## 2022-05-28 ENCOUNTER — Other Ambulatory Visit (HOSPITAL_COMMUNITY): Payer: Self-pay

## 2022-05-29 ENCOUNTER — Other Ambulatory Visit (HOSPITAL_COMMUNITY): Payer: Self-pay

## 2022-05-30 ENCOUNTER — Other Ambulatory Visit (HOSPITAL_COMMUNITY): Payer: Self-pay

## 2022-06-04 ENCOUNTER — Other Ambulatory Visit (HOSPITAL_COMMUNITY): Payer: Self-pay

## 2022-06-04 ENCOUNTER — Ambulatory Visit: Payer: 59 | Attending: Internal Medicine | Admitting: Pharmacist

## 2022-06-04 ENCOUNTER — Telehealth: Payer: Self-pay | Admitting: Pharmacist

## 2022-06-04 DIAGNOSIS — Z79899 Other long term (current) drug therapy: Secondary | ICD-10-CM

## 2022-06-04 MED ORDER — RINVOQ 15 MG PO TB24
1.0000 | ORAL_TABLET | Freq: Every day | ORAL | 3 refills | Status: DC
  Filled 2022-06-04: qty 30, 30d supply, fill #0
  Filled 2022-06-26: qty 30, 30d supply, fill #1
  Filled 2022-08-09 – 2022-08-14 (×2): qty 30, 30d supply, fill #2
  Filled 2022-09-08: qty 30, 30d supply, fill #3

## 2022-06-04 NOTE — Progress Notes (Signed)
  S: Patient presents today for review of their specialty medication.   Patient is currently taking Rinvoq for rheumatoid arthritis. Patient is managed by Dr. Kathlene November for this.   Dosing: Adult  Note: May be used as monotherapy or in combination with methotrexate or other nonbiologic disease-modifying antirheumatic drugs (DMARDs); use in combination with biologic DMARDS or potent immunosuppressants (eg, azathioprine, cyclosporine) is not recommended. Do not initiate therapy in patients with an absolute lymphocyte count <500/mm3, ANC <1,000/mm3, or hemoglobin <8 g/dL. Rheumatoid arthritis: Oral: 15 mg once daily.  Adherence: confirms. Has been taking samples provided by her specialist.  Efficacy: pt feels that it might be working. However, level of control varies.   Monitoring: S/sx thromboembolism: none S/sx malignancy: none  S/sx of infection: none  Current adverse effects: none   O:     Lab Results  Component Value Date   WBC 11.5 (H) 10/11/2021   HGB 14.2 10/11/2021   HCT 40.8 10/11/2021   MCV 96.5 10/11/2021   PLT 289 10/11/2021      Chemistry      Component Value Date/Time   NA 134 (L) 10/11/2021 1227   K 4.0 10/11/2021 1227   CL 101 10/11/2021 1227   CO2 24 10/11/2021 1227   BUN 19 10/11/2021 1227   CREATININE 1.03 (H) 10/11/2021 1227   CREATININE 0.92 05/30/2021 1414   CREATININE 0.87 11/15/2019 1546      Component Value Date/Time   CALCIUM 9.0 10/11/2021 1227   ALKPHOS 39 10/11/2021 1227   AST 17 10/11/2021 1227   AST 16 05/30/2021 1414   ALT 18 10/11/2021 1227   ALT 18 05/30/2021 1414   BILITOT 0.6 10/11/2021 1227   BILITOT 0.4 05/30/2021 1414       Lab Results  Component Value Date   CHOL 207 (H) 08/06/2020   HDL 73 08/06/2020   LDLCALC 117 (H) 08/06/2020   LDLDIRECT 123.2 07/13/2007   TRIG 85 08/06/2020   CHOLHDL 2.8 08/06/2020     A/P: 1. Medication review: patient currently prescribed Rinvoq for rheumatoid arthritis. Reviewed the  medication with the patient, including the following: Rinvoq is a medication used to treat rheumatoid arthritis. Administer with or without food. Swallow tablet whole; do not crush, split, or chew. Possible adverse effects include increased risk of infection, GI upset, hematologic toxicity, hepatic effects, lipid abnormalities, increased risk of malignancy, thromboembolism. Avoid live vaccinations. No recommendations for any changes.  Benard Halsted, PharmD, [facility] [place], [place] [phone]

## 2022-06-04 NOTE — Telephone Encounter (Signed)
Called patient to schedule an appointment for the [place] [employer] [facility]. I was unable to reach the patient so I left a HIPAA-compliant message requesting that the patient return my call.   Luke Van Ausdall, PharmD, BCACP, CPP Clinical Pharmacist [facility] [phone]  

## 2022-06-10 ENCOUNTER — Encounter: Payer: Self-pay | Admitting: Physical Medicine and Rehabilitation

## 2022-06-10 ENCOUNTER — Encounter: Payer: 59 | Attending: Physical Medicine and Rehabilitation | Admitting: Physical Medicine and Rehabilitation

## 2022-06-10 VITALS — BP 114/79 | HR 80 | Ht 60.0 in | Wt 126.0 lb

## 2022-06-10 DIAGNOSIS — M7918 Myalgia, other site: Secondary | ICD-10-CM | POA: Diagnosis not present

## 2022-06-10 DIAGNOSIS — M069 Rheumatoid arthritis, unspecified: Secondary | ICD-10-CM | POA: Insufficient documentation

## 2022-06-10 MED ORDER — DICLOFENAC SODIUM 1 % EX GEL
2.0000 g | Freq: Four times a day (QID) | CUTANEOUS | 3 refills | Status: DC
Start: 2022-06-10 — End: 2023-03-17

## 2022-06-10 MED ORDER — VITAMIN D (ERGOCALCIFEROL) 1.25 MG (50000 UNIT) PO CAPS: 50000.0000 [IU] | ORAL_CAPSULE | ORAL | 0 refills | Status: DC

## 2022-06-10 MED ORDER — LIDOCAINE 5 % EX PTCH: 1.0000 | MEDICATED_PATCH | CUTANEOUS | 0 refills | Status: DC

## 2022-06-10 NOTE — Patient Instructions (Addendum)
Joove infrared lamp  Amla [place] gooseberry   Foods that may reduce pain: 1) Ginger (especially studied for arthritis)- reduce leukotriene production to decrease inflammation 2) Blueberries- high in phytonutrients that decrease inflammation 3) Salmon- marine omega-3s reduce joint swelling and pain 4) Pumpkin seeds- reduce inflammation 5) dark chocolate- reduces inflammation 6) turmeric- reduces inflammation 7) tart cherries - reduce pain and stiffness 8) extra virgin olive oil - its compound olecanthal helps to block prostaglandins  9) chili peppers- can be eaten or applied topically via capsaicin 10) mint- helpful for headache, muscle aches, joint pain, and itching 11) garlic- reduces inflammation  Link to further information on diet for chronic pain: [email or web address]

## 2022-06-10 NOTE — Progress Notes (Signed)
Subjective:    Patient ID: Natalie Ford, female    DOB: December 16, 1970, 51 y.o.   MRN: [id number]  HPI Mrs. Pappalardo is a 51 year old woman who presents for follow-up of LONG COVID and RA.   1) LONG COVID She has had COVD three times Proir to COVID she had no medical issues Since COVID she has been diagnosed with asthma, RA, mini-stroke, hair loss, back pain, wrist pain, left knee and elbow pain, migraines -She tries to deal with the pain, she feels that she has gotten used to it -she used to run before she got all this -she tries to work out -when she does cardio, her heart rate goes up and she starts sweating -she can walk -COVID has changed her life and she takes a million medications -she feels tired all the time despite taking stimulant -she could fall asleep just sitting here.  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  -she has been on prednisone for 2 years and one of her main goals is to get off this- she plans to discuss with her rheumatologist.  -she has tried to wean off the prednisone herself and she felt very tired.   2) Migraines -she is on propanolol -fioricet knocked her out.  -Nurtec helped.   3) RA -she started Rinvoq- she is on her second sample bottle- so far so good as far as side effects, but she is not sure if it is working -her right first digit MCP is killing her today -her back always hurts -sometimes if feels like an elephant is sitting on  -she is still on prednisone -pain is worse in the morning.  -she tries to start stretching as soon as possible  4) Dehydration -she had to stop taking ibuprofen.  -her spironolactone dose was decreased from 100mg  to 50mg   5) Vitamin D deficiency -she felt she was off, sad, and thought this may be related to.   6) POTS -well controlled with propanolol  7) Thinning hair: -added on Biotin -she does not wash her hair as often  8) Low back pain  Pain Inventory Average Pain 7 Pain Right Now 5 My pain  is intermittent, constant, burning, dull, tingling, and aching  In the last 24 hours, has pain interfered with the following? General activity 3 Relation with others 2 Enjoyment of life 2 What TIME of day is your pain at its worst? morning  and varies Sleep (in general) Fair  Pain is worse with: sitting and inactivity Pain improves with: heat/ice, therapy/exercise, and pacing activities Relief from Meds: 6      Family History  Problem Relation Age of Onset   Diabetes Mother    Heart disease Mother    Stroke Mother    Diabetes Sister    Leukemia Father    Stomach cancer Paternal Grandfather    Kidney disease Paternal Aunt    Social History   Socioeconomic History   Marital status: Married    Spouse name: Not on file   Number of children: 2   Years of education: Not on file   Highest education level: Not on file  Occupational History   Occupation: , : [employer]   Occupation: Charity fundraiser, BSN    Comment: [employer]  Tobacco Use   Smoking status: Never   Smokeless tobacco: Never   Tobacco comments:    exposed to second hand smoke  Vaping Use   Vaping Use: Never  used  Substance and Sexual Activity   Alcohol use: Yes    Comment: social use   Drug use: No   Sexual activity: Not Currently  Other Topics Concern   Not on file  Social History Narrative   Lives with 2 children and husband   Right Handed   Drinks 1-2 cups caffeine daily   Social Determinants of Health   Financial Resource Strain: Not on file  Food Insecurity: Not on file  Transportation Needs: Not on file  Physical Activity: Not on file  Stress: Not on file  Social Connections: Not on file   Past Surgical History:  Procedure Laterality Date   CARPAL TUNNEL RELEASE Right    CESAREAN SECTION     x 2   Past Medical History:  Diagnosis Date   Allergic rhinitis    COVID-19    x 3   Dyspepsia    Hypercholesteremia    IBS (irritable bowel syndrome)     Lumbosacral strain    RA (rheumatoid arthritis) (HCC)    Stroke (HCC) 08/05/2020   tia   BP 114/79   Pulse 80   Ht 5' (1.524 m)   Wt 126 lb (57.2 kg)   LMP 08/08/2021 (Approximate)   SpO2 97%   BMI 24.61 kg/m   Opioid Risk Score:   Fall Risk Score:  `1  Depression screen [facility] 2/9     08/29/2021   10:40 AM 01/05/2020    2:16 PM 11/15/2019    2:54 PM 06/29/2013   10:00 AM  Depression screen PHQ 2/9  Decreased Interest 0 0 0 0  Down, Depressed, Hopeless 1 0 0 0  PHQ - 2 Score 1 0 0 0  Altered sleeping 1     Tired, decreased energy 3     Change in appetite 1     Feeling bad or failure about yourself  0     Trouble concentrating 3     Moving slowly or fidgety/restless 0     Suicidal thoughts 0     PHQ-9 Score 9     Difficult doing work/chores Somewhat difficult        Review of Systems  Musculoskeletal:  Positive for back pain.       Bilateral elbow pain Left knee pain  All other systems reviewed and are negative.      Objective:   Physical Exam Gen: no distress, normal appearing HEENT: oral mucosa pink and moist, NCAT Cardio: Reg rate Chest: normal effort, normal rate of breathing Abd: soft, non-distended Ext: no edema Psych: pleasant, normal affect Skin: intact Neuro: Alert and oriented x3     Assessment & Plan:   1) LONG COVID -Recommended starting NAC (N-Acetyl cysteine) 600mg  BID for her fatigue. Discussed its benefits in boosting glutathione and mitochondrial function. -dicussed cardiopulmonary rehab -discussed low dose naltrexone.  -discussed trial of intermittent fasting and its benefits for autophagy and stem cell production for longer fasts.  -discussed low vitamin D and starting ergocalciferol (see below)  2) Blurry vision: -encouraged getting new glasses.   3) Migraines -continue propanolol and Nurtec.  -discussed improvement  4) POTS: -well controlled currently -continue propanolol  5) Diffuse joint pains 2/2 RA -continue pressure  band -discussed that she continues to take prednisone -prescribed voltaren gel.  -commended her for continuing to work -discussed that inactivity worsens her pain -commended on her morning stretching regimen -discussed risks and benefits of immunosuppressive medications.  -Discussed following foods that may reduce pain: 1) Ginger (  especially studied for arthritis)- reduce leukotriene production to decrease inflammation 2) Blueberries- high in phytonutrients that decrease inflammation 3) Salmon- marine omega-3s reduce joint swelling and pain 4) Pumpkin seeds- reduce inflammation 5) dark chocolate- reduces inflammation 6) turmeric- reduces inflammation 7) tart cherries - reduce pain and stiffness 8) extra virgin olive oil - its compound olecanthal helps to block prostaglandins  9) chili peppers- can be eaten or applied topically via capsaicin 10) mint- helpful for headache, muscle aches, joint pain, and itching 11) garlic- reduces inflammation  Link to further information on diet for chronic pain: [email or web address]   6) Rheumatoid arthritis: -decrease to 14mg  daily for 1 month.  -discussed slow wean over the course of a year.  -discussed that this could be contributing to her elevated blood sugars and elevated WBC.   7) Provided with list of supplements that can help with dyslipidemia: Reviewed LDL 117.  -discussed that statin lowers both good and bad cholesterol.  1) Vitamin B3 500-4,000mg  in divided doses daily (would recommend starting low as can cause uncomfortable facial flushing if started at too high a dose) 2) Phytosterols 2.15 grams daily 3) Fermented soy 30-50 grams daily 4) EGCG (found in green tea): 500-1000mg  daily 5) Omega-3 fatty acids 3000-5,000mg  daily 6) Flax seed 40 grams daily 7) Monounsaturated fats 20-40 grams daily (olives, olive oil, nuts), also reduces cardiovascular disease 8)  Sesame: 40 grams daily 9) Gamma/delta tocotrienols- a family of unsaturated forms of Vitamin E- 200mg  with dinner 10) Pantethine 900mg  daily in divided doses 11) Resveratrol 250mg  daily 12) N Acetyl Cysteine 2000mg  daily in divided doses 13) Curcumin 2000-5000mg  in divided doses daily 14) Pomegranate juice: 8 ounces daily, also helps to lower blood pressure 15) Pomegranate seeds one cup daily, also helps to lower blood pressure 16) Citrus Bergamot 1000mg  daily, also helps with glucose control and weight loss 17) Vitamin C 500mg  daily 18) Quercetin 500-1000mg  daily 19) Glutathione 20) Probiotics 60-100 billion organisms per day 21) Fiber 22) Oats 23) Aged garlic (can eat as food or supplement of 600-900mg  per day) 24) Chia seeds 25 grams per day 25) Lycopene- carotenoid found in high concentrations in tomatoes. 26) Alpha linolenic acid 27) Flavonoids and anthocyanins 28) Wogonin- flavanoid that enhances reverse cholesterol transport 29) Coenzyme Q10 30) Pantethine- derivative of Vitamin B5: 300mg  three times per day or 450mg  twice per day with or without food 31) Barley and other whole grains 32) Orange juice 33) L- carnitine 34) L- Lysine 35) L- Arginine 36) Almonds 37) Morin 38) Rutin 39) Carnosine 40) Histidine  41) Kaempferol  42) Organosulfur compounds 43) Vitamin E 44) Oleic acid 45) RBO (ferulic acid gammaoryzanol) 46) grape seed extract 47) Red wine 48) Berberine HCL 500mg  daily or twice per day- more effective and with fewer adverse effects that ezetimibe monotherapy 49) red yeast rice 2400- 4800 mg/day 50) chlorella 51) Licorice   8) Cervical myofascial syndrome: -trigger point injections.  -continue biofreeze -continue stretching daily.  Discussed extracorporeal shockwave therapy as a modality for treatment. Discussed that the device looks and feels like a massage gun and I would move it over the area of pain for about 10 minutes. The device releases sound  waves to the area of pain and helps to improve blood flow and circulation to improve the healing process. Discuss that this initially induces inflammation and can sometimes cause short-term increase in pain. Discussed that we typically do three weekly treatments, but sometimes up to 6 if needed, and after 6 weeks long term  benefits can sometimes be achieved. Discussed that this is an FDA approved device, but not covered by insurance and would cost $60 per session. Will scheduled patient for 6 consecutive appointments and can cancel latter three if benefits are achieved after first three sessions.    -Provided with a pain relief journal and discussed that it contains foods and lifestyle tips to naturally help to improve pain. Discussed that these lifestyle strategies are also very good for health unlike some medications which can have negative side effects. Discussed that the act of keeping a journal can be therapeutic and helpful to realize patterns what helps to trigger and alleviate pain.      9) Vitamin D deficiency: -ergocalciferol 50,000U once per week for 20 weeks.  Discussed that deficiency has been linked to autoimmune disease.   2:08

## 2022-06-17 DIAGNOSIS — E7800 Pure hypercholesterolemia, unspecified: Secondary | ICD-10-CM | POA: Diagnosis not present

## 2022-06-17 DIAGNOSIS — R739 Hyperglycemia, unspecified: Secondary | ICD-10-CM | POA: Diagnosis not present

## 2022-06-17 DIAGNOSIS — R7989 Other specified abnormal findings of blood chemistry: Secondary | ICD-10-CM | POA: Diagnosis not present

## 2022-06-17 DIAGNOSIS — Z0000 Encounter for general adult medical examination without abnormal findings: Secondary | ICD-10-CM | POA: Diagnosis not present

## 2022-06-24 ENCOUNTER — Other Ambulatory Visit (HOSPITAL_COMMUNITY): Payer: Self-pay

## 2022-06-26 ENCOUNTER — Other Ambulatory Visit (HOSPITAL_COMMUNITY): Payer: Self-pay

## 2022-07-01 ENCOUNTER — Other Ambulatory Visit (HOSPITAL_COMMUNITY): Payer: Self-pay

## 2022-07-26 ENCOUNTER — Other Ambulatory Visit: Payer: Self-pay | Admitting: Psychiatry

## 2022-07-27 NOTE — Telephone Encounter (Signed)
Can you send

## 2022-07-28 ENCOUNTER — Other Ambulatory Visit (HOSPITAL_COMMUNITY): Payer: Self-pay

## 2022-08-04 DIAGNOSIS — M0609 Rheumatoid arthritis without rheumatoid factor, multiple sites: Secondary | ICD-10-CM | POA: Diagnosis not present

## 2022-08-04 DIAGNOSIS — H04123 Dry eye syndrome of bilateral lacrimal glands: Secondary | ICD-10-CM | POA: Diagnosis not present

## 2022-08-04 DIAGNOSIS — R5383 Other fatigue: Secondary | ICD-10-CM | POA: Diagnosis not present

## 2022-08-04 DIAGNOSIS — M8580 Other specified disorders of bone density and structure, unspecified site: Secondary | ICD-10-CM | POA: Diagnosis not present

## 2022-08-04 DIAGNOSIS — Z79899 Other long term (current) drug therapy: Secondary | ICD-10-CM | POA: Diagnosis not present

## 2022-08-04 DIAGNOSIS — M791 Myalgia, unspecified site: Secondary | ICD-10-CM | POA: Diagnosis not present

## 2022-08-04 DIAGNOSIS — M2550 Pain in unspecified joint: Secondary | ICD-10-CM | POA: Diagnosis not present

## 2022-08-04 DIAGNOSIS — K589 Irritable bowel syndrome without diarrhea: Secondary | ICD-10-CM | POA: Diagnosis not present

## 2022-08-04 DIAGNOSIS — D72829 Elevated white blood cell count, unspecified: Secondary | ICD-10-CM | POA: Diagnosis not present

## 2022-08-08 ENCOUNTER — Encounter: Payer: 59 | Admitting: Physical Medicine and Rehabilitation

## 2022-08-11 ENCOUNTER — Other Ambulatory Visit: Payer: Self-pay

## 2022-08-13 ENCOUNTER — Ambulatory Visit (INDEPENDENT_AMBULATORY_CARE_PROVIDER_SITE_OTHER): Payer: 59

## 2022-08-13 ENCOUNTER — Ambulatory Visit: Payer: 59 | Attending: Cardiology | Admitting: Cardiology

## 2022-08-13 ENCOUNTER — Encounter: Payer: Self-pay | Admitting: Cardiology

## 2022-08-13 VITALS — BP 136/72 | HR 86 | Ht 60.0 in | Wt 127.2 lb

## 2022-08-13 DIAGNOSIS — E785 Hyperlipidemia, unspecified: Secondary | ICD-10-CM | POA: Diagnosis not present

## 2022-08-13 DIAGNOSIS — R002 Palpitations: Secondary | ICD-10-CM

## 2022-08-13 DIAGNOSIS — R0600 Dyspnea, unspecified: Secondary | ICD-10-CM

## 2022-08-13 DIAGNOSIS — I493 Ventricular premature depolarization: Secondary | ICD-10-CM

## 2022-08-13 LAB — TSH: TSH: 1.36 u[IU]/mL (ref 0.450–4.500)

## 2022-08-13 NOTE — Progress Notes (Signed)
Cardiology Office Note:    Date:  08/13/2022   ID:  Natalie Ford, DOB 1971/01/14, MRN [id number]  PCP:  Deland Pretty, MD  Cardiologist:  None  Electrophysiologist:  None   Referring MD: Deland Pretty, MD   Chief complaint: dyspnea/palpitations  History of Present Illness:    Natalie Ford is a 51 y.o. female with a hx of COVID-19 infection who presents for follow-up.  She was referred by Dr. Kathlene November for evaluation of dyspnea/palpitations, initially seen on 12/19/2019.  She was diagnosed with Covid in March 2020.  Following this had myalgias, shortness of breath, and palpitations.  She saw rheumatology and was diagnosed with seronegative rheumatoid arthritis.  Started on prednisone in September 2020.  She also did a trial of methotrexate but did not tolerate.  Also has been on leflunomide.  She was suspected of having a reinfection of COVID-19 in January 2021.  She developed pneumonia in February.  She states that since that time she has been having fevers daily.  She has also been short of breath.  Reports that she walks 20 minutes for 5 days/week, but is limited by dyspnea.  Denies any chest pain currently.  Also has been having palpitations.  Described as feeling like heart is racing.  Will happen multiple times per day, can last for hours.  Has checked her heart rate and will will be in 120s.  She was seen by ID for fevers of unknown origin, underwent CT abdomen/pelvis which was unremarkable.  Never smoked.  Mother died of MI in 25s, first had PCI in 68s.   Echocardiogram on 01/03/2020 showed EF 65 to 70%, normal RV function, no significant valvular disease.  Zio patch x3 days on 01/11/2020 showed occasional PVCs (1.9% of beats).  Since last clinic visit, she reports she was doing okay until last week began having palpitations.  States that she feels short of breath since that time.  Has felt palpitations all day today.  Feels like heart is pounding, does not feel like heart  rate is going fast.  She denies any chest pain, lightheadedness, or syncope.  No recent lower extremity edema.  She has been taking Aldactone for hair loss since COVID.  She stopped drinking coffee.  She has been doing yoga and weightlifting for exercise.   Past Medical History:  Diagnosis Date   Allergic rhinitis    COVID-19    x 3   Dyspepsia    Hypercholesteremia    IBS (irritable bowel syndrome)    Lumbosacral strain    RA (rheumatoid arthritis) ([place])    Stroke ([place]) 08/05/2020   tia    Past Surgical History:  Procedure Laterality Date   CARPAL TUNNEL RELEASE Right    CESAREAN SECTION     x 2    Current Medications: Current Meds  Medication Sig   albuterol (VENTOLIN HFA) 108 (90 Base) MCG/ACT inhaler Inhale 1-2 puffs into the lungs every 6 (six) hours as needed for wheezing or shortness of breath.   aspirin EC 81 MG EC tablet Take 1 tablet (81 mg total) by mouth daily. Swallow whole.   buPROPion (WELLBUTRIN XL) 150 MG 24 hr tablet TAKE THREE TABLETS BY MOUTH EVERY MORNING   cycloSPORINE, PF, (CEQUA) 0.09 % SOLN Place 1 drop into both eyes 2 times a day   diclofenac Sodium (VOLTAREN ARTHRITIS PAIN) 1 % GEL Apply 2 g topically 4 (four) times daily.   fluticasone (FLONASE) 50 MCG/ACT nasal spray Place 2 sprays into both  nostrils daily.   fluticasone-salmeterol (ADVAIR HFA) 230-21 MCG/ACT inhaler Inhale 2 puffs into the lungs 2 (two) times daily.   gabapentin (NEURONTIN) 300 MG capsule Take 400 mg by mouth 3 (three) times daily as needed.   gabapentin (NEURONTIN) 400 MG capsule    lidocaine (LIDODERM) 5 % Place 1 patch onto the skin daily. Remove & Discard patch within 12 hours or as directed by MD   lisdexamfetamine (VYVANSE) 70 MG capsule Take 1 capsule (70 mg total) by mouth in the morning.   Multiple Vitamin (MULTIVITAMIN) capsule Take 1 capsule by mouth at bedtime.   NURTEC 75 MG TBDP PLACE ONE TABLET BY MOUTH DAILY AS NEEDED   Omega-3 Fatty Acids (FISH OIL) 1000 MG CAPS  Take 2 capsules by mouth daily.   predniSONE (DELTASONE) 1 MG tablet TAKE 4 TABLETS BY MOUTH EVERY MORNING WITH BREAKFAST   predniSONE (DELTASONE) 10 MG tablet Take 8 mg by mouth.   propranolol (INDERAL) 40 MG tablet Take 1 tablet (40 mg total) by mouth 3 (three) times daily.   rosuvastatin (CRESTOR) 10 MG tablet Take 10 mg by mouth daily.   spironolactone (ALDACTONE) 50 MG tablet Take 75 mg by mouth daily.   Upadacitinib ER (RINVOQ) 15 MG TB24 Take 1 tablet by mouth daily.     Allergies:   Penicillins   Social History   Socioeconomic History   Marital status: Married    Spouse name: Not on file   Number of children: 2   Years of education: Not on file   Highest education level: Not on file  Occupational History   Occupation: Therapist, sports, Multimedia programmer: [employer]   Occupation: Therapist, sports, Copywriter, advertising    Comment: [employer]  Tobacco Use   Smoking status: Never   Smokeless tobacco: Never   Tobacco comments:    exposed to second hand smoke  Vaping Use   Vaping Use: Never used  Substance and Sexual Activity   Alcohol use: Yes    Comment: social use   Drug use: No   Sexual activity: Not Currently  Other Topics Concern   Not on file  Social History Narrative   Lives with 2 children and husband   Right Handed   Drinks 1-2 cups caffeine daily   Social Determinants of Health   Financial Resource Strain: Not on file  Food Insecurity: Not on file  Transportation Needs: Not on file  Physical Activity: Not on file  Stress: Not on file  Social Connections: Not on file     Family History: The patient's family history includes Diabetes in her mother and sister; Heart disease in her mother; Kidney disease in her paternal aunt; Leukemia in her father; Stomach cancer in her paternal grandfather; Stroke in her mother.  ROS:   Please see the history of present illness.     All other systems reviewed and are negative.  EKGs/Labs/Other Studies Reviewed:    The following  studies were reviewed today:   EKG:   08/13/2022: Sinus rhythm with frequent PVCs, right axis deviation, nonspecific T wave flattening, rate 86  Recent Labs: 10/11/2021: ALT 18; BUN 19; Creatinine, Ser 1.03; Hemoglobin 14.2; Platelets 289; Potassium 4.0; Sodium 134  Recent Lipid Panel    Component Value Date/Time   CHOL 207 (H) 08/06/2020 0336   TRIG 85 08/06/2020 0336   TRIG 65 08/21/2006 0923   HDL 73 08/06/2020 0336   CHOLHDL 2.8 08/06/2020 0336   VLDL 17 08/06/2020 0336  LDLCALC 117 (H) 08/06/2020 0336   LDLDIRECT 123.2 07/13/2007 4098    Physical Exam:    VS:  BP 136/72   Pulse 86   Ht 5' (1.524 m)   Wt 127 lb 3.2 oz (57.7 kg)   LMP 08/08/2021 (Approximate)   SpO2 99%   BMI 24.84 kg/m     Wt Readings from Last 3 Encounters:  08/13/22 127 lb 3.2 oz (57.7 kg)  06/10/22 126 lb (57.2 kg)  12/19/21 120 lb (54.4 kg)     GEN:  Well nourished, well developed in no acute distress HEENT: Normal NECK: No JVD CARDIAC: irregular, normal rate, no murmurs, rubs, gallops RESPIRATORY:  Clear to auscultation without rales, wheezing or rhonchi  ABDOMEN: Soft, non-tender, non-distended MUSCULOSKELETAL:  No edema SKIN: Warm and dry NEUROLOGIC:  Alert and oriented x 3 PSYCHIATRIC:  Normal affect   ASSESSMENT:    1. Palpitations   2. PVC's (premature ventricular contractions)   3. Hyperlipidemia, unspecified hyperlipidemia type   4. Dyspnea, unspecified type     PLAN:    Palpitations/PVCs: Reported palpitations, Zio patch x3 days on 01/11/2020 showed occasional PVCs (1.9% of beats).  Echo unremarkable 01/03/2020.  Reported worsening palpitations over the last week and having frequent PVCs on EKG in clinic today -She is prescribed propranolol 40 mg 3 times daily for her migraine/palpitations, but has only been taking twice daily.  Recommend increasing back to 3 times daily -Taking prednisone for RA which maybe contributing, is tapering off -Taking Vyvanse for ADHD which also  may be contributing but no recent dose adjustment -Check CMET, CBC, TSH -ZIO patch x 7 days to quantify PVC burden -Update echocardiogram given worsening symptoms  Dyspnea: Given persistent symptoms since COVID-19 infection, echocardiogram ordered to evaluate for myocardial involvement.  Echocardiogram on 01/03/2020 showed EF 65 to 70%, normal RV function, no significant valvular disease.  Hyperlipidemia: On rosuvastatin 10 mg daily.  Check lipid panel  RTC in 3 months   Medication Adjustments/Labs and Tests Ordered: Current medicines are reviewed at length with the patient today.  Concerns regarding medicines are outlined above.  Orders Placed This Encounter  Procedures   Comprehensive metabolic panel   Magnesium   CBC   Lipid panel   TSH   LONG TERM MONITOR (3-14 DAYS)   EKG 12-Lead   ECHOCARDIOGRAM COMPLETE   No orders of the defined types were placed in this encounter.   Patient Instructions  Medication Instructions:  Take propranolol 40 mg three times daily (every 8 hours)  *If you need a refill on your cardiac medications before your next appointment, please call your pharmacy*   Lab Work: CMET, Mag, CBC, Lipid, TSH today  If you have labs (blood work) drawn today and your tests are completely normal, you will receive your results only by: MyChart Message (if you have MyChart) OR A paper copy in the mail If you have any lab test that is abnormal or we need to change your treatment, we will call you to review the results.   Testing/Procedures: Your physician has requested that you have an echocardiogram. Echocardiography is a painless test that uses sound waves to create images of your heart. It provides your doctor with information about the size and shape of your heart and how well your heart's chambers and valves are working. This procedure takes approximately one hour. There are no restrictions for this procedure. Please do NOT wear cologne, perfume, aftershave,  or lotions (deodorant is allowed). Please arrive 15 minutes prior  to your appointment time.  ZIO XT- Long Term Monitor Instructions   Your physician has requested you wear a ZIO patch monitor for _7_ days.  This is a single patch monitor.   [employer] supplies one patch monitor per enrollment. Additional stickers are not available. Please do not apply patch if you will be having a Nuclear Stress Test, Echocardiogram, Cardiac CT, MRI, or Chest Xray during the period you would be wearing the monitor. The patch cannot be worn during these tests. You cannot remove and re-apply the ZIO XT patch monitor.  Your ZIO patch monitor will be sent Fed Ex from [employer] directly to your home address. It may take 3-5 days to receive your monitor after you have been enrolled.  Once you have received your monitor, please review the enclosed instructions. Your monitor has already been registered assigning a specific monitor serial # to you.  Billing and Patient Assistance Program Information   We have supplied [employer] with any of your insurance information on file for billing purposes. [employer] offers a sliding scale Patient Assistance Program for patients that do not have insurance, or whose insurance does not completely cover the cost of the ZIO monitor.   You must apply for the Patient Assistance Program to qualify for this discounted rate.     To apply, please call [employer] at [phone], select option 4, then select option 2, and ask to apply for Patient Assistance Program.  Theodore Demark will ask your household income, and how many people are in your household.  They will quote your out-of-pocket cost based on that information.  [employer] will also be able to set up a 36-month, interest-free payment plan if needed.  Applying the monitor   Shave hair from upper left chest.  Hold abrader disc by orange tab. Rub abrader in 40 strokes over the upper left chest as indicated in your monitor instructions.  Clean  area with 4 enclosed alcohol pads. Let dry.  Apply patch as indicated in monitor instructions. Patch will be placed under collarbone on left side of chest with arrow pointing upward.  Rub patch adhesive wings for 2 minutes. Remove white label marked "1". Remove the white label marked "2". Rub patch adhesive wings for 2 additional minutes.  While looking in a mirror, press and release button in center of patch. A small green light will flash 3-4 times. This will be your only indicator that the monitor has been turned on. ?  Do not shower for the first 24 hours. You may shower after the first 24 hours.  Press the button if you feel a symptom. You will hear a small click. Record Date, Time and Symptom in the Patient Logbook.  When you are ready to remove the patch, follow instructions on the last 2 pages of the Patient Logbook. Stick patch monitor onto the last page of Patient Logbook.  Place Patient Logbook in the blue and white box.  Use locking tab on box and tape box closed securely.  The blue and white box has prepaid postage on it. Please place it in the mailbox as soon as possible. Your physician should have your test results approximately 7 days after the monitor has been mailed back to [facility].  Call [facility] at [phone] if you have questions regarding your ZIO XT patch monitor. Call them immediately if you see an orange light blinking on your monitor.  If your monitor falls off in less than 4 days, contact our Monitor department at  [phone]. ?If your monitor becomes loose or falls off after 4 days call [employer] at [phone] for suggestions on securing your monitor.?  Follow-Up: At [facility], you and your health needs are our priority.  As part of our continuing mission to provide you with exceptional heart care, we have created designated Provider Care Teams.  These Care Teams include your primary Cardiologist (physician) and Advanced Practice  Providers (APPs -  Physician Assistants and Nurse Practitioners) who all work together to provide you with the care you need, when you need it.  We recommend signing up for the patient portal called "MyChart".  Sign up information is provided on this After Visit Summary.  MyChart is used to connect with patients for Virtual Visits (Telemedicine).  Patients are able to view lab/test results, encounter notes, upcoming appointments, etc.  Non-urgent messages can be sent to your provider as well.   To learn more about what you can do with MyChart, go to [email or web address].    Your next appointment:   3 month(s)  The format for your next appointment:   In Person  Provider:   Dr. Gardiner Rhyme      Signed, Donato Heinz, MD  08/13/2022 11:31 AM    [place]

## 2022-08-13 NOTE — Patient Instructions (Addendum)
Medication Instructions:  Take propranolol 40 mg three times daily (every 8 hours)  *If you need a refill on your cardiac medications before your next appointment, please call your pharmacy*   Lab Work: CMET, Mag, CBC, Lipid, TSH today  If you have labs (blood work) drawn today and your tests are completely normal, you will receive your results only by: MyChart Message (if you have MyChart) OR A paper copy in the mail If you have any lab test that is abnormal or we need to change your treatment, we will call you to review the results.   Testing/Procedures: Your physician has requested that you have an echocardiogram. Echocardiography is a painless test that uses sound waves to create images of your heart. It provides your doctor with information about the size and shape of your heart and how well your heart's chambers and valves are working. This procedure takes approximately one hour. There are no restrictions for this procedure. Please do NOT wear cologne, perfume, aftershave, or lotions (deodorant is allowed). Please arrive 15 minutes prior to your appointment time.  ZIO XT- Long Term Monitor Instructions   Your physician has requested you wear a ZIO patch monitor for _7_ days.  This is a single patch monitor.   [employer] supplies one patch monitor per enrollment. Additional stickers are not available. Please do not apply patch if you will be having a Nuclear Stress Test, Echocardiogram, Cardiac CT, MRI, or Chest Xray during the period you would be wearing the monitor. The patch cannot be worn during these tests. You cannot remove and re-apply the ZIO XT patch monitor.  Your ZIO patch monitor will be sent Fed Ex from [employer] directly to your home address. It may take 3-5 days to receive your monitor after you have been enrolled.  Once you have received your monitor, please review the enclosed instructions. Your monitor has already been registered assigning a specific monitor  serial # to you.  Billing and Patient Assistance Program Information   We have supplied [employer] with any of your insurance information on file for billing purposes. [employer] offers a sliding scale Patient Assistance Program for patients that do not have insurance, or whose insurance does not completely cover the cost of the ZIO monitor.   You must apply for the Patient Assistance Program to qualify for this discounted rate.     To apply, please call [employer] at [phone], select option 4, then select option 2, and ask to apply for Patient Assistance Program.  Meredeth Ide will ask your household income, and how many people are in your household.  They will quote your out-of-pocket cost based on that information.  [employer] will also be able to set up a 73-month, interest-free payment plan if needed.  Applying the monitor   Shave hair from upper left chest.  Hold abrader disc by orange tab. Rub abrader in 40 strokes over the upper left chest as indicated in your monitor instructions.  Clean area with 4 enclosed alcohol pads. Let dry.  Apply patch as indicated in monitor instructions. Patch will be placed under collarbone on left side of chest with arrow pointing upward.  Rub patch adhesive wings for 2 minutes. Remove white label marked "1". Remove the white label marked "2". Rub patch adhesive wings for 2 additional minutes.  While looking in a mirror, press and release button in center of patch. A small green light will flash 3-4 times. This will be your only indicator that the monitor has been turned  on. ?  Do not shower for the first 24 hours. You may shower after the first 24 hours.  Press the button if you feel a symptom. You will hear a small click. Record Date, Time and Symptom in the Patient Logbook.  When you are ready to remove the patch, follow instructions on the last 2 pages of the Patient Logbook. Stick patch monitor onto the last page of Patient Logbook.  Place Patient Logbook in the blue  and white box.  Use locking tab on box and tape box closed securely.  The blue and white box has prepaid postage on it. Please place it in the mailbox as soon as possible. Your physician should have your test results approximately 7 days after the monitor has been mailed back to [facility].  Call [facility] Customer Care at [phone] if you have questions regarding your ZIO XT patch monitor. Call them immediately if you see an orange light blinking on your monitor.  If your monitor falls off in less than 4 days, contact our Monitor department at [phone]. ?If your monitor becomes loose or falls off after 4 days call [employer] at [phone] for suggestions on securing your monitor.?  Follow-Up: At [facility], you and your health needs are our priority.  As part of our continuing mission to provide you with exceptional heart care, we have created designated Provider Care Teams.  These Care Teams include your primary Cardiologist (physician) and Advanced Practice Providers (APPs -  Physician Assistants and Nurse Practitioners) who all work together to provide you with the care you need, when you need it.  We recommend signing up for the patient portal called "MyChart".  Sign up information is provided on this After Visit Summary.  MyChart is used to connect with patients for Virtual Visits (Telemedicine).  Patients are able to view lab/test results, encounter notes, upcoming appointments, etc.  Non-urgent messages can be sent to your provider as well.   To learn more about what you can do with MyChart, go to [email or web address].    Your next appointment:   3 month(s)  The format for your next appointment:   In Person  Provider:   Dr. Bjorn Pippin

## 2022-08-13 NOTE — Progress Notes (Unsigned)
Enrolled patient for a 7 day Zio XT monitor to be mailed to patients home.  

## 2022-08-14 ENCOUNTER — Other Ambulatory Visit: Payer: Self-pay

## 2022-08-14 LAB — COMPREHENSIVE METABOLIC PANEL
ALT: 27 IU/L (ref 0–32)
AST: 24 IU/L (ref 0–40)
Albumin/Globulin Ratio: 2.9 — ABNORMAL HIGH (ref 1.2–2.2)
Albumin: 4.9 g/dL (ref 3.8–4.9)
Alkaline Phosphatase: 59 IU/L (ref 44–121)
BUN/Creatinine Ratio: 27 — ABNORMAL HIGH (ref 9–23)
BUN: 23 mg/dL (ref 6–24)
Bilirubin Total: 0.3 mg/dL (ref 0.0–1.2)
CO2: 22 mmol/L (ref 20–29)
Calcium: 9.7 mg/dL (ref 8.7–10.2)
Chloride: 103 mmol/L (ref 96–106)
Creatinine, Ser: 0.84 mg/dL (ref 0.57–1.00)
Globulin, Total: 1.7 g/dL (ref 1.5–4.5)
Glucose: 90 mg/dL (ref 70–99)
Potassium: 4.5 mmol/L (ref 3.5–5.2)
Sodium: 139 mmol/L (ref 134–144)
Total Protein: 6.6 g/dL (ref 6.0–8.5)
eGFR: 84 mL/min/{1.73_m2} (ref >59)

## 2022-08-14 LAB — CBC
Hematocrit: 44.1 % (ref 34.0–46.6)
Hemoglobin: 14.9 g/dL (ref 11.1–15.9)
MCH: 33.6 pg — ABNORMAL HIGH (ref 26.6–33.0)
MCHC: 33.8 g/dL (ref 31.5–35.7)
MCV: 100 fL — ABNORMAL HIGH (ref 79–97)
Platelets: 317 10*3/uL (ref 150–450)
RBC: 4.43 x10E6/uL (ref 3.77–5.28)
RDW: 11.5 % — ABNORMAL LOW (ref 11.7–15.4)
WBC: 8.1 10*3/uL (ref 3.4–10.8)

## 2022-08-14 LAB — LIPID PANEL
Chol/HDL Ratio: 1.9 ratio (ref 0.0–4.4)
Cholesterol, Total: 184 mg/dL (ref 100–199)
HDL: 97 mg/dL (ref >39)
LDL Chol Calc (NIH): 64 mg/dL (ref 0–99)
Triglycerides: 142 mg/dL (ref 0–149)
VLDL Cholesterol Cal: 23 mg/dL (ref 5–40)

## 2022-08-14 LAB — MAGNESIUM: Magnesium: 2.3 mg/dL (ref 1.6–2.3)

## 2022-08-15 ENCOUNTER — Other Ambulatory Visit (HOSPITAL_COMMUNITY): Payer: Self-pay

## 2022-08-17 DIAGNOSIS — I493 Ventricular premature depolarization: Secondary | ICD-10-CM | POA: Diagnosis not present

## 2022-08-17 DIAGNOSIS — R002 Palpitations: Secondary | ICD-10-CM

## 2022-08-26 ENCOUNTER — Telehealth: Payer: 59 | Admitting: Physician Assistant

## 2022-08-26 ENCOUNTER — Other Ambulatory Visit (HOSPITAL_COMMUNITY): Payer: Self-pay

## 2022-08-26 DIAGNOSIS — J019 Acute sinusitis, unspecified: Secondary | ICD-10-CM | POA: Diagnosis not present

## 2022-08-26 DIAGNOSIS — B9789 Other viral agents as the cause of diseases classified elsewhere: Secondary | ICD-10-CM

## 2022-08-26 MED ORDER — IPRATROPIUM BROMIDE 0.03 % NA SOLN: 2.0000 | Freq: Two times a day (BID) | NASAL | 0 refills | Status: DC

## 2022-08-26 MED ORDER — LISDEXAMFETAMINE DIMESYLATE 70 MG PO CAPS
ORAL_CAPSULE | ORAL | 0 refills | Status: DC
  Filled 2022-08-26: qty 30, 30d supply, fill #0

## 2022-08-26 NOTE — Progress Notes (Signed)

## 2022-08-26 NOTE — Progress Notes (Signed)
I have spent 5 minutes in review of e-visit questionnaire, review and updating patient chart, medical decision making and response to patient.   Jerod Mcquain Cody Carri Spillers, [name]    

## 2022-08-29 ENCOUNTER — Encounter: Payer: 59 | Attending: Physical Medicine and Rehabilitation | Admitting: Physical Medicine and Rehabilitation

## 2022-08-29 VITALS — BP 121/80 | HR 75 | Ht 60.0 in | Wt 128.0 lb

## 2022-08-29 DIAGNOSIS — M7918 Myalgia, other site: Secondary | ICD-10-CM | POA: Diagnosis not present

## 2022-08-29 MED ORDER — SODIUM CHLORIDE (PF) 0.9 % IJ SOLN: 2.0000 mL | INTRAMUSCULAR | Status: AC | PRN

## 2022-08-29 MED ORDER — LIDOCAINE HCL 1 % IJ SOLN
3.0000 mL | Freq: Once | INTRAMUSCULAR | Status: AC
  Administered 2022-08-29: 3 mL

## 2022-08-29 NOTE — Progress Notes (Signed)
Trigger Point Injection  Indication: Cervical myofascial pain not relieved by medication management and other conservative care.  Informed consent was obtained after describing risk and benefits of the procedure with the patient, this includes bleeding, bruising, infection and medication side effects.  The patient wishes to proceed and has given written consent.  The patient was placed in a seated position.  The area of pain was marked and prepped with Betadine.  It was entered with a 25-gauge 1/2 inch needle and a total of 5 mL of 1% lidocaine and normal saline was injected into a total of 5 trigger points, after negative draw back for blood.  The patient tolerated the procedure well.  Post procedure instructions were given.  

## 2022-09-08 ENCOUNTER — Other Ambulatory Visit (HOSPITAL_COMMUNITY): Payer: Self-pay

## 2022-09-10 ENCOUNTER — Ambulatory Visit (HOSPITAL_COMMUNITY): Payer: 59 | Attending: Cardiology

## 2022-09-10 DIAGNOSIS — I361 Nonrheumatic tricuspid (valve) insufficiency: Secondary | ICD-10-CM

## 2022-09-10 DIAGNOSIS — I081 Rheumatic disorders of both mitral and tricuspid valves: Secondary | ICD-10-CM

## 2022-09-10 DIAGNOSIS — I493 Ventricular premature depolarization: Secondary | ICD-10-CM | POA: Diagnosis not present

## 2022-09-10 LAB — ECHOCARDIOGRAM COMPLETE
Area-P 1/2: 3.72 cm2
MV M vel: 4.18 m/s
MV Peak grad: 69.9 mmHg
S' Lateral: 2.5 cm

## 2022-09-16 ENCOUNTER — Other Ambulatory Visit: Payer: Self-pay

## 2022-09-27 ENCOUNTER — Other Ambulatory Visit (HOSPITAL_COMMUNITY): Payer: Self-pay

## 2022-09-27 MED ORDER — LISDEXAMFETAMINE DIMESYLATE 70 MG PO CAPS
70.0000 mg | ORAL_CAPSULE | Freq: Every morning | ORAL | 0 refills | Status: DC
  Filled 2022-09-27: qty 11, 11d supply, fill #0
  Filled 2022-09-27: qty 19, 19d supply, fill #0

## 2022-09-29 ENCOUNTER — Other Ambulatory Visit (HOSPITAL_COMMUNITY): Payer: Self-pay

## 2022-10-09 ENCOUNTER — Other Ambulatory Visit: Payer: Self-pay

## 2022-10-09 ENCOUNTER — Other Ambulatory Visit (HOSPITAL_COMMUNITY): Payer: Self-pay

## 2022-10-09 ENCOUNTER — Other Ambulatory Visit: Payer: Self-pay | Admitting: Pharmacist

## 2022-10-09 IMAGING — MR MR HEAD W/O CM
8 of 10 series · 36 of 48 positions shown · non-contrast
Comparison: Prior CT from earlier the same day and MRI from
08/06/2020.

CLINICAL DATA: Initial evaluation for acute headache, left-sided
numbness. Cerebral volume

EXAM:
MRI HEAD WITHOUT CONTRAST
MRI CERVICAL SPINE WITHOUT CONTRAST
TECHNIQUE: Multiplanar, multiecho pulse sequences of the brain and surrounding
structures, and cervical spine, to include the craniocervical
junction and cervicothoracic junction, were obtained without
intravenous contrast.

[Series 3: DWI · axial · 3.0mm · 1.09mm/px · z∈[-71,+77]mm · 11 of 105 slices shown (1 of 4)]
[im 1/105]
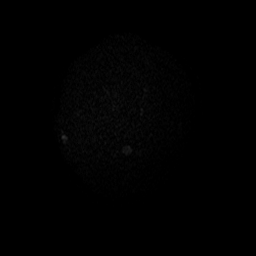
[im 11/105]
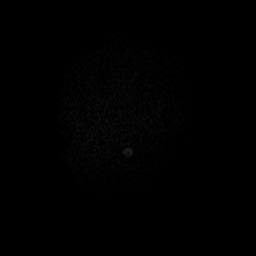
[im 21/105]
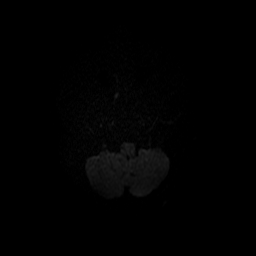
[im 32/105]
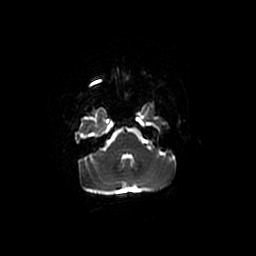
[im 42/105]
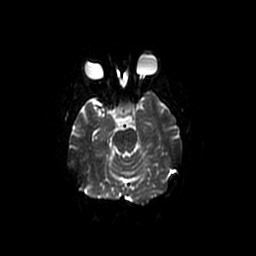
[im 53/105]
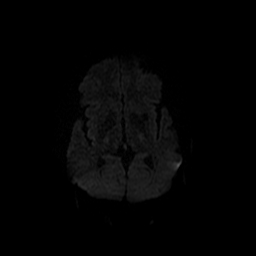
[im 63/105]
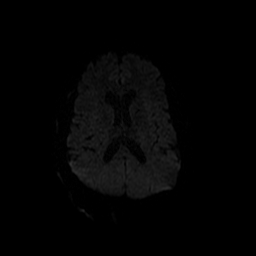
[im 73/105]
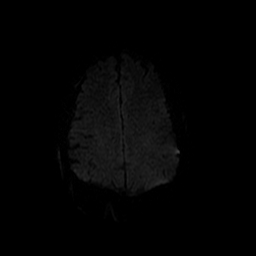
[im 84/105]
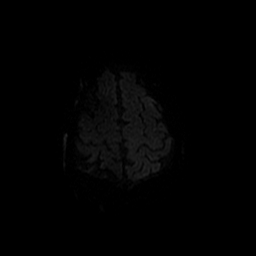
[im 94/105]
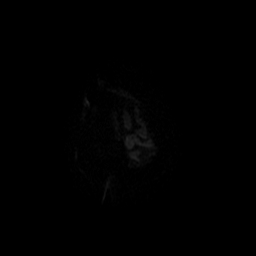
[im 105/105]
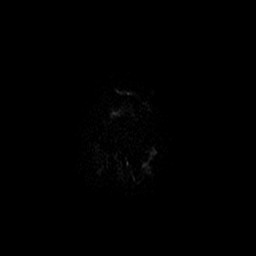

[Series 4: DWI · coronal · 5.0mm · 1.09mm/px · 7 of 76 slices shown (2 of 4)]
[im 1/76]
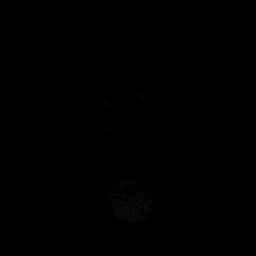
[im 13/76]
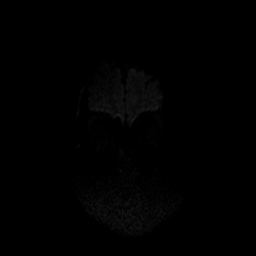
[im 26/76]
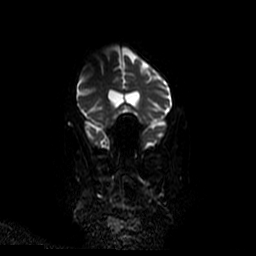
[im 38/76]
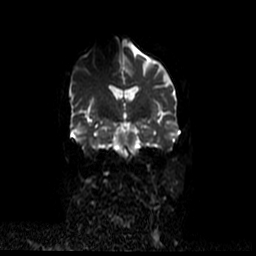
[im 51/76]
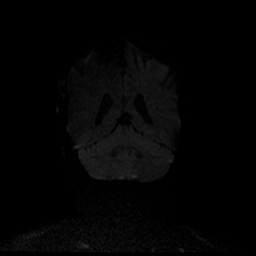
[im 63/76]
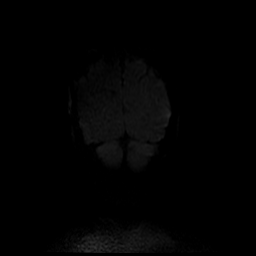
[im 76/76]
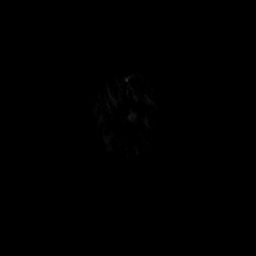

[Series 5: T1 · sagittal · 5.0mm · 0.47mm/px · 2 of 25 slices shown]
[im 1/25]
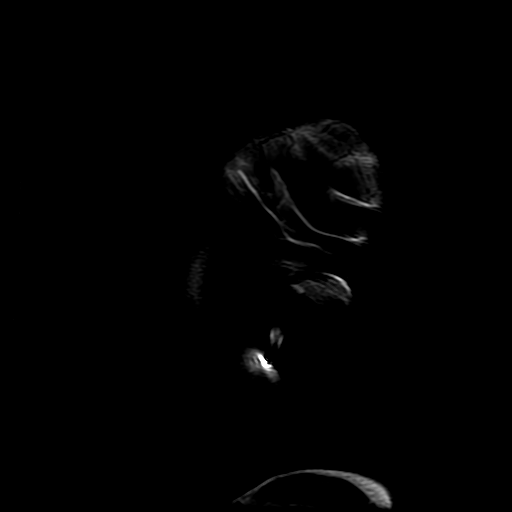
[im 25/25]
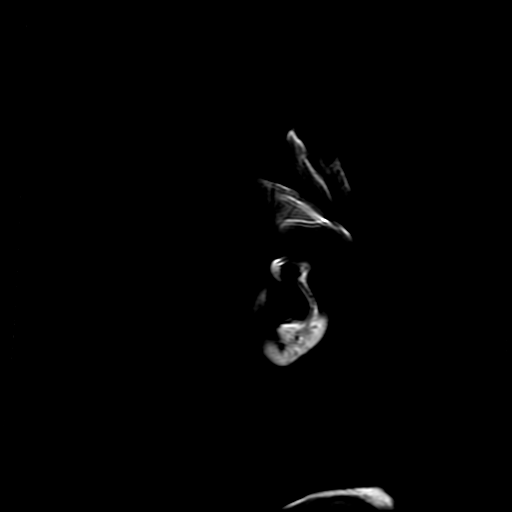

[Series 6: T2 · axial · 5.0mm · 0.45mm/px · z∈[-65,+76]mm · 2 of 26 slices shown (1 of 2)]
[im 1/26]
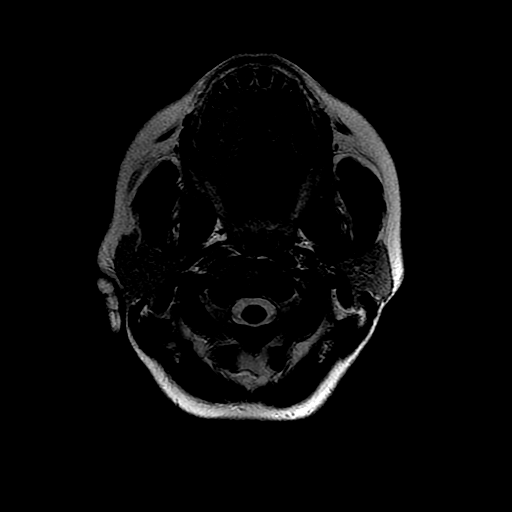
[im 26/26]
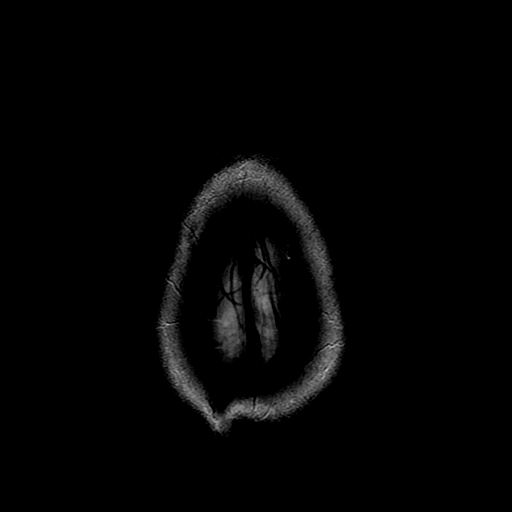

[Series 7: FLAIR · axial · 3.0mm · 0.45mm/px · z∈[-65,+76]mm · 2 of 26 slices shown]
[im 1/26]
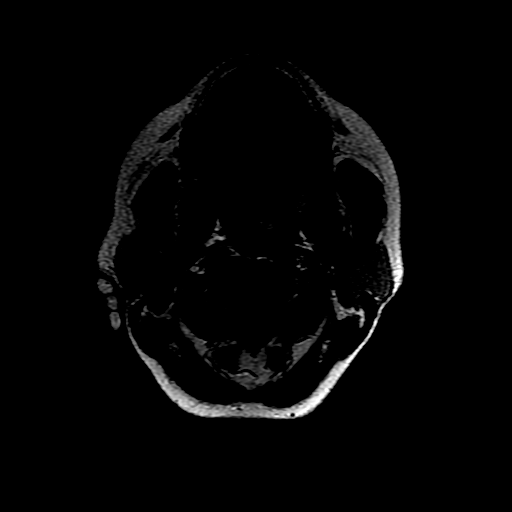
[im 26/26]
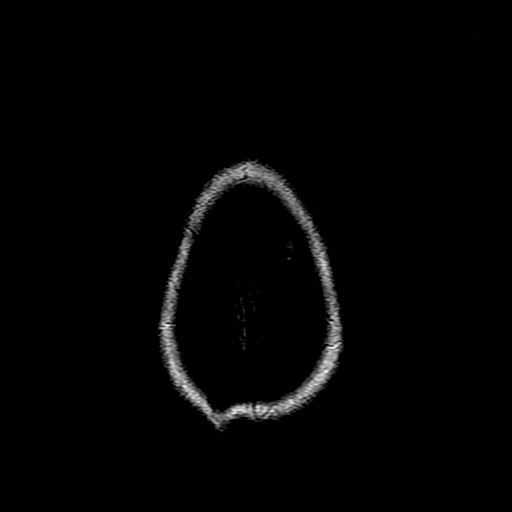

[Series 10: T2 · coronal · 5.0mm · 0.39mm/px · 3 of 29 slices shown (2 of 2)]
[im 1/29]
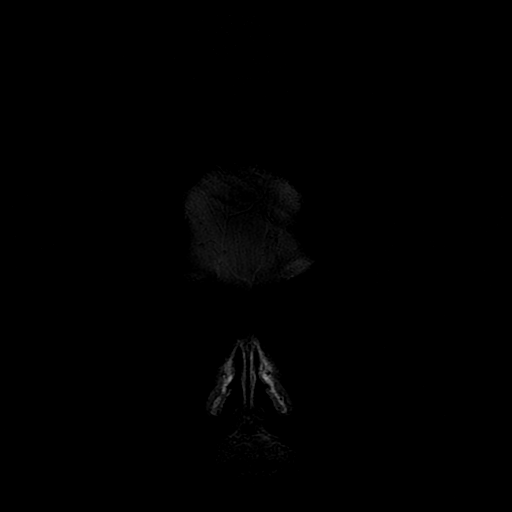
[im 15/29]
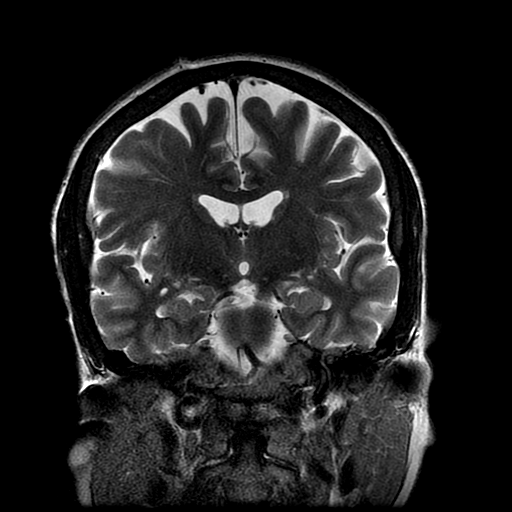
[im 29/29]
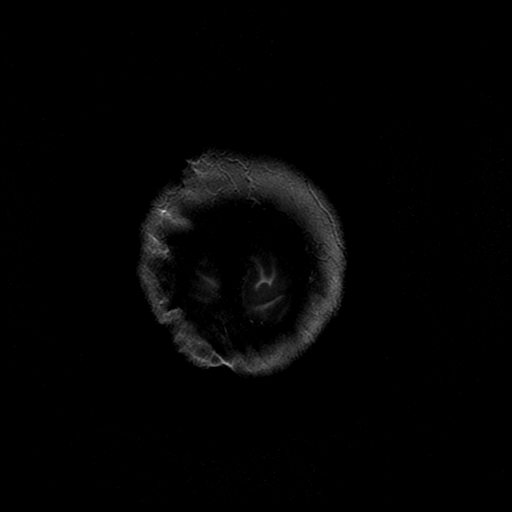

[Series 300: DWI · axial · 3.0mm · 1.09mm/px · z∈[-71,+77]mm · 5 of 53 slices shown (3 of 4)]
[im 1/53]
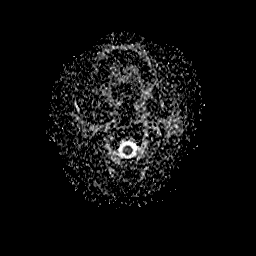
[im 14/53]
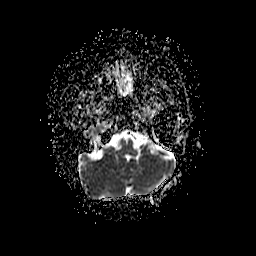
[im 27/53]
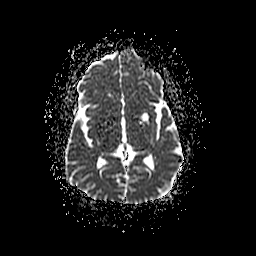
[im 40/53]
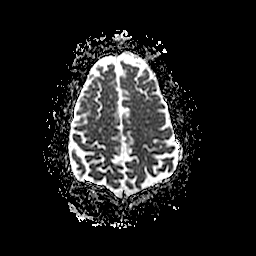
[im 53/53]
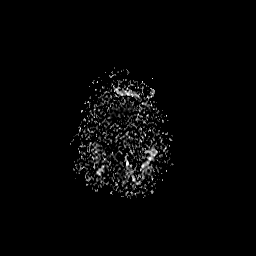

[Series 400: DWI · coronal · 5.0mm · 1.09mm/px · 4 of 37 slices shown (4 of 4)]
[im 1/37]
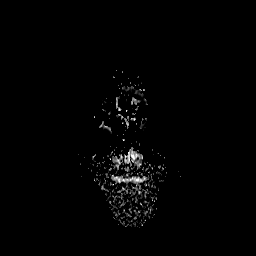
[im 13/37]
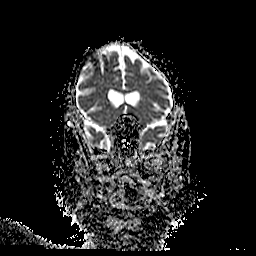
[im 25/37]
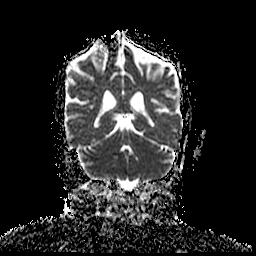
[im 37/37]
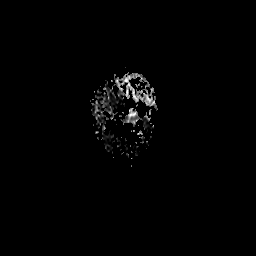

[36 of 48 positions shown; findings below may reference images not displayed]

FINDINGS: MRI HEAD FINDINGS

Brain: Cerebral volume within normal limits. No significant cerebral
white matter disease or other focal parenchymal signal abnormality.
Dilated perivascular space noted at the inferior left basal ganglia.

No evidence for acute or subacute ischemia. Gray-white matter
differentiation maintained. No areas of chronic cortical infarction.
No acute or chronic intracranial blood products.

No mass lesion, midline shift or mass effect. No hydrocephalus or
extra-axial fluid collection. Pituitary gland suprasellar region
normal. Midline structures intact and normal.

Vascular: Major intracranial vascular flow voids are maintained.

Skull and upper cervical spine: Craniocervical junction normal. Bone
marrow signal intensity within normal limits. No visible scalp soft
tissue abnormality.

Sinuses/Orbits: Globes and orbital soft tissues within normal
limits. Paranasal sinuses are clear. No significant mastoid
effusion. Inner ear structures grossly normal.

Other: None.

MRI CERVICAL SPINE FINDINGS

Alignment: Straightening with mild reversal of the normal cervical
lordosis. No listhesis.

Vertebrae: Vertebral body height maintained without acute or chronic
fracture. Bone marrow signal intensity within normal limits. No
discrete or worrisome osseous lesions. Mild discogenic reactive
endplate change present about the C5-6 and C6-7 interspaces. No
other abnormal marrow edema.

Cord: Normal signal and morphology.

Posterior Fossa, vertebral arteries, paraspinal tissues:
Unremarkable.

Disc levels:

C2-C3: Normal interspace. Left greater than right facet hypertrophy.
No spinal stenosis. Foramina remain patent.

C3-C4: Normal interspace. Mild left greater than right facet
hypertrophy. No significant canal or foraminal stenosis.

C4-C5: Small right paracentral disc protrusion indents the right
ventral thecal sac (series 6, image 12). Mild flattening of the
right ventral cord without cord signal changes. Borderline mild
spinal stenosis. Foramina remain patent.

C5-C6: Moderate degenerative intervertebral disc space narrowing
with diffuse disc osteophyte complex. Broad posterior component
flattens and effaces the ventral thecal sac. Mild flattening of the
ventral cord without cord signal changes. Mild to moderate spinal
stenosis. Moderate bilateral C6 foraminal narrowing, slightly worse
on the right.

C6-C7: Moderate degenerative intervertebral disc space narrowing
with diffuse disc osteophyte complex. Broad posterior component
flattens and effaces the ventral thecal sac. Minimal cord flattening
without cord signal changes. Mild to moderate spinal stenosis with
moderate bilateral C7 foraminal narrowing.

C7-T1: Normal interspace. Mild to moderate bilateral facet
hypertrophy. No spinal stenosis. Foramina remain patent.

Visualized upper thoracic spine demonstrates mild noncompressive
disc bulging at T3-4 through T5-6. No significant stenosis.
IMPRESSION: MRI HEAD IMPRESSION:

Normal brain MRI. No acute intracranial abnormality identified.

MRI CERVICAL SPINE IMPRESSION:

1. No acute abnormality within the cervical spine or spinal cord.
2. Degenerative spondylosis at C5-6 and C6-7 with resultant mild to
moderate spinal stenosis, with moderate bilateral C6 and C7
foraminal narrowing.
3. Small right paracentral disc protrusion at C4-5 with resultant
mild flattening of the right hemi cord, but no cord signal changes
or significant stenosis.

## 2022-10-09 MED ORDER — RINVOQ 15 MG PO TB24
ORAL_TABLET | ORAL | 1 refills | Status: DC
  Filled 2022-10-09: qty 30, 30d supply, fill #0
  Filled 2022-11-17: qty 30, 30d supply, fill #1

## 2022-10-09 MED ORDER — RINVOQ 15 MG PO TB24: ORAL_TABLET | ORAL | 1 refills | Status: DC

## 2022-10-10 ENCOUNTER — Other Ambulatory Visit (HOSPITAL_COMMUNITY): Payer: Self-pay

## 2022-10-10 ENCOUNTER — Other Ambulatory Visit: Payer: Self-pay

## 2022-10-10 MED ORDER — RINVOQ 15 MG PO TB24: 15.0000 mg | ORAL_TABLET | Freq: Every day | ORAL | 1 refills | Status: DC

## 2022-10-11 ENCOUNTER — Other Ambulatory Visit: Payer: Self-pay

## 2022-10-11 ENCOUNTER — Emergency Department (HOSPITAL_BASED_OUTPATIENT_CLINIC_OR_DEPARTMENT_OTHER)
Admission: EM | Admit: 2022-10-11 | Discharge: 2022-10-11 | Disposition: A | Discharge status: Home / Self Care | Payer: 59 | Attending: Emergency Medicine | Admitting: Emergency Medicine

## 2022-10-11 ENCOUNTER — Other Ambulatory Visit (HOSPITAL_COMMUNITY): Payer: Self-pay

## 2022-10-11 ENCOUNTER — Encounter (HOSPITAL_BASED_OUTPATIENT_CLINIC_OR_DEPARTMENT_OTHER): Payer: Self-pay

## 2022-10-11 DIAGNOSIS — R059 Cough, unspecified: Secondary | ICD-10-CM | POA: Diagnosis present

## 2022-10-11 DIAGNOSIS — Z79899 Other long term (current) drug therapy: Secondary | ICD-10-CM | POA: Diagnosis not present

## 2022-10-11 DIAGNOSIS — J101 Influenza due to other identified influenza virus with other respiratory manifestations: Secondary | ICD-10-CM | POA: Insufficient documentation

## 2022-10-11 DIAGNOSIS — Z1152 Encounter for screening for COVID-19: Secondary | ICD-10-CM | POA: Diagnosis not present

## 2022-10-11 DIAGNOSIS — Z7982 Long term (current) use of aspirin: Secondary | ICD-10-CM | POA: Insufficient documentation

## 2022-10-11 LAB — RESP PANEL BY RT-PCR (RSV, FLU A&B, COVID)  RVPGX2
Influenza A by PCR: POSITIVE — AB
Influenza B by PCR: NEGATIVE
Resp Syncytial Virus by PCR: NEGATIVE
SARS Coronavirus 2 by RT PCR: NEGATIVE

## 2022-10-11 MED ORDER — XOFLUZA (40 MG DOSE) 1 X 40 MG PO TBPK
40.0000 mg | ORAL_TABLET | Freq: Once | ORAL | 0 refills | Status: DC
  Filled 2022-10-11: qty 1, 1d supply, fill #0

## 2022-10-11 MED ORDER — XOFLUZA (40 MG DOSE) 1 X 40 MG PO TBPK
ORAL_TABLET | ORAL | 0 refills | Status: DC
  Filled 2022-10-11: qty 1, 1d supply, fill #0

## 2022-10-11 MED ORDER — XOFLUZA (40 MG DOSE) 1 X 40 MG PO TBPK: 40.0000 mg | ORAL_TABLET | Freq: Once | ORAL | 0 refills | Status: AC

## 2022-10-11 NOTE — ED Provider Notes (Signed)
[place] Provider Note   CSN: [id number] Arrival date & time: 10/11/22  1001     History  Chief Complaint  Patient presents with   Cough    Natalie Ford is a 52 y.o. female.  52 year old for female presents with complaint of scratchy throat, cough, body aches with chills.  Symptoms started last night, worse this morning.  Exposed to others who have had [place].  Has a history of RA, is on DMARDs.       Home Medications Prior to Admission medications   Medication Sig Start Date End Date Taking? Authorizing Provider  albuterol (VENTOLIN HFA) 108 (90 Base) MCG/ACT inhaler Inhale 1-2 puffs into the lungs every 6 (six) hours as needed for wheezing or shortness of breath. 12/20/19  Yes Spero Geralds, MD  aspirin EC 81 MG EC tablet Take 1 tablet (81 mg total) by mouth daily. Swallow whole. 08/08/20  Yes Rinehuls, Early Chars, [name]  buPROPion (WELLBUTRIN XL) 150 MG 24 hr tablet TAKE THREE TABLETS BY MOUTH EVERY MORNING 07/28/22  Yes Cottle, Billey Co., MD  cycloSPORINE, PF, (CEQUA) 0.09 % SOLN Place 1 drop into both eyes 2 times a day 04/04/21  Yes   diclofenac Sodium (VOLTAREN ARTHRITIS PAIN) 1 % GEL Apply 2 g topically 4 (four) times daily. 06/10/22  Yes Raulkar, Clide Deutscher, MD  fluticasone (FLONASE) 50 MCG/ACT nasal spray Place 2 sprays into both nostrils daily.   Yes [provider]  fluticasone-salmeterol (ADVAIR HFA) 230-21 MCG/ACT inhaler Inhale 2 puffs into the lungs 2 (two) times daily. 01/26/20  Yes Spero Geralds, MD  gabapentin (NEURONTIN) 300 MG capsule Take 400 mg by mouth 3 (three) times daily as needed. 02/24/20  Yes [provider]  gabapentin (NEURONTIN) 400 MG capsule    Yes [provider]  ipratropium (ATROVENT) 0.03 % nasal spray Place 2 sprays into both nostrils every 12 (twelve) hours. 08/26/22  Yes Brunetta Jeans, [name]  lidocaine (LIDODERM) 5 % Place 1 patch onto the skin daily. Remove &  Discard patch within 12 hours or as directed by MD 06/10/22  Yes Raulkar, Clide Deutscher, MD  lisdexamfetamine (VYVANSE) 70 MG capsule Take 1 capsule (70 mg total) by mouth in the morning. 02/21/22  Yes Cottle, Billey Co., MD  lisdexamfetamine (VYVANSE) 70 MG capsule Take 1 capsule (70 mg total) by mouth every morning. 09/24/22  Yes Cottle, Billey Co., MD  Multiple Vitamin (MULTIVITAMIN) capsule Take 1 capsule by mouth at bedtime.   Yes [provider]  NURTEC 75 MG TBDP PLACE ONE TABLET BY MOUTH DAILY AS NEEDED 04/07/22  Yes McCue, Janett Billow, NP  Omega-3 Fatty Acids (FISH OIL) 1000 MG CAPS Take 2 capsules by mouth daily.   Yes [provider]  predniSONE (DELTASONE) 1 MG tablet TAKE 4 TABLETS BY MOUTH EVERY MORNING WITH BREAKFAST 02/19/22  Yes Raulkar, Clide Deutscher, MD  predniSONE (DELTASONE) 10 MG tablet Take 8 mg by mouth. 04/05/22  Yes [provider]  propranolol (INDERAL) 40 MG tablet Take 1 tablet (40 mg total) by mouth 3 (three) times daily. 09/16/21  Yes McCue, Janett Billow, NP  rosuvastatin (CRESTOR) 10 MG tablet Take 10 mg by mouth daily.   Yes [provider]  spironolactone (ALDACTONE) 50 MG tablet Take 75 mg by mouth daily. 03/06/20  Yes [provider]  Upadacitinib ER (RINVOQ) 15 MG TB24 1 tablet Orally Once a day 30 days 10/09/22  Yes Jegede, Olugbemiga E,  MD  Upadacitinib ER (RINVOQ) 15 MG TB24 Take 1 tablet (15 mg total) by mouth daily. 10/10/22  Yes   Baloxavir Marboxil,40 MG Dose, (XOFLUZA, 40 MG DOSE,) 1 x 40 MG TBPK Take 40 mg by mouth once for 1 dose. 10/11/22 10/11/22  Tacy Learn, [name]  Baloxavir Marboxil,40 MG Dose, (XOFLUZA, 40 MG DOSE,) 1 x 40 MG TBPK Take 61m by mouth once for 1 dose 10/11/22   MTacy Learn [name]      Allergies    Penicillins    Review of Systems   Review of Systems Negative except as per HPI Physical Exam Updated Vital Signs BP 104/76 (BP Location: Right Arm)   Pulse 76   Temp 98.6 F (37 C) (Oral)   Resp 16    Ht 5' (1.524 m)   Wt 55.3 kg   LMP 08/08/2021 (Approximate)   SpO2 98%   BMI 23.83 kg/m  Physical Exam Vitals and nursing note reviewed.  Constitutional:      General: She is not in acute distress.    Appearance: She is well-developed. She is not diaphoretic.  HENT:     Head: Normocephalic and atraumatic.  Cardiovascular:     Rate and Rhythm: Normal rate and regular rhythm.     Heart sounds: Normal heart sounds.  Pulmonary:     Effort: Pulmonary effort is normal.     Breath sounds: Normal breath sounds.  Skin:    General: Skin is warm and dry.     Findings: No erythema or rash.  Neurological:     Mental Status: She is alert and oriented to person, place, and time.  Psychiatric:        Behavior: Behavior normal.     ED Results / Procedures / Treatments   Labs (all labs ordered are listed, but only abnormal results are displayed) Labs Reviewed  RESP PANEL BY RT-PCR (RSV, FLU A&B, COVID)  RVPGX2 - Abnormal; Notable for the following components:      Result Value   Influenza A by PCR POSITIVE (*)    All other components within normal limits    EKG None  Radiology No results found.  Procedures Procedures    Medications Ordered in ED Medications - No data to display  ED Course/ Medical Decision Making/ A&P                             Medical Decision Making Risk Prescription drug management.   52year old female presents with complaint of scratchy throat, body aches, cough and chills onset last night, worse today.  Reports feeling like she is hit by a truck.  Concerned due to exposure to those who have tested positive for COVID.  Patient has RA, is on DMARDs.  On exam, appears to feel unwell although nontoxic, in no distress.  O2 sat 98% on room air, afebrile, lungs clear to auscultation, exam is otherwise unremarkable.  Patient is positive for influenza A, negative for flu B, COVID, RSV.  As patient has presented within the first 48 hours of illness, recommend  Xofluza, discussed difficulty sometimes in obtaining this medication.  She is provided with a prescription to her preferred pharmacy as well as printed prescription should she need to find this medication elsewhere.  Advised this medication is only helpful start in the first 48 hours of symptoms.  Recommend recheck with PCP as needed, return to ER for worsening or concerning symptoms.  Final Clinical Impression(s) / ED Diagnoses Final diagnoses:  Influenza A    Rx / DC Orders ED Discharge Orders          Ordered    Baloxavir Marboxil,40 MG Dose, (XOFLUZA, 40 MG DOSE,) 1 x 40 MG TBPK   Once,   Status:  Discontinued        10/11/22 1151    Baloxavir Marboxil,40 MG Dose, (XOFLUZA, 40 MG DOSE,) 1 x 40 MG TBPK   Once        10/11/22 1153              Roque Lias 10/11/22 1522    Fredia Sorrow, MD 10/15/22 [phone]

## 2022-10-11 NOTE — ED Triage Notes (Signed)
In for eval of hoarse voice, cough, right ear pain, slight headache, bodyaches onset yesterday. Negative home covid test yesterday. Denies runny nose or sneezing. 2 people out of office with covid. Denies n/v/d.

## 2022-10-11 NOTE — Discharge Instructions (Signed)
Home to rest and hydrate.  Motrin and Tylenol as needed as directed for fevers and bodyaches.  Tessalon as needed for cough.  Marlis Edelson is a medication that can help with the flu if started within the first 48 hours of symptoms.  This prescription has been sent to your pharmacy.  I have also attached a printed copy in case you find this medication at a different pharmacy.

## 2022-10-30 ENCOUNTER — Other Ambulatory Visit (HOSPITAL_COMMUNITY): Payer: Self-pay

## 2022-10-30 MED ORDER — LISDEXAMFETAMINE DIMESYLATE 70 MG PO CAPS
70.0000 mg | ORAL_CAPSULE | ORAL | 0 refills | Status: DC
  Filled 2022-10-30: qty 90, 90d supply, fill #0

## 2022-10-31 ENCOUNTER — Other Ambulatory Visit (HOSPITAL_COMMUNITY): Payer: Self-pay

## 2022-10-31 ENCOUNTER — Encounter: Payer: 59 | Admitting: Physical Medicine and Rehabilitation

## 2022-11-04 DIAGNOSIS — M2550 Pain in unspecified joint: Secondary | ICD-10-CM | POA: Diagnosis not present

## 2022-11-04 DIAGNOSIS — Z79899 Other long term (current) drug therapy: Secondary | ICD-10-CM | POA: Diagnosis not present

## 2022-11-04 DIAGNOSIS — M8589 Other specified disorders of bone density and structure, multiple sites: Secondary | ICD-10-CM | POA: Diagnosis not present

## 2022-11-04 DIAGNOSIS — R5383 Other fatigue: Secondary | ICD-10-CM | POA: Diagnosis not present

## 2022-11-04 DIAGNOSIS — M0609 Rheumatoid arthritis without rheumatoid factor, multiple sites: Secondary | ICD-10-CM | POA: Diagnosis not present

## 2022-11-05 ENCOUNTER — Other Ambulatory Visit (HOSPITAL_COMMUNITY): Payer: Self-pay

## 2022-11-07 ENCOUNTER — Other Ambulatory Visit (HOSPITAL_COMMUNITY): Payer: Self-pay

## 2022-11-11 ENCOUNTER — Other Ambulatory Visit (HOSPITAL_COMMUNITY): Payer: Self-pay

## 2022-11-13 ENCOUNTER — Other Ambulatory Visit (HOSPITAL_COMMUNITY): Payer: Self-pay

## 2022-11-17 ENCOUNTER — Other Ambulatory Visit (HOSPITAL_COMMUNITY): Payer: Self-pay

## 2022-11-17 ENCOUNTER — Other Ambulatory Visit: Payer: Self-pay

## 2022-11-18 ENCOUNTER — Other Ambulatory Visit (HOSPITAL_COMMUNITY): Payer: Self-pay

## 2022-11-18 ENCOUNTER — Other Ambulatory Visit: Payer: Self-pay

## 2022-11-19 ENCOUNTER — Other Ambulatory Visit (HOSPITAL_COMMUNITY): Payer: Self-pay

## 2022-11-19 ENCOUNTER — Other Ambulatory Visit: Payer: Self-pay

## 2022-11-20 NOTE — Progress Notes (Signed)
Cardiology Office Note:    Date:  11/21/2022   ID:  Natalie Ford, DOB [birth year], MRN [id number]  PCP:  Deland Pretty, MD  Cardiologist:  Donato Heinz, MD  Electrophysiologist:  None   Referring MD: Deland Pretty, MD   Chief complaint: dyspnea/palpitations  History of Present Illness:    Natalie Ford is a 52 y.o. female with a hx of COVID-19 infection who presents for follow-up.  She was referred by Dr. Kathlene November for evaluation of dyspnea/palpitations, initially seen on 12/19/2019.  She was diagnosed with Covid in March 2020.  Following this had myalgias, shortness of breath, and palpitations.  She saw rheumatology and was diagnosed with seronegative rheumatoid arthritis.  Started on prednisone in September 2020.  She also did a trial of methotrexate but did not tolerate.  Also has been on leflunomide.  She was suspected of having a reinfection of COVID-19 in January 2021.  She developed pneumonia in February.  She states that since that time she has been having fevers daily.  She has also been short of breath.  Reports that she walks 20 minutes for 5 days/week, but is limited by dyspnea.  Denies any chest pain currently.  Also has been having palpitations.  Described as feeling like heart is racing.  Will happen multiple times per day, can last for hours.  Has checked her heart rate and will will be in 120s.  She was seen by ID for fevers of unknown origin, underwent CT abdomen/pelvis which was unremarkable.  Never smoked.  Mother died of MI in 53s, first had PCI in 65s.   Echocardiogram on 01/03/2020 showed EF 65 to 70%, normal RV function, no significant valvular disease.  Zio patch x3 days on 01/11/2020 showed occasional PVCs (1.9% of beats).  Zio patch x 7 days 07/2022 showed occasional PVCs (2.3% of beats).  Echocardiogram 09/10/2022 showed EF 55 to 60%, normal RV function, no significant valvular disease.  Since last clinic visit, she reports she is doing okay.   Reports palpitations have improved.  She is taking propranolol.  She denies any chest pain, dyspnea, lightheadedness, syncope, lower extremity edema.  Walks on treadmill 20-30 minutes 3 times during the week and then 45-60 minutes each day on the weekend.   Past Medical History:  Diagnosis Date   Allergic rhinitis    COVID-19    x 3   Dyspepsia    Hypercholesteremia    IBS (irritable bowel syndrome)    Lumbosacral strain    RA (rheumatoid arthritis) ([place])    Stroke ([place]) 08/05/2020   tia    Past Surgical History:  Procedure Laterality Date   CARPAL TUNNEL RELEASE Right    CESAREAN SECTION     x 2    Current Medications: Current Meds  Medication Sig   albuterol (VENTOLIN HFA) 108 (90 Base) MCG/ACT inhaler Inhale 1-2 puffs into the lungs every 6 (six) hours as needed for wheezing or shortness of breath.   aspirin EC 81 MG EC tablet Take 1 tablet (81 mg total) by mouth daily. Swallow whole.   Baloxavir Marboxil,40 MG Dose, (XOFLUZA, 40 MG DOSE,) 1 x 40 MG TBPK Take 40mg  by mouth once for 1 dose   buPROPion (WELLBUTRIN XL) 150 MG 24 hr tablet TAKE THREE TABLETS BY MOUTH EVERY MORNING   cycloSPORINE, PF, (CEQUA) 0.09 % SOLN Place 1 drop into both eyes 2 times a day   diclofenac Sodium (VOLTAREN ARTHRITIS PAIN) 1 % GEL Apply 2 g topically 4 (  four) times daily.   fluticasone (FLONASE) 50 MCG/ACT nasal spray Place 2 sprays into both nostrils daily.   fluticasone-salmeterol (ADVAIR HFA) 230-21 MCG/ACT inhaler Inhale 2 puffs into the lungs 2 (two) times daily.   gabapentin (NEURONTIN) 400 MG capsule    ipratropium (ATROVENT) 0.03 % nasal spray Place 2 sprays into both nostrils every 12 (twelve) hours.   lidocaine (LIDODERM) 5 % Place 1 patch onto the skin daily. Remove & Discard patch within 12 hours or as directed by MD   lisdexamfetamine (VYVANSE) 70 MG capsule Take 1 capsule (70 mg total) by mouth in the morning.   lisdexamfetamine (VYVANSE) 70 MG capsule Take 1 capsule (70 mg total)  by mouth every morning.   Multiple Vitamin (MULTIVITAMIN) capsule Take 1 capsule by mouth at bedtime.   NURTEC 75 MG TBDP PLACE ONE TABLET BY MOUTH DAILY AS NEEDED   Omega-3 Fatty Acids (FISH OIL) 1000 MG CAPS Take 2 capsules by mouth daily.   predniSONE (DELTASONE) 1 MG tablet TAKE 4 TABLETS BY MOUTH EVERY MORNING WITH BREAKFAST   propranolol (INDERAL) 40 MG tablet Take 1 tablet (40 mg total) by mouth 3 (three) times daily.   rosuvastatin (CRESTOR) 10 MG tablet Take 10 mg by mouth daily.   spironolactone (ALDACTONE) 50 MG tablet Take 75 mg by mouth daily.   Upadacitinib ER (RINVOQ) 15 MG TB24 1 tablet Orally Once a day 30 days   Current Facility-Administered Medications for the 11/21/22 encounter (Office Visit) with Donato Heinz, MD  Medication   sodium chloride (PF) 0.9 % injection 2 mL     Allergies:   Penicillins   Social History   Socioeconomic History   Marital status: Married    Spouse name: Not on file   Number of children: 2   Years of education: Not on file   Highest education level: Not on file  Occupational History   Occupation: Therapist, sports, Multimedia programmer: [employer]   Occupation: Therapist, sports, Copywriter, advertising    Comment: [employer]  Tobacco Use   Smoking status: Never   Smokeless tobacco: Never   Tobacco comments:    exposed to second hand smoke  Vaping Use   Vaping Use: Never used  Substance and Sexual Activity   Alcohol use: Yes    Comment: social use   Drug use: No   Sexual activity: Not Currently  Other Topics Concern   Not on file  Social History Narrative   Lives with 2 children and husband   Right Handed   Drinks 1-2 cups caffeine daily   Social Determinants of Health   Financial Resource Strain: Not on file  Food Insecurity: Not on file  Transportation Needs: Not on file  Physical Activity: Not on file  Stress: Not on file  Social Connections: Not on file     Family History: The patient's family history includes Diabetes in  her mother and sister; Heart disease in her mother; Kidney disease in her paternal aunt; Leukemia in her father; Stomach cancer in her paternal grandfather; Stroke in her mother.  ROS:   Please see the history of present illness.     All other systems reviewed and are negative.  EKGs/Labs/Other Studies Reviewed:    The following studies were reviewed today:   EKG:   08/13/2022: Sinus rhythm with frequent PVCs, right axis deviation, nonspecific T wave flattening, rate 86  Recent Labs: 08/13/2022: ALT 27; BUN 23; Creatinine, Ser 0.84; Hemoglobin 14.9; Magnesium 2.3; Platelets 317;  Potassium 4.5; Sodium 139; TSH 1.360  Recent Lipid Panel    Component Value Date/Time   CHOL 184 08/13/2022 1139   TRIG 142 08/13/2022 1139   TRIG 65 08/21/2006 0923   HDL 97 08/13/2022 1139   CHOLHDL 1.9 08/13/2022 1139   CHOLHDL 2.8 08/06/2020 0336   VLDL 17 08/06/2020 0336   LDLCALC 64 08/13/2022 1139   LDLDIRECT 123.2 07/13/2007 [id number]    Physical Exam:    VS:  BP 104/60 (BP Location: Left Arm, Patient Position: Sitting, Cuff Size: Normal)   Pulse 68   Ht 5' (1.524 m)   Wt 129 lb 12.8 oz (58.9 kg)   LMP 08/08/2021 (Approximate)   SpO2 95%   BMI 25.35 kg/m     Wt Readings from Last 3 Encounters:  11/21/22 129 lb 12.8 oz (58.9 kg)  10/11/22 122 lb (55.3 kg)  08/29/22 128 lb (58.1 kg)     GEN:  Well nourished, well developed in no acute distress HEENT: Normal NECK: No JVD CARDIAC: irregular, normal rate, no murmurs, rubs, gallops RESPIRATORY:  Clear to auscultation without rales, wheezing or rhonchi  ABDOMEN: Soft, non-tender, non-distended MUSCULOSKELETAL:  No edema SKIN: Warm and dry NEUROLOGIC:  Alert and oriented x 3 PSYCHIATRIC:  Normal affect   ASSESSMENT:    1. Palpitations   2. PVC's (premature ventricular contractions)   3. Hyperlipidemia, unspecified hyperlipidemia type      PLAN:    Palpitations/PVCs: Reported palpitations, Zio patch x3 days on 01/11/2020 showed  occasional PVCs (1.9% of beats).  Echo unremarkable 01/03/2020.  Reported worsening palpitations and clinic visit 07/2022  Zio patch x 7 days 07/2022 showed occasional PVCs (2.3% of beats).  Echocardiogram 09/10/2022 showed EF 55 to 60%, normal RV function, no significant valvular disease. -She is prescribed propranolol 40 mg 3 times daily for her migraine/palpitations -Taking prednisone for RA which maybe contributing, is tapering off -Taking Vyvanse for ADHD which also may be contributing  -Reports palpitations have improved, no further workup at this time  Hyperlipidemia: On rosuvastatin 10 mg daily.  LDL 64 on 08/13/2022.  RTC in 1 year   Medication Adjustments/Labs and Tests Ordered: Current medicines are reviewed at length with the patient today.  Concerns regarding medicines are outlined above.  No orders of the defined types were placed in this encounter.  No orders of the defined types were placed in this encounter.   Patient Instructions  Medication Instructions:  No Changes In Medications at this time.  *If you need a refill on your cardiac medications before your next appointment, please call your pharmacy*  Follow-Up: At [facility], you and your health needs are our priority.  As part of our continuing mission to provide you with exceptional heart care, we have created designated Provider Care Teams.  These Care Teams include your primary Cardiologist (physician) and Advanced Practice Providers (APPs -  Physician Assistants and Nurse Practitioners) who all work together to provide you with the care you need, when you need it.  Your next appointment:   1 year(s)  Provider:   Donato Heinz, MD      Signed, Donato Heinz, MD  11/21/2022 8:22 AM    [place]

## 2022-11-21 ENCOUNTER — Ambulatory Visit: Payer: 59 | Attending: Cardiology | Admitting: Cardiology

## 2022-11-21 ENCOUNTER — Encounter: Payer: Self-pay | Admitting: Cardiology

## 2022-11-21 VITALS — BP 104/60 | HR 68 | Ht 60.0 in | Wt 129.8 lb

## 2022-11-21 DIAGNOSIS — I493 Ventricular premature depolarization: Secondary | ICD-10-CM | POA: Diagnosis not present

## 2022-11-21 DIAGNOSIS — R002 Palpitations: Secondary | ICD-10-CM | POA: Diagnosis not present

## 2022-11-21 DIAGNOSIS — E785 Hyperlipidemia, unspecified: Secondary | ICD-10-CM | POA: Diagnosis not present

## 2022-11-21 NOTE — Addendum Note (Signed)
Addended by: Amalia Hailey on: 11/21/2022 08:46 AM   Modules accepted: Orders

## 2022-11-21 NOTE — Patient Instructions (Signed)
Medication Instructions:  No Changes In Medications at this time.  *If you need a refill on your cardiac medications before your next appointment, please call your pharmacy*  Follow-Up: At [facility], you and your health needs are our priority.  As part of our continuing mission to provide you with exceptional heart care, we have created designated Provider Care Teams.  These Care Teams include your primary Cardiologist (physician) and Advanced Practice Providers (APPs -  Physician Assistants and Nurse Practitioners) who all work together to provide you with the care you need, when you need it.  Your next appointment:   1 year(s)  Provider:   Donato Heinz, MD

## 2022-12-04 ENCOUNTER — Other Ambulatory Visit (HOSPITAL_COMMUNITY): Payer: Self-pay

## 2022-12-08 ENCOUNTER — Other Ambulatory Visit: Payer: Self-pay

## 2022-12-10 ENCOUNTER — Other Ambulatory Visit: Payer: Self-pay

## 2022-12-18 ENCOUNTER — Other Ambulatory Visit (HOSPITAL_COMMUNITY): Payer: Self-pay

## 2022-12-19 ENCOUNTER — Encounter: Payer: 59 | Attending: Physical Medicine and Rehabilitation | Admitting: Physical Medicine and Rehabilitation

## 2022-12-19 ENCOUNTER — Encounter: Payer: Self-pay | Admitting: Physical Medicine and Rehabilitation

## 2022-12-19 VITALS — BP 114/79 | HR 73 | Ht 60.0 in | Wt 130.0 lb

## 2022-12-19 DIAGNOSIS — M7918 Myalgia, other site: Secondary | ICD-10-CM | POA: Diagnosis not present

## 2022-12-19 MED ORDER — LIDOCAINE HCL 1 % IJ SOLN: 3.0000 mL | Freq: Once | INTRAMUSCULAR | Status: AC

## 2022-12-19 NOTE — Progress Notes (Signed)
Trigger Point Injection  Indication: Cervical myofascial pain not relieved by medication management and other conservative care.  Informed consent was obtained after describing risk and benefits of the procedure with the patient, this includes bleeding, bruising, infection and medication side effects.  The patient wishes to proceed and has given written consent.  The patient was placed in a seated position.  The area of pain was marked and prepped with Betadine.  It was entered with a 25-gauge 1/2 inch needle and a total of 5 mL of 1% lidocaine and normal saline was injected into a total of 5 trigger points, after negative draw back for blood.  The patient tolerated the procedure well.  Post procedure instructions were given.   Prescribed Zynex Nexwave, cervical traction device, and heating/cooling blanket

## 2022-12-25 ENCOUNTER — Other Ambulatory Visit (HOSPITAL_COMMUNITY): Payer: Self-pay

## 2022-12-25 ENCOUNTER — Other Ambulatory Visit: Payer: Self-pay | Admitting: Pharmacist

## 2022-12-25 ENCOUNTER — Other Ambulatory Visit: Payer: Self-pay

## 2022-12-25 DIAGNOSIS — Z1389 Encounter for screening for other disorder: Secondary | ICD-10-CM | POA: Diagnosis not present

## 2022-12-25 DIAGNOSIS — Z1151 Encounter for screening for human papillomavirus (HPV): Secondary | ICD-10-CM | POA: Diagnosis not present

## 2022-12-25 DIAGNOSIS — Z01419 Encounter for gynecological examination (general) (routine) without abnormal findings: Secondary | ICD-10-CM | POA: Diagnosis not present

## 2022-12-25 DIAGNOSIS — R102 Pelvic and perineal pain: Secondary | ICD-10-CM | POA: Diagnosis not present

## 2022-12-25 DIAGNOSIS — Z124 Encounter for screening for malignant neoplasm of cervix: Secondary | ICD-10-CM | POA: Diagnosis not present

## 2022-12-25 MED ORDER — RINVOQ 15 MG PO TB24
ORAL_TABLET | ORAL | 1 refills | Status: DC
  Filled 2022-12-25: qty 30, 30d supply, fill #0
  Filled 2023-01-22: qty 30, 30d supply, fill #1

## 2022-12-25 MED ORDER — RINVOQ 15 MG PO TB24: ORAL_TABLET | ORAL | 1 refills | Status: DC

## 2022-12-30 ENCOUNTER — Telehealth: Payer: 59 | Admitting: Physician Assistant

## 2022-12-30 DIAGNOSIS — J019 Acute sinusitis, unspecified: Secondary | ICD-10-CM

## 2022-12-30 DIAGNOSIS — B9689 Other specified bacterial agents as the cause of diseases classified elsewhere: Secondary | ICD-10-CM | POA: Diagnosis not present

## 2022-12-30 MED ORDER — BENZONATATE 100 MG PO CAPS: 100.0000 mg | ORAL_CAPSULE | Freq: Three times a day (TID) | ORAL | 0 refills | Status: DC | PRN

## 2022-12-30 MED ORDER — DOXYCYCLINE HYCLATE 100 MG PO TABS: 100.0000 mg | ORAL_TABLET | Freq: Two times a day (BID) | ORAL | 0 refills | Status: DC

## 2022-12-30 NOTE — Progress Notes (Signed)
I have spent 5 minutes in review of e-visit questionnaire, review and updating patient chart, medical decision making and response to patient.   Pius Byrom Cody Allyana Vogan, [name]    

## 2022-12-30 NOTE — Progress Notes (Signed)

## 2022-12-31 DIAGNOSIS — Z1231 Encounter for screening mammogram for malignant neoplasm of breast: Secondary | ICD-10-CM | POA: Diagnosis not present

## 2022-12-31 DIAGNOSIS — R102 Pelvic and perineal pain: Secondary | ICD-10-CM | POA: Diagnosis not present

## 2023-01-08 ENCOUNTER — Other Ambulatory Visit: Payer: Self-pay | Admitting: Psychiatry

## 2023-01-12 ENCOUNTER — Other Ambulatory Visit: Payer: Self-pay | Admitting: Adult Health

## 2023-01-20 ENCOUNTER — Other Ambulatory Visit (HOSPITAL_COMMUNITY): Payer: Self-pay

## 2023-01-22 ENCOUNTER — Other Ambulatory Visit (HOSPITAL_COMMUNITY): Payer: Self-pay

## 2023-01-23 ENCOUNTER — Other Ambulatory Visit: Payer: Self-pay

## 2023-01-27 ENCOUNTER — Other Ambulatory Visit: Payer: Self-pay | Admitting: Adult Health

## 2023-02-06 ENCOUNTER — Other Ambulatory Visit (HOSPITAL_COMMUNITY): Payer: Self-pay

## 2023-02-06 DIAGNOSIS — Z79899 Other long term (current) drug therapy: Secondary | ICD-10-CM | POA: Diagnosis not present

## 2023-02-06 MED ORDER — LISDEXAMFETAMINE DIMESYLATE 70 MG PO CAPS
70.0000 mg | ORAL_CAPSULE | Freq: Every morning | ORAL | 0 refills | Status: DC
  Filled 2023-02-06: qty 30, 30d supply, fill #0

## 2023-02-10 DIAGNOSIS — Z79899 Other long term (current) drug therapy: Secondary | ICD-10-CM | POA: Diagnosis not present

## 2023-02-10 DIAGNOSIS — R5383 Other fatigue: Secondary | ICD-10-CM | POA: Diagnosis not present

## 2023-02-10 DIAGNOSIS — M2550 Pain in unspecified joint: Secondary | ICD-10-CM | POA: Diagnosis not present

## 2023-02-10 DIAGNOSIS — M0609 Rheumatoid arthritis without rheumatoid factor, multiple sites: Secondary | ICD-10-CM | POA: Diagnosis not present

## 2023-02-18 ENCOUNTER — Other Ambulatory Visit (HOSPITAL_COMMUNITY): Payer: Self-pay

## 2023-02-20 ENCOUNTER — Other Ambulatory Visit (HOSPITAL_COMMUNITY): Payer: Self-pay

## 2023-02-20 ENCOUNTER — Encounter: Payer: 59 | Admitting: Physical Medicine and Rehabilitation

## 2023-02-23 ENCOUNTER — Other Ambulatory Visit (HOSPITAL_COMMUNITY): Payer: Self-pay

## 2023-02-24 ENCOUNTER — Telehealth: Payer: Self-pay

## 2023-02-24 ENCOUNTER — Other Ambulatory Visit (HOSPITAL_COMMUNITY): Payer: Self-pay

## 2023-02-24 NOTE — Telephone Encounter (Signed)
Received a PA request for Nurtec-looks like per chart notes the PT has not been seen since 2022 but has an upcoming appointment-Should I complete the PA or would you prefer to wait until after the PT comes in for her visit? Please advise.

## 2023-02-24 NOTE — Telephone Encounter (Signed)
We can wait to see if the pt shows to her appt.

## 2023-03-03 ENCOUNTER — Other Ambulatory Visit (HOSPITAL_BASED_OUTPATIENT_CLINIC_OR_DEPARTMENT_OTHER): Payer: Self-pay

## 2023-03-03 ENCOUNTER — Other Ambulatory Visit: Payer: Self-pay

## 2023-03-03 MED ORDER — PREDNISONE 1 MG PO TABS
4.0000 mg | ORAL_TABLET | Freq: Every day | ORAL | 0 refills | Status: DC
  Filled 2023-03-03 – 2023-03-07 (×2): qty 360, 90d supply, fill #0

## 2023-03-05 ENCOUNTER — Other Ambulatory Visit (HOSPITAL_BASED_OUTPATIENT_CLINIC_OR_DEPARTMENT_OTHER): Payer: Self-pay

## 2023-03-05 DIAGNOSIS — R200 Anesthesia of skin: Secondary | ICD-10-CM | POA: Diagnosis not present

## 2023-03-05 DIAGNOSIS — R202 Paresthesia of skin: Secondary | ICD-10-CM | POA: Diagnosis not present

## 2023-03-05 DIAGNOSIS — M79642 Pain in left hand: Secondary | ICD-10-CM | POA: Diagnosis not present

## 2023-03-07 ENCOUNTER — Other Ambulatory Visit (HOSPITAL_COMMUNITY): Payer: Self-pay

## 2023-03-07 ENCOUNTER — Other Ambulatory Visit (HOSPITAL_BASED_OUTPATIENT_CLINIC_OR_DEPARTMENT_OTHER): Payer: Self-pay

## 2023-03-07 MED ORDER — GABAPENTIN 300 MG PO CAPS
300.0000 mg | ORAL_CAPSULE | Freq: Three times a day (TID) | ORAL | 1 refills | Status: DC
  Filled 2023-03-07: qty 90, 30d supply, fill #0

## 2023-03-09 ENCOUNTER — Other Ambulatory Visit (HOSPITAL_BASED_OUTPATIENT_CLINIC_OR_DEPARTMENT_OTHER): Payer: Self-pay

## 2023-03-11 ENCOUNTER — Other Ambulatory Visit (HOSPITAL_BASED_OUTPATIENT_CLINIC_OR_DEPARTMENT_OTHER): Payer: Self-pay

## 2023-03-11 MED ORDER — LISDEXAMFETAMINE DIMESYLATE 70 MG PO CAPS
70.0000 mg | ORAL_CAPSULE | Freq: Every morning | ORAL | 0 refills | Status: DC
  Filled 2023-03-11: qty 90, 90d supply, fill #0

## 2023-03-16 NOTE — Progress Notes (Unsigned)
[facility] [address] [place]. [place] [address] [phone]       OFFICE FOLLOW UP VISIT NOTE  [name] Date of Birth:  08-15-1971 Medical Record Number:  [id number]    Primary neurologist: Dr. Pearlean Brownie Referring MD: Eliezer Champagne, [name] Reason for Referral: Stroke like episode  Chief Complaint  Patient presents with   Follow-up    Patient in room #8 and alone. Patient states she here to f/u with her migraines. Patient states she is well and stable, no new concerns.     HPI:   Initial visit 10/03/2020  :Natalie Ford is a 52 year old Caucasian lady seen today for initial office consultation visit for strokelike episode.  History is obtained from the patient, review of electronic medical records and I personally reviewed available imaging films in PACS.  She has past medical history of hyperlipidemia, dyspepsia, irritable bowel syndrome, COVID-19 infection who presented on 08/05/2020 with sudden onset of right-sided weakness numbness right upper quadrant vision difficulties and spinning sensation.  His symptoms initially improved but she continued to have focal deficits since she was given IV TPA after discussion of risk benefit.  Her symptoms resolved after that.  She was kept in the neurological intensive care unit and blood pressure tightly controlled.  Subsequently in the CT angiogram and head and neck were obtained prior to TPA which were unremarkable.  Subsequently MRI scan of the brain was obtained which was normal.  2D echo showed ejection fraction of 55 to 60% without cardiac source of embolism.  LDL cholesterol was elevated at 1 1 7  mg percent and hemoglobin A1c was 4.8.  She is started on aspirin and diet controlled for lipids.  Patient states that she has done well since discharge she has had no recurrent episodes.  She has had a lot of muscle aches and pains and has been diagnosed with rheumatoid arthritis and is planning on starting Enbrel  soon.  She continues to have some blurred vision and has seen an ophthalmologist was prescribed Eysuvis eyedrops which she is using.  She also feels easy fatigability and feels a lot of brain fog.  She has no significant vascular risk factors except hyperlipidemia.  She is tolerating aspirin well without bruising or bleeding.  She has no new complaints.  She denies any prior history of migraine headaches similar episodes in the past.  Update 05/14/21 : She returns for follow-up after last visit 7 months ago.  Patient was seen in the ER on 12/28/2020 with headaches and fleeting left-sided paresthesias.  CT head was negative for acute abnormality.  She was given headache cocktail and symptoms resolved.  Patient was diagnosed with COVID infection was the third time on September 6 and is recovering from that.  She continues to have migraine headaches which occur about 3-4 times a month.  She has been started on Inderal 30 mg twice daily for tachycardia which she has had post-COVID.  Her heart rate is still elevated at 95 today.  She is also been diagnosed with rheumatoid arthritis and started on Orencia monthly injections.  She complains of occasional pins-and-needles in the feet at night but these are not bothersome or frequent.  Patient does not want any work-up for peripheral neuropathy at this time.  She denies any difficulty with balance or gait weakness.  She remains on aspirin which is tolerating well without bruising or bleeding and Crestor which is tolerating without muscle aches and pains.  Update 08/13/2021 JM: Returns for follow-up  visit after prior visit 3 months ago with Dr. Pearlean Brownie for prior strokelike episode possibly complicated migraine.  During the interval time, propranolol further increased to 60 mg twice daily with some improvement. Use of Fioricet for emergent relief but unable to tolerate due to increased fatigue after taking.  Currently experiencing almost daily dull tension type headaches in  approximately 2-3 more severe headaches per month associated with phonophobia.  Denies photophobia or N/V.  Daily use of Tylenol in the morning as she will frequently awaken with headache.  Prior sleep study 10/2020 negative for sleep apnea. She believes headaches have been worse since COVID back in September. She also gets Orencia infusions for RA with bad headache after.  Denies new or reoccurring strokelike symptoms.  Compliant on aspirin and Crestor without side effects.  Blood pressure today 115/77.  TCD unremarkable.  No further concerns at this time    Update 03/17/2023 JM: reports she has been doing well since prior visit.  She no longer is experiencing daily tension type headaches.  She does experience migraine headache twice per month, use of Nurtec with resolution. Can worsen some months based on stress and weather changes.  Continues on propranolol 40 mg 3 times daily for preventative, occasionally will miss evening dose. No new neurological or stroke/TIA symptoms. Currently being followed by orthopedics for left hand numbness, gradually worsening, has EMG/NCV scheduled tomorrow and possible decompression based on results. Continues to follow with rheumatology for rheumatoid arthritis, has been gradually decreasing prednisone dosage.       ROS:   14 system review of systems is positive for those listed in HPI and pains and all other systems negative  PMH:  Past Medical History:  Diagnosis Date   Allergic rhinitis    COVID-19    x 3   Dyspepsia    Hypercholesteremia    IBS (irritable bowel syndrome)    Lumbosacral strain    RA (rheumatoid arthritis) (HCC)    Stroke (HCC) 08/05/2020   tia    Social History:  Social History   Socioeconomic History   Marital status: Married    Spouse name: Not on file   Number of children: 2   Years of education: Not on file   Highest education level: Not on file  Occupational History   Occupation: Charity fundraiser, Psychologist, forensic: [employer]   Occupation: Charity fundraiser, Scientist, research (physical sciences)    Comment: [employer]  Tobacco Use   Smoking status: Never   Smokeless tobacco: Never   Tobacco comments:    exposed to second hand smoke  Vaping Use   Vaping status: Never Used  Substance and Sexual Activity   Alcohol use: Yes    Comment: social use   Drug use: No   Sexual activity: Not Currently  Other Topics Concern   Not on file  Social History Narrative   Lives with 2 children and husband   Right Handed   Drinks 1-2 cups caffeine daily   Social Determinants of Health   Financial Resource Strain: Not on file  Food Insecurity: Not on file  Transportation Needs: Not on file  Physical Activity: Not on file  Stress: Not on file  Social Connections: Unknown (01/07/2022)   Received [facility]   Social Network    Social Network: Not on file  Intimate Partner Violence: Unknown (11/28/2021)   Received from [facility]   HITS    Physically Hurt: Not on file    Insult or Talk  Down To: Not on file    Threaten Physical Harm: Not on file    Scream or Curse: Not on file    Medications:   Current Outpatient Medications on File Prior to Visit  Medication Sig Dispense Refill   albuterol (VENTOLIN HFA) 108 (90 Base) MCG/ACT inhaler Inhale 1-2 puffs into the lungs every 6 (six) hours as needed for wheezing or shortness of breath. 18 g 5   aspirin EC 81 MG EC tablet Take 1 tablet (81 mg total) by mouth daily. Swallow whole. 30 tablet 11   buPROPion (WELLBUTRIN XL) 150 MG 24 hr tablet TAKE THREE TABLETS BY MOUTH EVERY MORNING 270 tablet 3   cycloSPORINE, PF, (CEQUA) 0.09 % SOLN Place 1 drop into both eyes 2 times a day 180 each 3   fluticasone (FLONASE) 50 MCG/ACT nasal spray Place 2 sprays into both nostrils daily.     fluticasone-salmeterol (ADVAIR HFA) 230-21 MCG/ACT inhaler Inhale 2 puffs into the lungs 2 (two) times daily. 1 Inhaler 12   gabapentin (NEURONTIN) 400 MG capsule      Multiple Vitamin (MULTIVITAMIN) capsule  Take 1 capsule by mouth at bedtime.     NURTEC 75 MG TBDP PLACE ONE TABLET BY MOUTH DAILY AS NEEDED 8 tablet 2   Omega-3 Fatty Acids (FISH OIL) 1000 MG CAPS Take 2 capsules by mouth daily.     predniSONE (DELTASONE) 1 MG tablet TAKE 4 TABLETS BY MOUTH EVERY MORNING WITH BREAKFAST 100 tablet 3   propranolol (INDERAL) 40 MG tablet TAKE ONE TABLET BY MOUTH THREE TIMES A DAY 270 tablet 1   rosuvastatin (CRESTOR) 10 MG tablet Take 10 mg by mouth daily.     Upadacitinib ER (RINVOQ) 15 MG TB24 1 tablet Orally Once a day 30 days 30 tablet 1   lisdexamfetamine (VYVANSE) 70 MG capsule Take 1 capsule (70 mg total) by mouth in the morning. (Patient not taking: Reported on 03/17/2023) 90 capsule 0   predniSONE (DELTASONE) 1 MG tablet Take 4 tablets (4 mg total) by mouth daily. (Patient not taking: Reported on 03/17/2023) 360 tablet 0   Current Facility-Administered Medications on File Prior to Visit  Medication Dose Route Frequency Provider Last Rate Last Admin   lidocaine (XYLOCAINE) 1 % (with pres) injection 3 mL  3 mL Other Once Raulkar, Drema Pry, MD       sodium chloride (PF) 0.9 % injection 2 mL  2 mL Intravenous PRN Raulkar, Drema Pry, MD        Allergies:   Allergies  Allergen Reactions   Penicillins Rash    rash      Physical Exam Today's Vitals   03/17/23 1455  BP: 111/68  Pulse: 67  Weight: 128 lb (58.1 kg)  Height: 5' (1.524 m)    Body mass index is 25 kg/m.   General: Pleasant well developed, well nourished very pleasant middle-aged Caucasian lady, seated, in no evident distress Head: head normocephalic and atraumatic.   Neck: supple with no carotid or supraclavicular bruits Cardiovascular: regular rate and rhythm, no murmurs Musculoskeletal: no deformity Skin:  no rash/petichiae Vascular:  Normal pulses all extremities  Neurologic Exam Mental Status: Awake and fully alert. Oriented to place and time. Recent and remote memory intact. Attention span, concentration and fund  of knowledge appropriate. Mood and affect appropriate.  Cranial Nerves: Pupils equal, briskly reactive to light. Extraocular movements full without nystagmus. Visual fields full to confrontation. Hearing intact. Facial sensation intact. Face, tongue, palate moves normally and symmetrically.  Motor: Normal bulk and tone. Normal strength in all tested extremity muscles. Coordination: Rapid alternating movements normal in all extremities. Finger-to-nose and heel-to-shin performed accurately bilaterally. Gait and Station: Arises from chair without difficulty. Stance is normal. Gait demonstrates normal stride length and balance . Able to heel, toe and tandem walk without difficulty.  Reflexes: 1+ and symmetric. Toes downgoing.       ASSESSMENT/PLAN: 52 year old Caucasian lady with transient episode of right-sided weakness numbness, spinning sensation and vision deficits nightly stroke like episode possibly complicated migraine treated with IV TPA with full recovery.  Brain imaging negative for acute stroke.  Vascular risk factors of hyperlipidemia only.  Episode of headache with fleeting left-sided paresthesias possibly complicated migraine episode in May 2022. No longer experiencing daily tension type headaches, reports 2 migraine headache days per month.     -Continue propranolol 40 mg 3 times daily for preventative -Continue Nurtec 75 mg daily as needed -refills provided -Would not recommend triptan due to strokelike episode s/p tPA -Hesitant to trial antidepressant such as tricyclic due to current use of high-dose bupropion -Continue aspirin 81 mg daily and Crestor 10 mg daily for secondary stroke prevention measures -Continue to follow with PCP for aggressive stroke risk factor management   Follow-up in 1 year or call earlier if needed     CC:  Merri Brunette, MD   I spent 31 minutes of face-to-face and non-face-to-face time with patient.  This included previsit chart review, lab  review, study review, order entry, electronic health record documentation, patient education and discussion regarding above diagnoses and treatment plan and answered all other questions to patient satisfaction  Ihor Austin, [facility]  [facility] [address] [place], [place] [address]  Phone [phone] Fax [phone] Note: This document was prepared with digital dictation and possible smart phrase technology. Any transcriptional errors that result from this process are unintentional.

## 2023-03-17 ENCOUNTER — Ambulatory Visit: Payer: 59 | Admitting: Adult Health

## 2023-03-17 ENCOUNTER — Encounter: Payer: Self-pay | Admitting: Adult Health

## 2023-03-17 VITALS — BP 111/68 | HR 67 | Ht 60.0 in | Wt 128.0 lb

## 2023-03-17 DIAGNOSIS — G43009 Migraine without aura, not intractable, without status migrainosus: Secondary | ICD-10-CM | POA: Diagnosis not present

## 2023-03-17 DIAGNOSIS — R2990 Unspecified symptoms and signs involving the nervous system: Secondary | ICD-10-CM

## 2023-03-17 MED ORDER — NURTEC 75 MG PO TBDP: 75.0000 mg | ORAL_TABLET | Freq: Every day | ORAL | 2 refills | Status: DC | PRN

## 2023-03-17 NOTE — Patient Instructions (Addendum)
Continue propranolol 40mg  three times daily for headache prevention   Continue Nurtec as needed for severe headaches/migraines     Followup in the future with me in 1 year or call earlier if needed      Thank you for coming to see [place] at [facility]. I hope we have been able to provide you high quality care today.  You may receive a patient satisfaction survey over the next few weeks. We would appreciate your feedback and comments so that we may continue to improve ourselves and the health of our patients.

## 2023-03-19 DIAGNOSIS — G5602 Carpal tunnel syndrome, left upper limb: Secondary | ICD-10-CM | POA: Diagnosis not present

## 2023-03-24 ENCOUNTER — Telehealth: Payer: Self-pay

## 2023-03-24 ENCOUNTER — Other Ambulatory Visit (HOSPITAL_COMMUNITY): Payer: Self-pay

## 2023-03-24 NOTE — Telephone Encounter (Signed)
Pharmacy Patient Advocate Encounter   Received notification from CoverMyMeds that prior authorization for Nurtec 75MG  dispersible tablets is required/requested.   Insurance verification completed.   The patient is insured through [facility] .   Per test claim: PA required; PA submitted to [facility] via CoverMyMeds Key/confirmation #/EOC [id number]  Status is pending

## 2023-03-24 NOTE — Telephone Encounter (Signed)
Pharmacy Patient Advocate Encounter  Received notification from [facility] that Prior Authorization for Nurtec 75MG  dispersible tablets has been APPROVED from 03/24/2023 to 03/23/2024. Ran test claim, Copay is $0 per 30DS/8 Tablets  PA #/Case ID/Reference #: PA Case ID #: [id number]

## 2023-03-27 ENCOUNTER — Encounter: Payer: 59 | Admitting: Physical Medicine and Rehabilitation

## 2023-04-23 ENCOUNTER — Other Ambulatory Visit: Payer: Self-pay | Admitting: Obstetrics and Gynecology

## 2023-04-23 DIAGNOSIS — R9230 Dense breasts, unspecified: Secondary | ICD-10-CM

## 2023-04-29 ENCOUNTER — Other Ambulatory Visit (HOSPITAL_COMMUNITY): Payer: Self-pay

## 2023-04-29 ENCOUNTER — Other Ambulatory Visit: Payer: Self-pay | Admitting: Internal Medicine

## 2023-05-01 ENCOUNTER — Other Ambulatory Visit (HOSPITAL_BASED_OUTPATIENT_CLINIC_OR_DEPARTMENT_OTHER): Payer: Self-pay

## 2023-05-01 ENCOUNTER — Other Ambulatory Visit (HOSPITAL_COMMUNITY): Payer: Self-pay

## 2023-05-01 MED ORDER — RINVOQ 15 MG PO TB24: 15.0000 mg | ORAL_TABLET | Freq: Every day | ORAL | 1 refills | Status: DC

## 2023-05-02 ENCOUNTER — Other Ambulatory Visit (HOSPITAL_COMMUNITY): Payer: Self-pay

## 2023-05-04 ENCOUNTER — Other Ambulatory Visit: Payer: Self-pay

## 2023-05-04 ENCOUNTER — Other Ambulatory Visit (HOSPITAL_COMMUNITY): Payer: Self-pay

## 2023-05-04 ENCOUNTER — Other Ambulatory Visit: Payer: Self-pay | Admitting: Pharmacist

## 2023-05-04 MED ORDER — RINVOQ 15 MG PO TB24
15.0000 mg | ORAL_TABLET | Freq: Every day | ORAL | 1 refills | Status: DC
  Filled 2023-05-04 (×2): qty 30, 30d supply, fill #0
  Filled 2023-05-19: qty 30, 30d supply, fill #1

## 2023-05-05 ENCOUNTER — Other Ambulatory Visit: Payer: Self-pay

## 2023-05-05 ENCOUNTER — Other Ambulatory Visit (HOSPITAL_COMMUNITY): Payer: Self-pay

## 2023-05-05 MED ORDER — RINVOQ 15 MG PO TB24
ORAL_TABLET | ORAL | 1 refills | Status: DC
  Filled 2023-06-29: qty 30, 30d supply, fill #0

## 2023-05-07 ENCOUNTER — Other Ambulatory Visit (HOSPITAL_COMMUNITY): Payer: Self-pay

## 2023-05-08 ENCOUNTER — Other Ambulatory Visit (HOSPITAL_COMMUNITY): Payer: Self-pay

## 2023-05-13 DIAGNOSIS — M2550 Pain in unspecified joint: Secondary | ICD-10-CM | POA: Diagnosis not present

## 2023-05-13 DIAGNOSIS — M791 Myalgia, unspecified site: Secondary | ICD-10-CM | POA: Diagnosis not present

## 2023-05-13 DIAGNOSIS — M25561 Pain in right knee: Secondary | ICD-10-CM | POA: Diagnosis not present

## 2023-05-13 DIAGNOSIS — Z79899 Other long term (current) drug therapy: Secondary | ICD-10-CM | POA: Diagnosis not present

## 2023-05-13 DIAGNOSIS — M0609 Rheumatoid arthritis without rheumatoid factor, multiple sites: Secondary | ICD-10-CM | POA: Diagnosis not present

## 2023-05-13 DIAGNOSIS — M25562 Pain in left knee: Secondary | ICD-10-CM | POA: Diagnosis not present

## 2023-05-16 ENCOUNTER — Encounter (HOSPITAL_COMMUNITY): Payer: Self-pay

## 2023-05-16 ENCOUNTER — Other Ambulatory Visit: Payer: Self-pay

## 2023-05-19 ENCOUNTER — Other Ambulatory Visit (HOSPITAL_COMMUNITY): Payer: Self-pay

## 2023-05-19 ENCOUNTER — Other Ambulatory Visit: Payer: Self-pay

## 2023-05-19 NOTE — Progress Notes (Signed)
Specialty Pharmacy Refill Coordination Note  Natalie Ford is a 52 y.o. female contacted today regarding refills of specialty medication(s) Upadacitinib .  Patient requested [name] at [facility] [place]  on 06/02/23   Medication will be filled on 06/01/2023.

## 2023-05-27 DIAGNOSIS — G5602 Carpal tunnel syndrome, left upper limb: Secondary | ICD-10-CM | POA: Diagnosis not present

## 2023-06-01 DIAGNOSIS — R7989 Other specified abnormal findings of blood chemistry: Secondary | ICD-10-CM | POA: Diagnosis not present

## 2023-06-01 DIAGNOSIS — E7800 Pure hypercholesterolemia, unspecified: Secondary | ICD-10-CM | POA: Diagnosis not present

## 2023-06-01 DIAGNOSIS — R739 Hyperglycemia, unspecified: Secondary | ICD-10-CM | POA: Diagnosis not present

## 2023-06-01 DIAGNOSIS — Z0000 Encounter for general adult medical examination without abnormal findings: Secondary | ICD-10-CM | POA: Diagnosis not present

## 2023-06-02 LAB — LAB REPORT - SCANNED
A1c: 5.3
EGFR: 65

## 2023-06-08 ENCOUNTER — Other Ambulatory Visit: Payer: Self-pay

## 2023-06-09 ENCOUNTER — Other Ambulatory Visit (HOSPITAL_COMMUNITY): Payer: Self-pay

## 2023-06-10 ENCOUNTER — Other Ambulatory Visit: Payer: Self-pay | Admitting: Nurse Practitioner

## 2023-06-10 DIAGNOSIS — R5382 Chronic fatigue, unspecified: Secondary | ICD-10-CM | POA: Diagnosis not present

## 2023-06-10 DIAGNOSIS — E041 Nontoxic single thyroid nodule: Secondary | ICD-10-CM

## 2023-06-11 DIAGNOSIS — G5602 Carpal tunnel syndrome, left upper limb: Secondary | ICD-10-CM | POA: Diagnosis not present

## 2023-06-12 ENCOUNTER — Ambulatory Visit: Payer: 59 | Attending: Internal Medicine | Admitting: Pharmacist

## 2023-06-12 DIAGNOSIS — Z79899 Other long term (current) drug therapy: Secondary | ICD-10-CM

## 2023-06-12 NOTE — Progress Notes (Signed)
  S: Patient presents today for review of their specialty medication.   Patient is currently taking Rinvoq for rheumatoid arthritis. Patient is managed by Dr. Deanne Coffer for this.   Dosing: Adult  Note: May be used as monotherapy or in combination with methotrexate or other nonbiologic disease-modifying antirheumatic drugs (DMARDs); use in combination with biologic DMARDS or potent immunosuppressants (eg, azathioprine, cyclosporine) is not recommended. Do not initiate therapy in patients with an absolute lymphocyte count <500/mm3, ANC <1,000/mm3, or hemoglobin <8 g/dL. Rheumatoid arthritis: Oral: 15 mg once daily.  Adherence: confirms.   Efficacy: pt feels that the medication is working well for her.   Monitoring: S/sx thromboembolism: none S/sx malignancy: none  S/sx of infection: none  Current adverse effects: none   O:     Lab Results  Component Value Date   WBC 8.1 08/13/2022   HGB 14.9 08/13/2022   HCT 44.1 08/13/2022   MCV 100 (H) 08/13/2022   PLT 317 08/13/2022      Chemistry      Component Value Date/Time   NA 139 08/13/2022 1139   K 4.5 08/13/2022 1139   CL 103 08/13/2022 1139   CO2 22 08/13/2022 1139   BUN 23 08/13/2022 1139   CREATININE 0.84 08/13/2022 1139   CREATININE 0.92 05/30/2021 1414   CREATININE 0.87 11/15/2019 1546      Component Value Date/Time   CALCIUM 9.7 08/13/2022 1139   ALKPHOS 59 08/13/2022 1139   AST 24 08/13/2022 1139   AST 16 05/30/2021 1414   ALT 27 08/13/2022 1139   ALT 18 05/30/2021 1414   BILITOT 0.3 08/13/2022 1139   BILITOT 0.4 05/30/2021 1414       Lab Results  Component Value Date   CHOL 184 08/13/2022   HDL 97 08/13/2022   LDLCALC 64 08/13/2022   LDLDIRECT 123.2 07/13/2007   TRIG 142 08/13/2022   CHOLHDL 1.9 08/13/2022     A/P: 1. Medication review: patient currently taking Rinvoq for rheumatoid arthritis. Reviewed the medication with the patient, including the following: Rinvoq is a medication used to treat  rheumatoid arthritis. Administer with or without food. Swallow tablet whole; do not crush, split, or chew. Possible adverse effects include increased risk of infection, GI upset, hematologic toxicity, hepatic effects, lipid abnormalities, increased risk of malignancy, thromboembolism. Avoid live vaccinations. No recommendations for any changes.  Butch Penny, PharmD, Patsy [place], CPP Clinical Pharmacist [facility] [phone]

## 2023-06-15 ENCOUNTER — Ambulatory Visit
Admission: RE | Admit: 2023-06-15 | Discharge: 2023-06-15 | Disposition: A | Discharge status: Home / Self Care | Payer: 59 | Source: Ambulatory Visit | Attending: Nurse Practitioner | Admitting: Nurse Practitioner

## 2023-06-15 DIAGNOSIS — E041 Nontoxic single thyroid nodule: Secondary | ICD-10-CM | POA: Diagnosis not present

## 2023-06-16 ENCOUNTER — Other Ambulatory Visit (HOSPITAL_COMMUNITY): Payer: Self-pay

## 2023-06-16 ENCOUNTER — Other Ambulatory Visit: Payer: Self-pay

## 2023-06-16 MED ORDER — LISDEXAMFETAMINE DIMESYLATE 70 MG PO CAPS
70.0000 mg | ORAL_CAPSULE | Freq: Every morning | ORAL | 0 refills | Status: DC
  Filled 2023-06-16: qty 90, 90d supply, fill #0

## 2023-06-17 ENCOUNTER — Ambulatory Visit
Admission: RE | Admit: 2023-06-17 | Discharge: 2023-06-17 | Disposition: A | Discharge status: Home / Self Care | Payer: No Typology Code available for payment source | Source: Ambulatory Visit | Attending: Obstetrics and Gynecology | Admitting: Obstetrics and Gynecology

## 2023-06-17 ENCOUNTER — Other Ambulatory Visit (HOSPITAL_COMMUNITY): Payer: Self-pay

## 2023-06-17 DIAGNOSIS — M0609 Rheumatoid arthritis without rheumatoid factor, multiple sites: Secondary | ICD-10-CM | POA: Diagnosis not present

## 2023-06-17 DIAGNOSIS — E7800 Pure hypercholesterolemia, unspecified: Secondary | ICD-10-CM | POA: Diagnosis not present

## 2023-06-17 DIAGNOSIS — Z0000 Encounter for general adult medical examination without abnormal findings: Secondary | ICD-10-CM | POA: Diagnosis not present

## 2023-06-17 DIAGNOSIS — R9230 Dense breasts, unspecified: Secondary | ICD-10-CM

## 2023-06-17 MED ORDER — GADOPICLENOL 0.5 MMOL/ML IV SOLN
6.0000 mL | Freq: Once | INTRAVENOUS | Status: AC | PRN
  Administered 2023-06-17: 6 mL via INTRAVENOUS

## 2023-06-23 ENCOUNTER — Other Ambulatory Visit (HOSPITAL_COMMUNITY): Payer: Self-pay

## 2023-06-29 ENCOUNTER — Encounter (HOSPITAL_COMMUNITY): Payer: Self-pay

## 2023-06-29 ENCOUNTER — Other Ambulatory Visit: Payer: Self-pay

## 2023-06-29 NOTE — Progress Notes (Signed)
Specialty Pharmacy Refill Coordination Note  Natalie Ford is a 52 y.o. female contacted today regarding refills of specialty medication(s) Upadacitinib   Patient requested Delivery   Delivery date: 07/02/23   Verified address: [address]   Medication will be filled on 07/01/23.

## 2023-06-29 NOTE — Progress Notes (Signed)
Specialty Pharmacy Ongoing Clinical Assessment Note  Natalie Ford is a 52 y.o. female who is being followed by the specialty pharmacy service for RxSp Rheumatoid Arthritis   Patient's specialty medication(s) reviewed today: Upadacitinib   Missed doses in the last 4 weeks: 0   Patient/Caregiver did not have any additional questions or concerns.   Therapeutic benefit summary: Patient is achieving benefit   Adverse events/side effects summary: No adverse events/side effects   Patient's therapy is appropriate to: Continue    Goals Addressed             This Visit's Progress    Reduce inflammation       Patient is on track. Patient will maintain adherence         Follow up:  6 months  Bobette Mo Specialty Pharmacist

## 2023-06-30 ENCOUNTER — Other Ambulatory Visit (HOSPITAL_COMMUNITY): Payer: Self-pay

## 2023-07-11 ENCOUNTER — Other Ambulatory Visit: Payer: Self-pay | Admitting: Psychiatry

## 2023-07-12 NOTE — Telephone Encounter (Signed)
Can you send please

## 2023-07-13 ENCOUNTER — Other Ambulatory Visit (HOSPITAL_COMMUNITY): Payer: Self-pay

## 2023-07-14 ENCOUNTER — Other Ambulatory Visit (HOSPITAL_COMMUNITY): Payer: Self-pay

## 2023-07-14 ENCOUNTER — Other Ambulatory Visit: Payer: Self-pay | Admitting: Pharmacist

## 2023-07-14 MED ORDER — RINVOQ 15 MG PO TB24
ORAL_TABLET | ORAL | 1 refills | Status: DC
  Filled 2023-07-14: qty 30, fill #0
  Filled 2023-08-24: qty 30, 30d supply, fill #0
  Filled 2023-09-17: qty 30, 30d supply, fill #1

## 2023-07-16 ENCOUNTER — Other Ambulatory Visit: Payer: Self-pay

## 2023-07-17 ENCOUNTER — Other Ambulatory Visit: Payer: Self-pay

## 2023-07-18 DIAGNOSIS — Z1212 Encounter for screening for malignant neoplasm of rectum: Secondary | ICD-10-CM | POA: Diagnosis not present

## 2023-07-18 DIAGNOSIS — Z1211 Encounter for screening for malignant neoplasm of colon: Secondary | ICD-10-CM | POA: Diagnosis not present

## 2023-07-22 ENCOUNTER — Other Ambulatory Visit: Payer: Self-pay

## 2023-07-30 ENCOUNTER — Other Ambulatory Visit: Payer: Self-pay

## 2023-08-03 ENCOUNTER — Other Ambulatory Visit: Payer: Self-pay | Admitting: Psychiatry

## 2023-08-04 ENCOUNTER — Telehealth: Payer: 59 | Admitting: Physician Assistant

## 2023-08-04 DIAGNOSIS — H66001 Acute suppurative otitis media without spontaneous rupture of ear drum, right ear: Secondary | ICD-10-CM | POA: Diagnosis not present

## 2023-08-05 MED ORDER — AZITHROMYCIN 250 MG PO TABS: ORAL_TABLET | ORAL | 0 refills | Status: AC

## 2023-08-05 NOTE — Progress Notes (Signed)
I have spent 5 minutes in review of e-visit questionnaire, review and updating patient chart, medical decision making and response to patient.   Mia Milan Cody Jacklynn Dehaas, [name]    

## 2023-08-05 NOTE — Progress Notes (Signed)
E-Visit for Ear Pain - Acute Otitis Media   We are sorry that you are not feeling well. Here is how we plan to help!  Based on what you have shared with me it looks like you have Acute Otitis Media.  Acute Otitis Media is an infection of the middle or "inner" ear. This type of infection can cause redness, inflammation, and fluid buildup behind the tympanic membrane (ear drum).  The usual symptoms include: Earache/Pain Fever Upper respiratory symptoms Lack of energy/Fatigue/Malaise Slight hearing loss gradually worsening- if the inner ear fills with fluid What causes middle ear infections? Most middle ear infections occur when an infection such as a cold, leads to a build-up of mucus in the middle ear and causes the Eustachian tube (a thin tube that runs from the middle ear to the back of the nose) to become swollen or blocked.   This means mucus can't drain away properly, making it easier for an infection to spread into the middle ear.  How middle ear infections are treated: Most ear infections clear up within three to five days and don't need any specific treatment. If necessary, tylenol or ibuprofen should be used to relieve pain and a high temperature.  If you develop a fever higher than 102, or any significantly worsening symptoms, this could indicate a more serious infection moving to the middle/inner and needs face to face evaluation in an office by a provider.   Antibiotics aren't routinely used to treat middle ear infections, although they may occasionally be prescribed if symptoms persist or are particularly severe. Given your presentation,   I have prescribed Azithromycin 250 mg two tablets by mouth on day 1, then 1 tablet by mouth daily until completed    Your symptoms should improve over the next 3 days and should resolve in about 7 days. Be sure to complete ALL of the prescription(s) given.  HOME CARE: Wash your hands frequently. If you are prescribed an ear drop, do not  place the tip of the bottle on your ear or touch it with your fingers. You can take Acetaminophen 650 mg every 4-6 hours as needed for pain.  If pain is severe or moderate, you can apply a heating pad (set on low) or hot water bottle (wrapped in a towel) to outer ear for 20 minutes.  This will also increase drainage.  GET HELP RIGHT AWAY IF: Fever is over 102.2 degrees. You develop progressive ear pain or hearing loss. Ear symptoms persist longer than 3 days after treatment.  MAKE SURE YOU: Understand these instructions. Will watch your condition. Will get help right away if you are not doing well or get worse.  Thank you for choosing an e-visit.  Your e-visit answers were reviewed by a board certified advanced clinical practitioner to complete your personal care plan. Depending upon the condition, your plan could have included both over the counter or prescription medications.  Please review your pharmacy choice. Make sure the pharmacy is open so you can pick up the prescription now. If there is a problem, you may contact your provider through MyChart messaging and have the prescription routed to another pharmacy.  Your safety is important to us. If you have drug allergies check your prescription carefully.   For the next 24 hours you can use MyChart to ask questions about today's visit, request a non-urgent call back, or ask for a work or school excuse. You will get an email with a survey after your eVisit asking about   your experience. We would appreciate your feedback. I hope that your e-visit has been valuable and will aid in your recovery.   

## 2023-08-06 ENCOUNTER — Other Ambulatory Visit (HOSPITAL_COMMUNITY): Payer: Self-pay

## 2023-08-12 DIAGNOSIS — M2550 Pain in unspecified joint: Secondary | ICD-10-CM | POA: Diagnosis not present

## 2023-08-12 DIAGNOSIS — M791 Myalgia, unspecified site: Secondary | ICD-10-CM | POA: Diagnosis not present

## 2023-08-12 DIAGNOSIS — M0609 Rheumatoid arthritis without rheumatoid factor, multiple sites: Secondary | ICD-10-CM | POA: Diagnosis not present

## 2023-08-12 DIAGNOSIS — Z79899 Other long term (current) drug therapy: Secondary | ICD-10-CM | POA: Diagnosis not present

## 2023-08-12 DIAGNOSIS — M549 Dorsalgia, unspecified: Secondary | ICD-10-CM | POA: Diagnosis not present

## 2023-08-24 ENCOUNTER — Other Ambulatory Visit: Payer: Self-pay

## 2023-08-24 NOTE — Progress Notes (Signed)
Specialty Pharmacy Refill Coordination Note  Natalie Ford is a 52 y.o. female contacted today regarding refills of specialty medication(s) Upadacitinib (Rinvoq)   Patient requested Delivery   Delivery date: 08/25/23   Verified address: [address]   Medication will be filled on 12.30.24.

## 2023-09-17 ENCOUNTER — Other Ambulatory Visit: Payer: Self-pay

## 2023-09-17 NOTE — Progress Notes (Signed)
Specialty Pharmacy Refill Coordination Note  Natalie Ford is a 53 y.o. female contacted today regarding refills of specialty medication(s) Upadacitinib (Rinvoq)   Patient requested Delivery   Delivery date: 09/22/23   Verified address: [address]   Medication will be filled on 01.27.25.

## 2023-09-18 ENCOUNTER — Other Ambulatory Visit (HOSPITAL_BASED_OUTPATIENT_CLINIC_OR_DEPARTMENT_OTHER): Payer: Self-pay

## 2023-09-18 MED ORDER — LISDEXAMFETAMINE DIMESYLATE 70 MG PO CAPS
70.0000 mg | ORAL_CAPSULE | Freq: Every morning | ORAL | 0 refills | Status: DC
  Filled 2023-09-18: qty 90, 90d supply, fill #0

## 2023-09-19 ENCOUNTER — Other Ambulatory Visit (HOSPITAL_BASED_OUTPATIENT_CLINIC_OR_DEPARTMENT_OTHER): Payer: Self-pay

## 2023-09-21 ENCOUNTER — Other Ambulatory Visit: Payer: Self-pay

## 2023-10-14 ENCOUNTER — Other Ambulatory Visit: Payer: Self-pay

## 2023-10-15 ENCOUNTER — Other Ambulatory Visit (HOSPITAL_COMMUNITY): Payer: Self-pay

## 2023-10-16 ENCOUNTER — Other Ambulatory Visit: Payer: Self-pay

## 2023-10-19 ENCOUNTER — Other Ambulatory Visit: Payer: Self-pay

## 2023-11-04 ENCOUNTER — Other Ambulatory Visit: Payer: Self-pay | Admitting: Internal Medicine

## 2023-11-04 ENCOUNTER — Other Ambulatory Visit: Payer: Self-pay

## 2023-11-10 ENCOUNTER — Other Ambulatory Visit: Payer: Self-pay

## 2023-11-10 DIAGNOSIS — M25562 Pain in left knee: Secondary | ICD-10-CM | POA: Diagnosis not present

## 2023-11-10 DIAGNOSIS — Z79899 Other long term (current) drug therapy: Secondary | ICD-10-CM | POA: Diagnosis not present

## 2023-11-10 DIAGNOSIS — M791 Myalgia, unspecified site: Secondary | ICD-10-CM | POA: Diagnosis not present

## 2023-11-10 DIAGNOSIS — M0609 Rheumatoid arthritis without rheumatoid factor, multiple sites: Secondary | ICD-10-CM | POA: Diagnosis not present

## 2023-11-10 DIAGNOSIS — M2550 Pain in unspecified joint: Secondary | ICD-10-CM | POA: Diagnosis not present

## 2023-11-11 ENCOUNTER — Other Ambulatory Visit (HOSPITAL_COMMUNITY): Payer: Self-pay

## 2023-11-11 ENCOUNTER — Other Ambulatory Visit: Payer: Self-pay

## 2023-11-11 ENCOUNTER — Other Ambulatory Visit: Payer: Self-pay | Admitting: Pharmacist

## 2023-11-11 MED ORDER — RINVOQ 15 MG PO TB24: ORAL_TABLET | ORAL | 3 refills | Status: DC

## 2023-11-11 MED ORDER — RINVOQ 15 MG PO TB24
ORAL_TABLET | ORAL | 3 refills | Status: DC
  Filled 2023-11-11: qty 30, fill #0
  Filled 2023-11-11: qty 30, 30d supply, fill #0
  Filled 2023-12-08 (×2): qty 30, 30d supply, fill #1
  Filled 2024-01-04: qty 30, 30d supply, fill #2
  Filled 2024-02-03: qty 30, 30d supply, fill #3

## 2023-11-11 NOTE — Progress Notes (Signed)
 Specialty Pharmacy Refill Coordination Note  Natalie Ford is a 53 y.o. female contacted today regarding refills of specialty medication(s) Upadacitinib (Rinvoq)   Patient requested Delivery   Pickup date: 11/11/23   Medication will be filled on 11/11/23.

## 2023-11-12 ENCOUNTER — Other Ambulatory Visit (HOSPITAL_COMMUNITY): Payer: Self-pay

## 2023-11-12 MED ORDER — RINVOQ 15 MG PO TB24
ORAL_TABLET | ORAL | 3 refills | Status: DC
  Filled 2023-11-12: qty 30, 30d supply, fill #0

## 2023-11-23 ENCOUNTER — Other Ambulatory Visit (HOSPITAL_COMMUNITY): Payer: Self-pay

## 2023-11-27 DIAGNOSIS — M7712 Lateral epicondylitis, left elbow: Secondary | ICD-10-CM | POA: Diagnosis not present

## 2023-12-01 ENCOUNTER — Other Ambulatory Visit: Payer: Self-pay

## 2023-12-04 ENCOUNTER — Other Ambulatory Visit: Payer: Self-pay

## 2023-12-08 ENCOUNTER — Other Ambulatory Visit (HOSPITAL_COMMUNITY): Payer: Self-pay

## 2023-12-08 ENCOUNTER — Telehealth: Payer: Self-pay

## 2023-12-08 ENCOUNTER — Other Ambulatory Visit: Payer: Self-pay

## 2023-12-08 NOTE — Telephone Encounter (Signed)
 Per call center- PA is needed for Rinvoq, thank you!

## 2023-12-08 NOTE — Progress Notes (Signed)
 Message sent to Biologics coordinator Tammy Voncannon!

## 2023-12-08 NOTE — Telephone Encounter (Signed)
 Not an Asthma/Allergy patient-nothing further needed.

## 2023-12-08 NOTE — Progress Notes (Signed)
 Specialty Pharmacy Refill Coordination Note  Natalie Ford is a 53 y.o. female contacted today regarding refills of specialty medication(s) Rinvoq.  Patient requested (Patient-Rptd) Delivery   Delivery date: (Patient-Rptd) 12/14/23   Verified address: (Patient-Rptd) [address] [place]   Medication will be filled on 12/11/23.   This medication requires a new prior authorization, and is currently being processed by the PA team. Specialty Pharmacy team will contact patient if any delays arise.

## 2023-12-08 NOTE — Telephone Encounter (Signed)
 I didn't see she was patient of ours

## 2023-12-09 ENCOUNTER — Other Ambulatory Visit: Payer: Self-pay

## 2023-12-09 NOTE — Progress Notes (Signed)
 Pharmacy Patient Advocate Encounter   Received notification from Patient Pharmacy that prior authorization for Rinvoq is required/requested.   Insurance verification completed.   The patient is insured through [facility] .   Per test claim: PA required; PA submitted to above mentioned insurance via CoverMyMeds Key/confirmation #/EOC [id number] Status is pending

## 2023-12-09 NOTE — Progress Notes (Signed)
 PA approved.

## 2023-12-09 NOTE — Progress Notes (Signed)
 Pharmacy Patient Advocate Encounter  Received notification from [facility] that Prior Authorization for Rinvoq has been APPROVED from 12/09/23 to 12/08/24   PA #/Case ID/Reference #: [id number]

## 2023-12-10 ENCOUNTER — Other Ambulatory Visit (HOSPITAL_COMMUNITY): Payer: Self-pay

## 2023-12-11 ENCOUNTER — Other Ambulatory Visit: Payer: Self-pay

## 2023-12-17 ENCOUNTER — Other Ambulatory Visit: Payer: Self-pay

## 2023-12-17 ENCOUNTER — Other Ambulatory Visit (HOSPITAL_BASED_OUTPATIENT_CLINIC_OR_DEPARTMENT_OTHER): Payer: Self-pay

## 2023-12-17 MED ORDER — LISDEXAMFETAMINE DIMESYLATE 70 MG PO CAPS
70.0000 mg | ORAL_CAPSULE | Freq: Every day | ORAL | 0 refills | Status: DC
  Filled 2023-12-17: qty 90, 90d supply, fill #0

## 2023-12-29 ENCOUNTER — Other Ambulatory Visit: Payer: Self-pay

## 2024-01-04 ENCOUNTER — Other Ambulatory Visit: Payer: Self-pay

## 2024-01-04 NOTE — Progress Notes (Signed)
 Specialty Pharmacy Refill Coordination Note  Natalie Ford is a 53 y.o. female contacted today regarding refills of specialty medication(s) Upadacitinib  (Rinvoq )   Patient requested Delivery   Delivery date: 01/05/24   Verified address: [address] [place]   Medication will be filled on 01/04/24.

## 2024-01-04 NOTE — Progress Notes (Signed)
 Specialty Pharmacy Ongoing Clinical Assessment Note  Natalie Ford is a 53 y.o. female who is being followed by the specialty pharmacy service for RxSp Rheumatoid Arthritis   Patient's specialty medication(s) reviewed today: Upadacitinib  (Rinvoq )   Missed doses in the last 4 weeks: 0   Patient/Caregiver did not have any additional questions or concerns.   Therapeutic benefit summary: Patient is achieving benefit   Adverse events/side effects summary: No adverse events/side effects   Patient's therapy is appropriate to: Continue    Goals Addressed             This Visit's Progress    Reduce inflammation   On track    Patient is on track. Patient will maintain adherence         Follow up: 6 months  [facility] Specialty Pharmacist

## 2024-01-05 ENCOUNTER — Other Ambulatory Visit (HOSPITAL_COMMUNITY): Payer: Self-pay

## 2024-01-08 ENCOUNTER — Other Ambulatory Visit (HOSPITAL_COMMUNITY): Payer: Self-pay

## 2024-01-23 ENCOUNTER — Other Ambulatory Visit: Payer: Self-pay | Admitting: Psychiatry

## 2024-01-28 ENCOUNTER — Other Ambulatory Visit: Payer: Self-pay | Admitting: Psychiatry

## 2024-02-03 ENCOUNTER — Other Ambulatory Visit: Payer: Self-pay

## 2024-02-03 ENCOUNTER — Other Ambulatory Visit: Payer: Self-pay | Admitting: Pharmacy Technician

## 2024-02-03 NOTE — Progress Notes (Signed)
 Specialty Pharmacy Refill Coordination Note  Natalie Ford is a 53 y.o. female contacted today regarding refills of specialty medication(s) Upadacitinib  (Rinvoq )   Patient requested Delivery   Delivery date: 02/05/24   Verified address: [address]  [place]   Medication will be filled on 02/04/24.

## 2024-02-24 ENCOUNTER — Other Ambulatory Visit: Payer: Self-pay

## 2024-02-25 ENCOUNTER — Other Ambulatory Visit (HOSPITAL_COMMUNITY): Payer: Self-pay

## 2024-02-25 ENCOUNTER — Other Ambulatory Visit: Payer: Self-pay

## 2024-02-25 ENCOUNTER — Other Ambulatory Visit: Payer: Self-pay | Admitting: Pharmacist

## 2024-02-25 MED ORDER — RINVOQ 15 MG PO TB24: ORAL_TABLET | ORAL | 3 refills | Status: DC

## 2024-02-25 MED ORDER — RINVOQ 15 MG PO TB24
ORAL_TABLET | ORAL | 3 refills | Status: DC
  Filled 2024-02-25 – 2024-03-01 (×2): qty 30, 30d supply, fill #0
  Filled 2024-03-25: qty 30, 30d supply, fill #1
  Filled 2024-04-26 – 2024-04-30 (×2): qty 30, 30d supply, fill #2
  Filled 2024-05-27: qty 30, 30d supply, fill #3

## 2024-02-29 ENCOUNTER — Other Ambulatory Visit: Payer: Self-pay

## 2024-03-01 ENCOUNTER — Other Ambulatory Visit: Payer: Self-pay

## 2024-03-01 NOTE — Progress Notes (Signed)
 Specialty Pharmacy Refill Coordination Note  Natalie Ford is a 53 y.o. female contacted today regarding refills of specialty medication(s) Upadacitinib  (Rinvoq )   Patient requested Delivery   Delivery date: 03/07/24   Verified address: [address]  [place]    Medication will be filled on 03/04/24.

## 2024-03-03 ENCOUNTER — Other Ambulatory Visit: Payer: Self-pay

## 2024-03-03 DIAGNOSIS — M791 Myalgia, unspecified site: Secondary | ICD-10-CM | POA: Diagnosis not present

## 2024-03-03 DIAGNOSIS — Z79899 Other long term (current) drug therapy: Secondary | ICD-10-CM | POA: Diagnosis not present

## 2024-03-03 DIAGNOSIS — M8580 Other specified disorders of bone density and structure, unspecified site: Secondary | ICD-10-CM | POA: Diagnosis not present

## 2024-03-03 DIAGNOSIS — M0609 Rheumatoid arthritis without rheumatoid factor, multiple sites: Secondary | ICD-10-CM | POA: Diagnosis not present

## 2024-03-03 DIAGNOSIS — M2550 Pain in unspecified joint: Secondary | ICD-10-CM | POA: Diagnosis not present

## 2024-03-15 NOTE — Progress Notes (Unsigned)
 [facility] [address] [place]. [place] [address] [phone]       OFFICE FOLLOW UP VISIT NOTE  [name] Date of Birth:  [birth year] Medical Record Number:  [id number]    Primary neurologist: [name] Referring MD: [name], [name] Reason for Referral: Stroke like episode  No chief complaint on file.    HPI:   Update 03/16/2024 [name] for yearly follow-up visit via MyChart video visit.  Remains on propranolol  40 mg 3 times daily for migraine prevention and use of Nurtec for rescue      Update 03/17/2023 JM: reports she has been doing well since prior visit.  She no longer is experiencing daily tension type headaches.  She does experience migraine headache twice per month, use of Nurtec with resolution. Can worsen some months based on stress and weather changes.  Continues on propranolol  40 mg 3 times daily for preventative, occasionally will miss evening dose. No new neurological or stroke/TIA symptoms. Currently being followed by orthopedics for left hand numbness, gradually worsening, has EMG/NCV scheduled tomorrow and possible decompression based on results. Continues to follow with rheumatology for rheumatoid arthritis, has been gradually decreasing prednisone  dosage.  Update 08/13/2021 JM: Returns for follow-up visit after prior visit 3 months ago with [name] for prior strokelike episode possibly complicated migraine.  During the interval time, propranolol  further increased to 60 mg twice daily with some improvement. Use of Fioricet  for emergent relief but unable to tolerate due to increased fatigue after taking.  Currently experiencing almost daily dull tension type headaches in approximately 2-3 more severe headaches per month associated with phonophobia.  Denies photophobia or N/V.  Daily use of Tylenol  in the morning as she will frequently awaken with headache.  Prior sleep study 10/2020 negative for sleep apnea. She  believes headaches have been worse since COVID back in September. She also gets Orencia  infusions for RA with bad headache after.  Denies new or reoccurring strokelike symptoms.  Compliant on aspirin  and Crestor without side effects.  Blood pressure today 115/77.  TCD unremarkable.  No further concerns at this time  Update 05/14/21 [name]: She returns for follow-up after last visit 7 months ago.  Patient was seen in the ER on 12/28/2020 with headaches and fleeting left-sided paresthesias.  CT head was negative for acute abnormality.  She was given headache cocktail and symptoms resolved.  Patient was diagnosed with COVID infection was the third time on September 6 and is recovering from that.  She continues to have migraine headaches which occur about 3-4 times a month.  She has been started on Inderal  30 mg twice daily for tachycardia which she has had post-COVID.  Her heart rate is still elevated at 95 today.  She is also been diagnosed with rheumatoid arthritis and started on Orencia  monthly injections.  She complains of occasional pins-and-needles in the feet at night but these are not bothersome or frequent.  Patient does not want any work-up for peripheral neuropathy at this time.  She denies any difficulty with balance or gait weakness.  She remains on aspirin  which is tolerating well without bruising or bleeding and Crestor which is tolerating without muscle aches and pains.  Initial visit 10/03/2020 Dr. [name] is a 53 year old Caucasian lady seen today for initial office consultation visit for strokelike episode.  History is obtained from the patient, review of electronic medical records and I personally reviewed available imaging films in PACS.  She has past medical  history of hyperlipidemia, dyspepsia, irritable bowel syndrome, COVID-19 infection who presented on 08/05/2020 with sudden onset of right-sided weakness numbness right upper quadrant vision difficulties and spinning sensation.   His symptoms initially improved but she continued to have focal deficits since she was given IV TPA after discussion of risk benefit.  Her symptoms resolved after that.  She was kept in the neurological intensive care unit and blood pressure tightly controlled.  Subsequently in the CT angiogram and head and neck were obtained prior to TPA which were unremarkable.  Subsequently MRI scan of the brain was obtained which was normal.  2D echo showed ejection fraction of 55 to 60% without cardiac source of embolism.  LDL cholesterol was elevated at 1 1 7  mg percent and hemoglobin A1c was 4.8.  She is started on aspirin  and diet controlled for lipids.  Patient states that she has done well since discharge she has had no recurrent episodes.  She has had a lot of muscle aches and pains and has been diagnosed with rheumatoid arthritis and is planning on starting Enbrel  soon.  She continues to have some blurred vision and has seen an ophthalmologist was prescribed Eysuvis eyedrops which she is using.  She also feels easy fatigability and feels a lot of brain fog.  She has no significant vascular risk factors except hyperlipidemia.  She is tolerating aspirin  well without bruising or bleeding.  She has no new complaints.  She denies any prior history of migraine headaches similar episodes in the past.          ROS:   14 system review of systems is positive for those listed in HPI and pains and all other systems negative  PMH:  Past Medical History:  Diagnosis Date   Allergic rhinitis    COVID-19    x 3   Dyspepsia    Hypercholesteremia    IBS (irritable bowel syndrome)    Lumbosacral strain    RA (rheumatoid arthritis) (HCC)    Stroke (HCC) 08/05/2020   tia    Social History:  Social History   Socioeconomic History   Marital status: Married    Spouse name: Not on file   Number of children: 2   Years of education: Not on file   Highest education level: Not on file  Occupational History    Occupation: Charity fundraiser, Psychologist, forensic: [employer]   Occupation: Charity fundraiser, Scientist, research (physical sciences)    Comment: [employer]  Tobacco Use   Smoking status: Never   Smokeless tobacco: Never   Tobacco comments:    exposed to second hand smoke  Vaping Use   Vaping status: Never Used  Substance and Sexual Activity   Alcohol use: Yes    Comment: social use   Drug use: No   Sexual activity: Not Currently  Other Topics Concern   Not on file  Social History Narrative   Lives with 2 children and husband   Right Handed   Drinks 1-2 cups caffeine  daily   Social Drivers of Health   Financial Resource Strain: Not on file  Food Insecurity: Not on file  Transportation Needs: Not on file  Physical Activity: Not on file  Stress: Not on file  Social Connections: Unknown (01/07/2022)   Received from [facility]   Social Network    Social Network: Not on file  Intimate Partner Violence: Unknown (11/28/2021)   Received from [facility]   HITS    Physically Hurt: Not on file  Insult or Talk Down To: Not on file    Threaten Physical Harm: Not on file    Scream or Curse: Not on file    Medications:   Current Outpatient Medications on File Prior to Visit  Medication Sig Dispense Refill   albuterol  (VENTOLIN  HFA) 108 (90 Base) MCG/ACT inhaler Inhale 1-2 puffs into the lungs every 6 (six) hours as needed for wheezing or shortness of breath. 18 g 5   aspirin  EC 81 MG EC tablet Take 1 tablet (81 mg total) by mouth daily. Swallow whole. 30 tablet 11   buPROPion  (WELLBUTRIN  XL) 150 MG 24 hr tablet TAKE THREE TABLETS BY MOUTH EVERY MORNING 90 tablet 5   cycloSPORINE , PF, (CEQUA ) 0.09 % SOLN Place 1 drop into both eyes 2 times a day 180 each 3   fluticasone  (FLONASE ) 50 MCG/ACT nasal spray Place 2 sprays into both nostrils daily.     fluticasone -salmeterol (ADVAIR  HFA) 230-21 MCG/ACT inhaler Inhale 2 puffs into the lungs 2 (two) times daily. 1 Inhaler 12   gabapentin  (NEURONTIN ) 400 MG capsule       lisdexamfetamine (VYVANSE ) 70 MG capsule Take 1 capsule (70 mg total) by mouth in the morning. (Patient not taking: Reported on 03/17/2023) 90 capsule 0   lisdexamfetamine (VYVANSE ) 70 MG capsule Take 1 capsule (70 mg total) by mouth every morning. 90 capsule 0   lisdexamfetamine (VYVANSE ) 70 MG capsule Take 1 capsule (70 mg total) by mouth daily. 90 capsule 0   Multiple Vitamin (MULTIVITAMIN) capsule Take 1 capsule by mouth at bedtime.     Omega-3 Fatty Acids (FISH OIL) 1000 MG CAPS Take 2 capsules by mouth daily.     predniSONE  (DELTASONE ) 1 MG tablet TAKE 4 TABLETS BY MOUTH EVERY MORNING WITH BREAKFAST 100 tablet 3   predniSONE  (DELTASONE ) 1 MG tablet Take 4 tablets (4 mg total) by mouth daily. (Patient not taking: Reported on 03/17/2023) 360 tablet 0   propranolol  (INDERAL ) 40 MG tablet TAKE 1 TABLET BY MOUTH 3 TIMES A DAY 270 tablet 0   Rimegepant Sulfate  (NURTEC) 75 MG TBDP Take 1 tablet (75 mg total) by mouth daily as needed (migriane headache). 8 tablet 2   rosuvastatin (CRESTOR) 10 MG tablet Take 10 mg by mouth daily.     Upadacitinib  ER (RINVOQ ) 15 MG TB24 1 tablet Orally Once a day 30 days 30 tablet 3   Current Facility-Administered Medications on File Prior to Visit  Medication Dose Route Frequency Provider Last Rate Last Admin   lidocaine  (XYLOCAINE ) 1 % (with pres) injection 3 mL  3 mL Other Once Raulkar, Sven SQUIBB, MD       sodium chloride  (PF) 0.9 % injection 2 mL  2 mL Intravenous PRN Raulkar, Sven SQUIBB, MD        Allergies:   Allergies  Allergen Reactions   Penicillins Rash    rash      Physical Exam There were no vitals filed for this visit.   There is no height or weight on file to calculate BMI.   General: Pleasant well developed, well nourished very pleasant middle-aged Caucasian lady, seated, in no evident distress Head: head normocephalic and atraumatic.   Neck: supple with no carotid or supraclavicular bruits Cardiovascular: regular rate and rhythm, no  murmurs Musculoskeletal: no deformity Skin:  no rash/petichiae Vascular:  Normal pulses all extremities  Neurologic Exam Mental Status: Awake and fully alert. Oriented to place and time. Recent and remote memory intact. Attention span, concentration and fund of  knowledge appropriate. Mood and affect appropriate.  Cranial Nerves: Pupils equal, briskly reactive to light. Extraocular movements full without nystagmus. Visual fields full to confrontation. Hearing intact. Facial sensation intact. Face, tongue, palate moves normally and symmetrically.  Motor: Normal bulk and tone. Normal strength in all tested extremity muscles. Coordination: Rapid alternating movements normal in all extremities. Finger-to-nose and heel-to-shin performed accurately bilaterally. Gait and Station: Arises from chair without difficulty. Stance is normal. Gait demonstrates normal stride length and balance . Able to heel, toe and tandem walk without difficulty.  Reflexes: 1+ and symmetric. Toes downgoing.       ASSESSMENT/PLAN: 53 year old Caucasian lady with transient episode of right-sided weakness numbness, spinning sensation and vision deficits, episode possibly complicated migraine treated with IV TPA with full recovery.  Brain imaging negative for acute stroke.  Vascular risk factors of hyperlipidemia only.  Episode of headache with fleeting left-sided paresthesias possibly complicated migraine episode in May 2022. No longer experiencing daily tension type headaches, reports 2 migraine headache days per month.     -Continue propranolol  40 mg 3 times daily for preventative -Continue Nurtec 75 mg daily as needed -refills provided -Would not recommend triptan due to strokelike episode s/p tPA -Hesitant to trial antidepressant such as tricyclic due to current use of high-dose bupropion  -Continue aspirin  81 mg daily and Crestor 10 mg daily for secondary stroke prevention measures -Continue to follow with PCP for  aggressive stroke risk factor management   Follow-up in 1 year or call earlier if needed     CC:  Clarice Nottingham, MD   I personally spent a total of *** minutes in the care of the patient today including {Time Based Coding:210964241}.   Harlene Bogaert, AGNP-BC  [facility] [address] [place], [place] [address]  Phone [phone] Fax [phone] Note: This document was prepared with digital dictation and possible smart phrase technology. Any transcriptional errors that result from this process are unintentional.

## 2024-03-16 ENCOUNTER — Telehealth: Admitting: Adult Health

## 2024-03-16 ENCOUNTER — Telehealth (HOSPITAL_COMMUNITY): Payer: Self-pay | Admitting: Pharmacy Technician

## 2024-03-16 ENCOUNTER — Other Ambulatory Visit (HOSPITAL_COMMUNITY): Payer: Self-pay

## 2024-03-16 ENCOUNTER — Encounter: Payer: Self-pay | Admitting: Adult Health

## 2024-03-16 ENCOUNTER — Telehealth (HOSPITAL_COMMUNITY): Payer: Self-pay

## 2024-03-16 DIAGNOSIS — G43009 Migraine without aura, not intractable, without status migrainosus: Secondary | ICD-10-CM

## 2024-03-16 DIAGNOSIS — R2990 Unspecified symptoms and signs involving the nervous system: Secondary | ICD-10-CM

## 2024-03-16 MED ORDER — QULIPTA 60 MG PO TABS
60.0000 mg | ORAL_TABLET | Freq: Every day | ORAL | 11 refills | Status: AC
  Filled 2024-03-16 – 2024-05-12 (×3): qty 30, 30d supply, fill #0

## 2024-03-16 MED ORDER — NURTEC 75 MG PO TBDP
75.0000 mg | ORAL_TABLET | Freq: Every day | ORAL | 11 refills | Status: AC | PRN
  Filled 2024-03-16: qty 8, 30d supply, fill #0

## 2024-03-16 NOTE — Telephone Encounter (Signed)
 PA request has been Approved. New Encounter has been or will be created for follow up. For additional info see Pharmacy Prior Auth telephone encounter from 03/16/24.

## 2024-03-16 NOTE — Patient Instructions (Signed)
 It was great to see you today!   I recommend you start Qulipta  60 mg daily for further migraine prevention  Continue propranolol  40 mg 3 times daily  Continue Nurtec as needed for now but can consider trying Ubrelvy if Nurtec remains ineffective  Continue routine follow-up with your PCP for stroke risk factor management    Follow-up in 6 months or call earlier if needed

## 2024-03-16 NOTE — Telephone Encounter (Signed)
 Pharmacy Patient Advocate Encounter  Received notification from [facility] that Prior Authorization for Nurtec 75 mg ODT has been APPROVED from 02/24/24 to 08/24/24. Ran test claim, Copay is $0. This test claim was processed through [facility]- copay amounts may vary at other pharmacies due to pharmacy/plan contracts, or as the patient moves through the different stages of their insurance plan.   PA #/Case ID/Reference #: --  *insurance will only allow 8 tablets every 30 days

## 2024-03-17 ENCOUNTER — Other Ambulatory Visit (HOSPITAL_COMMUNITY): Payer: Self-pay

## 2024-03-17 ENCOUNTER — Telehealth: Payer: 59 | Admitting: Adult Health

## 2024-03-17 ENCOUNTER — Other Ambulatory Visit: Payer: Self-pay

## 2024-03-17 MED ORDER — LISDEXAMFETAMINE DIMESYLATE 70 MG PO CAPS
70.0000 mg | ORAL_CAPSULE | Freq: Every morning | ORAL | 0 refills | Status: AC
  Filled 2024-03-17: qty 90, 90d supply, fill #0

## 2024-03-25 ENCOUNTER — Other Ambulatory Visit: Payer: Self-pay

## 2024-03-25 ENCOUNTER — Other Ambulatory Visit (HOSPITAL_COMMUNITY): Payer: Self-pay

## 2024-03-25 NOTE — Progress Notes (Signed)
 Specialty Pharmacy Refill Coordination Note  Spoke with Natalie Ford  [name] is a 53 y.o. female contacted today regarding refills of specialty medication(s) Upadacitinib  (Rinvoq )  Doses on hand: 16  Patient requested: Delivery   Delivery date: 04/01/24   Verified address: [address] [place] [address]  Medication will be filled on 03/31/24.

## 2024-03-29 DIAGNOSIS — M25562 Pain in left knee: Secondary | ICD-10-CM | POA: Diagnosis not present

## 2024-04-22 ENCOUNTER — Other Ambulatory Visit: Payer: Self-pay

## 2024-04-23 ENCOUNTER — Other Ambulatory Visit: Payer: Self-pay | Admitting: Psychiatry

## 2024-04-26 ENCOUNTER — Other Ambulatory Visit: Payer: Self-pay

## 2024-04-28 ENCOUNTER — Other Ambulatory Visit (HOSPITAL_COMMUNITY): Payer: Self-pay

## 2024-04-28 ENCOUNTER — Other Ambulatory Visit: Payer: Self-pay

## 2024-04-28 ENCOUNTER — Encounter (HOSPITAL_COMMUNITY): Payer: Self-pay

## 2024-04-28 ENCOUNTER — Other Ambulatory Visit (HOSPITAL_BASED_OUTPATIENT_CLINIC_OR_DEPARTMENT_OTHER): Payer: Self-pay

## 2024-04-28 NOTE — Progress Notes (Signed)
 Specialty Pharmacy Refill Coordination Note  Natalie Ford is a 53 y.o. female contacted today regarding refills of specialty medication(s) Upadacitinib  (Rinvoq )   Patient requested Delivery   Delivery date: 05/02/24   Verified address: [address] [place] [address]   Medication will be filled on 04/29/24.

## 2024-04-30 ENCOUNTER — Other Ambulatory Visit (HOSPITAL_COMMUNITY): Payer: Self-pay

## 2024-05-02 ENCOUNTER — Other Ambulatory Visit: Payer: Self-pay

## 2024-05-09 ENCOUNTER — Other Ambulatory Visit (HOSPITAL_COMMUNITY): Payer: Self-pay

## 2024-05-09 ENCOUNTER — Other Ambulatory Visit: Payer: Self-pay

## 2024-05-09 ENCOUNTER — Telehealth: Payer: Self-pay

## 2024-05-09 NOTE — Telephone Encounter (Signed)
 Pharmacy Patient Advocate Encounter   Received notification from CoverMyMeds that prior authorization for Qulipta  60mg  Tablet is required/requested.   Insurance verification completed.   The patient is insured through [facility] .   Per test claim: PA required; PA submitted to above mentioned insurance via Latent Key/confirmation #/EOC [facility] Status is pending

## 2024-05-10 ENCOUNTER — Other Ambulatory Visit (HOSPITAL_COMMUNITY): Payer: Self-pay

## 2024-05-10 NOTE — Telephone Encounter (Signed)
 Pharmacy Patient Advocate Encounter  Received notification from [employer] that Prior Authorization for Qulipta  has been APPROVED from 05/09/2024 to 11/05/2024. Ran test claim, Copay is $0. This test claim was processed through [facility]- copay amounts may vary at other pharmacies due to pharmacy/plan contracts, or as the patient moves through the different stages of their insurance plan.   PA #/Case ID/Reference #: [id number]

## 2024-05-12 ENCOUNTER — Other Ambulatory Visit: Payer: Self-pay

## 2024-05-12 ENCOUNTER — Other Ambulatory Visit (HOSPITAL_COMMUNITY): Payer: Self-pay

## 2024-05-12 ENCOUNTER — Encounter: Payer: Self-pay | Admitting: Adult Health

## 2024-05-20 ENCOUNTER — Other Ambulatory Visit (HOSPITAL_COMMUNITY): Payer: Self-pay

## 2024-05-20 ENCOUNTER — Other Ambulatory Visit: Payer: Self-pay

## 2024-05-20 MED ORDER — GABAPENTIN 100 MG PO CAPS
200.0000 mg | ORAL_CAPSULE | Freq: Three times a day (TID) | ORAL | 1 refills | Status: AC
  Filled 2024-05-20: qty 180, 30d supply, fill #0
  Filled 2024-07-03: qty 180, 30d supply, fill #1

## 2024-05-25 ENCOUNTER — Other Ambulatory Visit: Payer: Self-pay

## 2024-05-25 ENCOUNTER — Other Ambulatory Visit (HOSPITAL_COMMUNITY): Payer: Self-pay

## 2024-05-25 DIAGNOSIS — Z1389 Encounter for screening for other disorder: Secondary | ICD-10-CM | POA: Diagnosis not present

## 2024-05-25 DIAGNOSIS — Z01419 Encounter for gynecological examination (general) (routine) without abnormal findings: Secondary | ICD-10-CM | POA: Diagnosis not present

## 2024-05-25 DIAGNOSIS — N951 Menopausal and female climacteric states: Secondary | ICD-10-CM | POA: Diagnosis not present

## 2024-05-25 DIAGNOSIS — Z1231 Encounter for screening mammogram for malignant neoplasm of breast: Secondary | ICD-10-CM | POA: Diagnosis not present

## 2024-05-25 MED ORDER — COMBIPATCH 0.05-0.14 MG/DAY TD PTTW
MEDICATED_PATCH | TRANSDERMAL | 1 refills | Status: AC
  Filled 2024-05-25: qty 24, 84d supply, fill #0

## 2024-05-27 ENCOUNTER — Other Ambulatory Visit: Payer: Self-pay

## 2024-05-27 NOTE — Progress Notes (Signed)
 Clinical Intervention Note  Clinical Intervention Notes: Patient reports initiating treatment with Qulipta . No DDIs with Rinvoq  identified.   Clinical Intervention Outcomes: Prevention of an adverse drug event   Natalie Ford Specialty Pharmacist

## 2024-05-27 NOTE — Progress Notes (Signed)
 Specialty Pharmacy Refill Coordination Note  Natalie Ford is a 53 y.o. female contacted today regarding refills of specialty medication(s) Upadacitinib  (Rinvoq )   Patient requested Delivery   Delivery date: 05/31/24   Verified address: [address] [place] [address]   Medication will be filled on 05/30/24.

## 2024-05-30 ENCOUNTER — Other Ambulatory Visit (HOSPITAL_COMMUNITY): Payer: Self-pay

## 2024-06-06 DIAGNOSIS — M25562 Pain in left knee: Secondary | ICD-10-CM | POA: Diagnosis not present

## 2024-06-06 DIAGNOSIS — M2550 Pain in unspecified joint: Secondary | ICD-10-CM | POA: Diagnosis not present

## 2024-06-06 DIAGNOSIS — Z79899 Other long term (current) drug therapy: Secondary | ICD-10-CM | POA: Diagnosis not present

## 2024-06-06 DIAGNOSIS — M791 Myalgia, unspecified site: Secondary | ICD-10-CM | POA: Diagnosis not present

## 2024-06-06 DIAGNOSIS — M0609 Rheumatoid arthritis without rheumatoid factor, multiple sites: Secondary | ICD-10-CM | POA: Diagnosis not present

## 2024-06-09 ENCOUNTER — Telehealth: Payer: Self-pay | Admitting: Pharmacist

## 2024-06-09 NOTE — Telephone Encounter (Signed)
 Called patient to schedule an appointment for the [facility] Specialty Medication Clinic. I was unable to reach the patient so I left a HIPAA-compliant message requesting that the patient return my call.   Herlene Fleeta Morris, PharmD, JAQUELINE, CPP Clinical Pharmacist [facility] [phone]

## 2024-06-10 DIAGNOSIS — E7800 Pure hypercholesterolemia, unspecified: Secondary | ICD-10-CM | POA: Diagnosis not present

## 2024-06-10 DIAGNOSIS — Z0000 Encounter for general adult medical examination without abnormal findings: Secondary | ICD-10-CM | POA: Diagnosis not present

## 2024-06-10 DIAGNOSIS — R739 Hyperglycemia, unspecified: Secondary | ICD-10-CM | POA: Diagnosis not present

## 2024-06-10 DIAGNOSIS — E041 Nontoxic single thyroid nodule: Secondary | ICD-10-CM | POA: Diagnosis not present

## 2024-06-14 ENCOUNTER — Other Ambulatory Visit: Payer: Self-pay | Admitting: Nurse Practitioner

## 2024-06-14 DIAGNOSIS — R5382 Chronic fatigue, unspecified: Secondary | ICD-10-CM | POA: Diagnosis not present

## 2024-06-14 DIAGNOSIS — M0609 Rheumatoid arthritis without rheumatoid factor, multiple sites: Secondary | ICD-10-CM | POA: Diagnosis not present

## 2024-06-14 DIAGNOSIS — E041 Nontoxic single thyroid nodule: Secondary | ICD-10-CM | POA: Diagnosis not present

## 2024-06-17 ENCOUNTER — Other Ambulatory Visit: Payer: Self-pay

## 2024-06-17 ENCOUNTER — Other Ambulatory Visit (HOSPITAL_COMMUNITY): Payer: Self-pay

## 2024-06-17 MED ORDER — RINVOQ 15 MG PO TB24
ORAL_TABLET | ORAL | 3 refills | Status: DC
  Filled 2024-08-05: qty 30, fill #0

## 2024-06-18 ENCOUNTER — Other Ambulatory Visit (HOSPITAL_BASED_OUTPATIENT_CLINIC_OR_DEPARTMENT_OTHER): Payer: Self-pay

## 2024-06-18 MED ORDER — LISDEXAMFETAMINE DIMESYLATE 70 MG PO CAPS
70.0000 mg | ORAL_CAPSULE | Freq: Every morning | ORAL | 0 refills | Status: DC
  Filled 2024-06-18: qty 90, 90d supply, fill #0

## 2024-06-21 ENCOUNTER — Other Ambulatory Visit (HOSPITAL_COMMUNITY): Payer: Self-pay

## 2024-06-22 ENCOUNTER — Ambulatory Visit
Admission: RE | Admit: 2024-06-22 | Discharge: 2024-06-22 | Disposition: A | Discharge status: Home / Self Care | Source: Ambulatory Visit | Attending: Nurse Practitioner | Admitting: Nurse Practitioner

## 2024-06-22 DIAGNOSIS — E041 Nontoxic single thyroid nodule: Secondary | ICD-10-CM

## 2024-06-23 ENCOUNTER — Other Ambulatory Visit: Payer: Self-pay

## 2024-06-24 ENCOUNTER — Other Ambulatory Visit: Payer: Self-pay | Admitting: Nurse Practitioner

## 2024-06-24 DIAGNOSIS — E041 Nontoxic single thyroid nodule: Secondary | ICD-10-CM

## 2024-07-01 ENCOUNTER — Ambulatory Visit
Admission: RE | Admit: 2024-07-01 | Discharge: 2024-07-01 | Disposition: A | Discharge status: Home / Self Care | Source: Ambulatory Visit | Attending: Nurse Practitioner | Admitting: Nurse Practitioner

## 2024-07-01 DIAGNOSIS — E049 Nontoxic goiter, unspecified: Secondary | ICD-10-CM | POA: Diagnosis not present

## 2024-07-01 DIAGNOSIS — E041 Nontoxic single thyroid nodule: Secondary | ICD-10-CM

## 2024-07-20 ENCOUNTER — Other Ambulatory Visit: Payer: Self-pay | Admitting: Psychiatry

## 2024-08-01 ENCOUNTER — Other Ambulatory Visit: Payer: Self-pay | Admitting: Psychiatry

## 2024-08-05 ENCOUNTER — Other Ambulatory Visit: Payer: Self-pay

## 2024-08-05 ENCOUNTER — Encounter: Payer: Self-pay | Admitting: Pharmacist

## 2024-08-05 ENCOUNTER — Other Ambulatory Visit (HOSPITAL_COMMUNITY): Payer: Self-pay

## 2024-08-05 ENCOUNTER — Ambulatory Visit: Attending: Internal Medicine | Admitting: Pharmacist

## 2024-08-05 DIAGNOSIS — Z79899 Other long term (current) drug therapy: Secondary | ICD-10-CM

## 2024-08-05 MED ORDER — RINVOQ 15 MG PO TB24
ORAL_TABLET | ORAL | 3 refills | Status: AC
  Filled 2024-08-05: qty 30, 30d supply, fill #0
  Filled 2024-08-26 – 2024-08-30 (×2): qty 30, 30d supply, fill #1
  Filled 2024-09-26: qty 30, 30d supply, fill #2

## 2024-08-05 NOTE — Progress Notes (Signed)
 Specialty Pharmacy Refill Coordination Note  Natalie Ford is a 53 y.o. female contacted today regarding refills of specialty medication(s) Upadacitinib  (Rinvoq )   Patient requested Delivery   Delivery date: 08/08/24   Verified address: [address] [place] [address]   Medication will be filled on: 08/05/24

## 2024-08-05 NOTE — Progress Notes (Signed)
°  S: Patient presents today for review of their specialty medication.   Patient is currently taking Rinvoq  for rheumatoid arthritis. Patient is managed by Dr. Aryal for this.   Dosing: Adult  Note: May be used as monotherapy or in combination with methotrexate or other nonbiologic disease-modifying antirheumatic drugs (DMARDs); use in combination with biologic DMARDS or potent immunosuppressants (eg, azathioprine, cyclosporine ) is not recommended. Do not initiate therapy in patients with an absolute lymphocyte count <500/mm3, ANC <1,000/mm3, or hemoglobin <8 g/dL. Rheumatoid arthritis: Oral: 15 mg once daily.  Adherence: confirms.   Efficacy: pt feels that the medication is working for her. Can tell increase in pain with colder weather.   Monitoring: S/sx thromboembolism: none S/sx malignancy: none  S/sx of infection: none  Current adverse effects: none  O:     Lab Results  Component Value Date   WBC 8.1 08/13/2022   HGB 14.9 08/13/2022   HCT 44.1 08/13/2022   MCV 100 (H) 08/13/2022   PLT 317 08/13/2022      Chemistry      Component Value Date/Time   NA 139 08/13/2022 1139   K 4.5 08/13/2022 1139   CL 103 08/13/2022 1139   CO2 22 08/13/2022 1139   BUN 23 08/13/2022 1139   CREATININE 0.84 08/13/2022 1139   CREATININE 0.92 05/30/2021 1414   CREATININE 0.87 11/15/2019 1546      Component Value Date/Time   CALCIUM  9.7 08/13/2022 1139   ALKPHOS 59 08/13/2022 1139   AST 24 08/13/2022 1139   AST 16 05/30/2021 1414   ALT 27 08/13/2022 1139   ALT 18 05/30/2021 1414   BILITOT 0.3 08/13/2022 1139   BILITOT 0.4 05/30/2021 1414       Lab Results  Component Value Date   CHOL 184 08/13/2022   HDL 97 08/13/2022   LDLCALC 64 08/13/2022   LDLDIRECT 123.2 07/13/2007   TRIG 142 08/13/2022   CHOLHDL 1.9 08/13/2022     A/P: 1. Medication review: patient currently taking Rinvoq  for rheumatoid arthritis. Reviewed the medication with the patient, including the following:  Rinvoq  is a medication used to treat rheumatoid arthritis. Administer with or without food. Swallow tablet whole; do not crush, split, or chew. Possible adverse effects include increased risk of infection, GI upset, hematologic toxicity, hepatic effects, lipid abnormalities, increased risk of malignancy, thromboembolism. Avoid live vaccinations. No recommendations for any changes.  Herlene Fleeta Morris, PharmD, JAQUELINE, CPP Clinical Pharmacist [facility] [phone]

## 2024-08-26 ENCOUNTER — Other Ambulatory Visit (HOSPITAL_COMMUNITY): Payer: Self-pay

## 2024-08-30 ENCOUNTER — Other Ambulatory Visit: Payer: Self-pay

## 2024-09-01 ENCOUNTER — Other Ambulatory Visit: Payer: Self-pay

## 2024-09-01 NOTE — Progress Notes (Signed)
 Specialty Pharmacy Refill Coordination Note  Natalie Ford is a 54 y.o. female contacted today regarding refills of specialty medication(s) Upadacitinib  (Rinvoq )   Patient requested Delivery   Delivery date: 09/05/24   Verified address: [address] [place] [address]   Medication will be filled on: 09/02/24

## 2024-09-02 ENCOUNTER — Other Ambulatory Visit: Payer: Self-pay

## 2024-09-09 ENCOUNTER — Encounter: Payer: Self-pay | Admitting: Rheumatology

## 2024-09-09 ENCOUNTER — Other Ambulatory Visit: Payer: Self-pay | Admitting: Rheumatology

## 2024-09-09 DIAGNOSIS — M25562 Pain in left knee: Secondary | ICD-10-CM

## 2024-09-10 ENCOUNTER — Ambulatory Visit
Admission: RE | Admit: 2024-09-10 | Discharge: 2024-09-10 | Disposition: A | Source: Ambulatory Visit | Attending: Rheumatology | Admitting: Rheumatology

## 2024-09-10 DIAGNOSIS — M25562 Pain in left knee: Secondary | ICD-10-CM

## 2024-09-16 ENCOUNTER — Other Ambulatory Visit (HOSPITAL_BASED_OUTPATIENT_CLINIC_OR_DEPARTMENT_OTHER): Payer: Self-pay

## 2024-09-16 MED ORDER — LISDEXAMFETAMINE DIMESYLATE 70 MG PO CAPS
70.0000 mg | ORAL_CAPSULE | Freq: Every morning | ORAL | 0 refills | Status: AC
  Filled 2024-09-16: qty 90, 90d supply, fill #0

## 2024-09-20 NOTE — Progress Notes (Unsigned)
 " [facility] [address] [place]. [place] [address] [phone]       OFFICE FOLLOW UP VISIT NOTE  [name] Date of Birth:  01/05/1971 Medical Record Number:  [id number]    Primary neurologist: [name] Referring MD: [name], [name] Reason for Referral: Stroke like episode    Virtual Visit via Video Note  I connected with Natalie Ford on 09/20/24 at  8:15 AM EST by a video enabled telemedicine application and verified that I am speaking with the correct person using two identifiers.  Location: Patient: at work Provider: in office, [facility]   I discussed the limitations of evaluation and management by telemedicine and the availability of in person appointments. The patient expressed understanding and agreed to proceed.     HPI:    Update 09/21/2024 [name] for 43-month follow-up visit via MyChart video visit.      History provided for reference purposes only Update 03/16/2024 [name] for yearly follow-up visit via MyChart video visit.  Migraines were very well-controlled on propranolol  40 mg 3 times daily up until the last couple of months.  Previously experiencing migraines about twice per month and use of Nurtec with benefit but over the past couple of months, she reports about 15 migraine days per month, at times migraines can last several days in a row.  At times Nurtec will help but other times migraines will persist.  She is unsure if increased migraines are due from weather but also reports the rheumatologist tried to taper off prednisone  and has been off over the past couple months but recently restarted due to worsening RA.  Denies any change in characteristics or location of migraines.  She is currently on bupropion  for mood as well as gabapentin  400 mg daily.  Update 03/17/2023 JM: reports she has been doing well since prior visit.  She no longer is experiencing daily tension type headaches.  She  does experience migraine headache twice per month, use of Nurtec with resolution. Can worsen some months based on stress and weather changes.  Continues on propranolol  40 mg 3 times daily for preventative, occasionally will miss evening dose. No new neurological or stroke/TIA symptoms. Currently being followed by orthopedics for left hand numbness, gradually worsening, has EMG/NCV scheduled tomorrow and possible decompression based on results. Continues to follow with rheumatology for rheumatoid arthritis, has been gradually decreasing prednisone  dosage.  Update 08/13/2021 JM: Returns for follow-up visit after prior visit 3 months ago with [name] for prior strokelike episode possibly complicated migraine.  During the interval time, propranolol  further increased to 60 mg twice daily with some improvement. Use of Fioricet  for emergent relief but unable to tolerate due to increased fatigue after taking.  Currently experiencing almost daily dull tension type headaches in approximately 2-3 more severe headaches per month associated with phonophobia.  Denies photophobia or N/V.  Daily use of Tylenol  in the morning as she will frequently awaken with headache.  Prior sleep study 10/2020 negative for sleep apnea. She believes headaches have been worse since COVID back in September. She also gets Orencia  infusions for RA with bad headache after.  Denies new or reoccurring strokelike symptoms.  Compliant on aspirin  and Crestor without side effects.  Blood pressure today 115/77.  TCD unremarkable.  No further concerns at this time  Update 05/14/21 [name]: She returns for follow-up after last visit 7 months ago.  Patient was seen in the ER on 12/28/2020 with headaches and  fleeting left-sided paresthesias.  CT head was negative for acute abnormality.  She was given headache cocktail and symptoms resolved.  Patient was diagnosed with COVID infection was the third time on September 6 and is recovering from that.  She  continues to have migraine headaches which occur about 3-4 times a month.  She has been started on Inderal  30 mg twice daily for tachycardia which she has had post-COVID.  Her heart rate is still elevated at 95 today.  She is also been diagnosed with rheumatoid arthritis and started on Orencia  monthly injections.  She complains of occasional pins-and-needles in the feet at night but these are not bothersome or frequent.  Patient does not want any work-up for peripheral neuropathy at this time.  She denies any difficulty with balance or gait weakness.  She remains on aspirin  which is tolerating well without bruising or bleeding and Crestor which is tolerating without muscle aches and pains.  Initial visit 10/03/2020 Dr. Darra. Rybicki is a 54 year old Caucasian lady seen today for initial office consultation visit for strokelike episode.  History is obtained from the patient, review of electronic medical records and I personally reviewed available imaging films in PACS.  She has past medical history of hyperlipidemia, dyspepsia, irritable bowel syndrome, COVID-19 infection who presented on 08/05/2020 with sudden onset of right-sided weakness numbness right upper quadrant vision difficulties and spinning sensation.  His symptoms initially improved but she continued to have focal deficits since she was given IV TPA after discussion of risk benefit.  Her symptoms resolved after that.  She was kept in the neurological intensive care unit and blood pressure tightly controlled.  Subsequently in the CT angiogram and head and neck were obtained prior to TPA which were unremarkable.  Subsequently MRI scan of the brain was obtained which was normal.  2D echo showed ejection fraction of 55 to 60% without cardiac source of embolism.  LDL cholesterol was elevated at 1 1 7  mg percent and hemoglobin A1c was 4.8.  She is started on aspirin  and diet controlled for lipids.  Patient states that she has done well since discharge  she has had no recurrent episodes.  She has had a lot of muscle aches and pains and has been diagnosed with rheumatoid arthritis and is planning on starting Enbrel  soon.  She continues to have some blurred vision and has seen an ophthalmologist was prescribed Eysuvis eyedrops which she is using.  She also feels easy fatigability and feels a lot of brain fog.  She has no significant vascular risk factors except hyperlipidemia.  She is tolerating aspirin  well without bruising or bleeding.  She has no new complaints.  She denies any prior history of migraine headaches similar episodes in the past.          ROS:   14 system review of systems is positive for those listed in HPI and pains and all other systems negative  PMH:  Past Medical History:  Diagnosis Date   Allergic rhinitis    COVID-19    x 3   Dyspepsia    Hypercholesteremia    IBS (irritable bowel syndrome)    Lumbosacral strain    RA (rheumatoid arthritis) (HCC)    Stroke (HCC) 08/05/2020   tia    Social History:  Social History   Socioeconomic History   Marital status: Married    Spouse name: Not on file   Number of children: 2   Years of education: Not on file   Highest education  level: Not on file  Occupational History   Occupation: [employer], Psychologist, Forensic: [employer]   Occupation: [employer], BSN    Comment: [employer]  Tobacco Use   Smoking status: Never   Smokeless tobacco: Never   Tobacco comments:    exposed to second hand smoke  Vaping Use   Vaping status: Never Used  Substance and Sexual Activity   Alcohol use: Yes    Comment: social use   Drug use: No   Sexual activity: Not Currently  Other Topics Concern   Not on file  Social History Narrative   Lives with 2 children and husband   Right Handed   Drinks 1-2 cups caffeine  daily   Social Drivers of Health   Tobacco Use: Low Risk (08/05/2024)   Patient History    Smoking Tobacco Use: Never    Smokeless Tobacco Use:  Never    Passive Exposure: Not on file  Financial Resource Strain: Not on file  Food Insecurity: Not on file  Transportation Needs: Not on file  Physical Activity: Not on file  Stress: Not on file  Social Connections: Unknown (01/07/2022)   Received from [facility]   Social Network    Social Network: Not on file  Intimate Partner Violence: Unknown (11/28/2021)   Received from [facility]   HITS    Physically Hurt: Not on file    Insult or Talk Down To: Not on file    Threaten Physical Harm: Not on file    Scream or Curse: Not on file  Depression (PHQ2-9): Low Risk (12/19/2022)   Depression (PHQ2-9)    PHQ-2 Score: 0  Alcohol Screen: Not on file  Housing: Not on file  Utilities: Not on file  Health Literacy: Not on file    Medications:   Current Outpatient Medications on File Prior to Visit  Medication Sig Dispense Refill   albuterol  (VENTOLIN  HFA) 108 (90 Base) MCG/ACT inhaler Inhale 1-2 puffs into the lungs every 6 (six) hours as needed for wheezing or shortness of breath. 18 g 5   aspirin  EC 81 MG EC tablet Take 1 tablet (81 mg total) by mouth daily. Swallow whole. 30 tablet 11   Atogepant  (QULIPTA ) 60 MG TABS Take 1 tablet (60 mg total) by mouth daily. 30 tablet 11   buPROPion  (WELLBUTRIN  XL) 150 MG 24 hr tablet TAKE THREE TABLETS BY MOUTH EVERY MORNING 90 tablet 5   cycloSPORINE , PF, (CEQUA ) 0.09 % SOLN Place 1 drop into both eyes 2 times a day 180 each 3   estradiol -norethindrone  (COMBIPATCH ) 0.05-0.14 MG/DAY Apply 1 patch to the skin twice a week 24 patch 1   fluticasone  (FLONASE ) 50 MCG/ACT nasal spray Place 2 sprays into both nostrils daily.     fluticasone -salmeterol (ADVAIR  HFA) 230-21 MCG/ACT inhaler Inhale 2 puffs into the lungs 2 (two) times daily. 1 Inhaler 12   gabapentin  (NEURONTIN ) 100 MG capsule Take 2 capsules (200 mg total) by mouth 3 (three) times daily. 180 capsule 1   gabapentin  (NEURONTIN ) 400 MG capsule      lisdexamfetamine  (VYVANSE ) 70 MG capsule  Take 1 capsule (70 mg total) by mouth in the morning. 90 capsule 0   lisdexamfetamine  (VYVANSE ) 70 MG capsule Take 1 capsule (70 mg total) by mouth every morning. 90 capsule 0   Multiple Vitamin (MULTIVITAMIN) capsule Take 1 capsule by mouth at bedtime.     Omega-3 Fatty Acids (FISH OIL) 1000 MG CAPS Take 2 capsules by mouth daily.  predniSONE  (DELTASONE ) 1 MG tablet Take 2 mg by mouth daily with breakfast.     propranolol  (INDERAL ) 40 MG tablet TAKE 1 TABLET BY MOUTH 3 TIMES A DAY 270 tablet 0   Rimegepant Sulfate  (NURTEC) 75 MG TBDP Take 1 tablet (75 mg total) by mouth daily as needed (migriane headache). 8 tablet 11   rosuvastatin (CRESTOR) 10 MG tablet Take 10 mg by mouth daily.     Upadacitinib  ER (RINVOQ ) 15 MG TB24 1 tablet Orally Once a day 30 days 30 tablet 3   Current Facility-Administered Medications on File Prior to Visit  Medication Dose Route Frequency Provider Last Rate Last Admin   lidocaine  (XYLOCAINE ) 1 % (with pres) injection 3 mL  3 mL Other Once Raulkar, Sven SQUIBB, MD       sodium chloride  (PF) 0.9 % injection 2 mL  2 mL Intravenous PRN Raulkar, Sven SQUIBB, MD        Allergies:   Allergies  Allergen Reactions   Penicillins Rash    rash      Physical Exam General: Pleasant well developed, well nourished very pleasant middle-aged Caucasian lady, seated, in no evident distress  Neurologic Exam Mental Status: Awake and fully alert. Oriented to place and time. Recent and remote memory intact. Attention span, concentration and fund of knowledge appropriate. Mood and affect appropriate.        ASSESSMENT/PLAN: 54 year old Caucasian lady with transient episode of right-sided weakness numbness, spinning sensation and vision deficits, episode possibly complicated migraine treated with IV TPA with full recovery.  Brain imaging negative for acute stroke.  Vascular risk factors of hyperlipidemia only.  Episode of headache with fleeting left-sided paresthesias possibly  complicated migraine episode in May 2022.  Migraines greatly improved on propranolol  but notes increased frequency over the past 2 months with about 15 migraine days per month. She was tapered off prednisone  which she has been on for years for RA over the past 2 months possibly contributing to increased frequency and recently was restarted.     -Recommend initiating Qulipta  60 mg daily -Continue propranolol  40 mg 3 times daily  -Continue Nurtec 75 mg daily as needed - can consider trying [place] if remains ineffective -Would not recommend triptan due to strokelike episode s/p tPA -Hesitant to trial antidepressant such as tricyclic due to current use of high-dose bupropion , currently on gabapentin  400mg  daily  -Continue aspirin  81 mg daily and Crestor 10 mg daily for secondary stroke prevention measures -Continue to follow with PCP for aggressive stroke risk factor management    Follow-up in 6 months or call earlier if needed     CC:  Clarice Nottingham, MD   I personally spent a total of 25 minutes in the care of the patient today including preparing to see the patient, counseling and educating, placing orders, and documenting clinical information in the EHR.   Harlene Bogaert, AGNP-BC  [facility] [address] [place], [place] [address]  Phone [phone] Fax [phone] Note: This document was prepared with digital dictation and possible smart phrase technology. Any transcriptional errors that result from this process are unintentional.      "

## 2024-09-21 ENCOUNTER — Telehealth: Admitting: Adult Health

## 2024-09-21 ENCOUNTER — Telehealth: Payer: Self-pay | Admitting: Adult Health

## 2024-09-21 ENCOUNTER — Encounter: Payer: Self-pay | Admitting: Adult Health

## 2024-09-21 DIAGNOSIS — R2990 Unspecified symptoms and signs involving the nervous system: Secondary | ICD-10-CM | POA: Diagnosis not present

## 2024-09-21 DIAGNOSIS — G43709 Chronic migraine without aura, not intractable, without status migrainosus: Secondary | ICD-10-CM

## 2024-09-21 DIAGNOSIS — G43009 Migraine without aura, not intractable, without status migrainosus: Secondary | ICD-10-CM | POA: Diagnosis not present

## 2024-09-21 NOTE — Telephone Encounter (Signed)
-----   Message from Harlene Bogaert, NP sent at 09/21/2024  8:42 AM EST ----- Can you please start process for botox approval? Thank you!

## 2024-09-21 NOTE — Telephone Encounter (Signed)
 Submitted PA request via CMM, status is pending. Key: [id number]

## 2024-09-22 ENCOUNTER — Other Ambulatory Visit: Payer: Self-pay

## 2024-09-23 ENCOUNTER — Ambulatory Visit (HOSPITAL_BASED_OUTPATIENT_CLINIC_OR_DEPARTMENT_OTHER): Admitting: Orthopaedic Surgery

## 2024-09-26 ENCOUNTER — Other Ambulatory Visit: Payer: Self-pay

## 2024-09-26 ENCOUNTER — Other Ambulatory Visit (HOSPITAL_BASED_OUTPATIENT_CLINIC_OR_DEPARTMENT_OTHER): Payer: Self-pay

## 2024-09-27 ENCOUNTER — Other Ambulatory Visit: Payer: Self-pay

## 2024-09-27 ENCOUNTER — Other Ambulatory Visit: Payer: Self-pay | Admitting: Pharmacy Technician

## 2024-09-27 DIAGNOSIS — G43709 Chronic migraine without aura, not intractable, without status migrainosus: Secondary | ICD-10-CM

## 2024-09-27 MED ORDER — ONABOTULINUMTOXINA 200 UNITS IJ SOLR
155.0000 [IU] | INTRAMUSCULAR | 3 refills | Status: AC
  Filled 2024-09-27: qty 1, 90d supply, fill #0

## 2024-09-27 NOTE — Telephone Encounter (Signed)
 Botox  ordered, and sent to [facility].

## 2024-09-27 NOTE — Telephone Encounter (Signed)
 Auth was approved, please send rx for Botox  200 units to [facility]. Called pt and scheduled for 3/19 @ 12:45 pm; informed pt of Botox  Savings Program.  Auth#: [id number] (09/21/24-03/20/25)

## 2024-09-29 ENCOUNTER — Other Ambulatory Visit: Payer: Self-pay

## 2024-11-10 ENCOUNTER — Ambulatory Visit: Admitting: Adult Health
